# Patient Record
Sex: Female | Born: 1955 | Race: White | Hispanic: No | Marital: Single | Smoking: Light tobacco smoker
Health system: Southern US, Community
[De-identification: ages and names within clinical notes are randomized; demographics above are authoritative.]

## PROBLEM LIST (undated history)

## (undated) DIAGNOSIS — F329 Major depressive disorder, single episode, unspecified: Secondary | ICD-10-CM

## (undated) DIAGNOSIS — F418 Other specified anxiety disorders: Secondary | ICD-10-CM

## (undated) DIAGNOSIS — M1990 Unspecified osteoarthritis, unspecified site: Secondary | ICD-10-CM

## (undated) DIAGNOSIS — G4700 Insomnia, unspecified: Secondary | ICD-10-CM

## (undated) DIAGNOSIS — Z720 Tobacco use: Secondary | ICD-10-CM

## (undated) DIAGNOSIS — C55 Malignant neoplasm of uterus, part unspecified: Secondary | ICD-10-CM

## (undated) DIAGNOSIS — I10 Essential (primary) hypertension: Secondary | ICD-10-CM

## (undated) DIAGNOSIS — R51 Headache: Secondary | ICD-10-CM

## (undated) DIAGNOSIS — M109 Gout, unspecified: Secondary | ICD-10-CM

## (undated) DIAGNOSIS — F32A Depression, unspecified: Secondary | ICD-10-CM

## (undated) DIAGNOSIS — K219 Gastro-esophageal reflux disease without esophagitis: Secondary | ICD-10-CM

## (undated) DIAGNOSIS — Z8541 Personal history of malignant neoplasm of cervix uteri: Secondary | ICD-10-CM

## (undated) DIAGNOSIS — F1411 Cocaine abuse, in remission: Secondary | ICD-10-CM

## (undated) HISTORY — DX: Personal history of malignant neoplasm of cervix uteri: Z85.41

## (undated) HISTORY — DX: Depression, unspecified: F32.A

## (undated) HISTORY — DX: Other specified anxiety disorders: F41.8

## (undated) HISTORY — DX: Tobacco use: Z72.0

## (undated) HISTORY — DX: Gout, unspecified: M10.9

## (undated) HISTORY — DX: Gastro-esophageal reflux disease without esophagitis: K21.9

## (undated) HISTORY — DX: Essential (primary) hypertension: I10

## (undated) HISTORY — DX: Malignant neoplasm of uterus, part unspecified: C55

## (undated) HISTORY — DX: Insomnia, unspecified: G47.00

## (undated) HISTORY — DX: Major depressive disorder, single episode, unspecified: F32.9

## (undated) HISTORY — PX: PARTIAL HYSTERECTOMY: SHX80

## (undated) HISTORY — DX: Unspecified osteoarthritis, unspecified site: M19.90

## (undated) HISTORY — PX: SKIN GRAFT: SHX250

## (undated) HISTORY — DX: Cocaine abuse, in remission: F14.11

---

## 1983-05-09 DIAGNOSIS — Z8541 Personal history of malignant neoplasm of cervix uteri: Secondary | ICD-10-CM

## 1983-05-09 HISTORY — DX: Personal history of malignant neoplasm of cervix uteri: Z85.41

## 1998-01-07 ENCOUNTER — Emergency Department (HOSPITAL_COMMUNITY): Admission: EM | Admit: 1998-01-07 | Discharge: 1998-01-07 | Payer: Self-pay | Admitting: Emergency Medicine

## 1998-01-07 ENCOUNTER — Encounter: Payer: Self-pay | Admitting: Emergency Medicine

## 1998-12-12 ENCOUNTER — Emergency Department (HOSPITAL_COMMUNITY): Admission: EM | Admit: 1998-12-12 | Discharge: 1998-12-12 | Payer: Self-pay | Admitting: Emergency Medicine

## 1999-12-12 ENCOUNTER — Emergency Department (HOSPITAL_COMMUNITY): Admission: EM | Admit: 1999-12-12 | Discharge: 1999-12-12 | Payer: Self-pay | Admitting: Emergency Medicine

## 1999-12-19 ENCOUNTER — Emergency Department (HOSPITAL_COMMUNITY): Admission: EM | Admit: 1999-12-19 | Discharge: 1999-12-19 | Payer: Self-pay | Admitting: Emergency Medicine

## 2005-05-08 DIAGNOSIS — I10 Essential (primary) hypertension: Secondary | ICD-10-CM

## 2005-05-08 HISTORY — DX: Essential (primary) hypertension: I10

## 2005-08-26 ENCOUNTER — Inpatient Hospital Stay (HOSPITAL_COMMUNITY): Admission: EM | Admit: 2005-08-26 | Discharge: 2005-08-27 | Payer: Self-pay | Admitting: Emergency Medicine

## 2008-05-08 DIAGNOSIS — F418 Other specified anxiety disorders: Secondary | ICD-10-CM

## 2008-05-08 HISTORY — DX: Other specified anxiety disorders: F41.8

## 2008-09-30 ENCOUNTER — Emergency Department (HOSPITAL_COMMUNITY): Admission: EM | Admit: 2008-09-30 | Discharge: 2008-09-30 | Payer: Self-pay | Admitting: Family Medicine

## 2008-10-01 ENCOUNTER — Emergency Department (HOSPITAL_COMMUNITY): Admission: EM | Admit: 2008-10-01 | Discharge: 2008-10-01 | Payer: Self-pay | Admitting: Emergency Medicine

## 2008-10-15 ENCOUNTER — Encounter (INDEPENDENT_AMBULATORY_CARE_PROVIDER_SITE_OTHER): Payer: Self-pay | Admitting: Internal Medicine

## 2008-10-23 ENCOUNTER — Ambulatory Visit: Payer: Self-pay | Admitting: Internal Medicine

## 2008-10-23 ENCOUNTER — Ambulatory Visit (HOSPITAL_COMMUNITY): Admission: RE | Admit: 2008-10-23 | Discharge: 2008-10-23 | Payer: Self-pay | Admitting: Internal Medicine

## 2008-10-23 ENCOUNTER — Encounter (INDEPENDENT_AMBULATORY_CARE_PROVIDER_SITE_OTHER): Payer: Self-pay | Admitting: Internal Medicine

## 2008-10-23 DIAGNOSIS — R0989 Other specified symptoms and signs involving the circulatory and respiratory systems: Secondary | ICD-10-CM | POA: Insufficient documentation

## 2008-10-23 DIAGNOSIS — C539 Malignant neoplasm of cervix uteri, unspecified: Secondary | ICD-10-CM | POA: Insufficient documentation

## 2008-10-23 DIAGNOSIS — Z9189 Other specified personal risk factors, not elsewhere classified: Secondary | ICD-10-CM | POA: Insufficient documentation

## 2008-10-23 DIAGNOSIS — R0609 Other forms of dyspnea: Secondary | ICD-10-CM | POA: Insufficient documentation

## 2008-10-23 DIAGNOSIS — F418 Other specified anxiety disorders: Secondary | ICD-10-CM | POA: Insufficient documentation

## 2008-10-23 DIAGNOSIS — F17200 Nicotine dependence, unspecified, uncomplicated: Secondary | ICD-10-CM | POA: Insufficient documentation

## 2008-10-23 DIAGNOSIS — K219 Gastro-esophageal reflux disease without esophagitis: Secondary | ICD-10-CM | POA: Insufficient documentation

## 2008-10-23 DIAGNOSIS — F1411 Cocaine abuse, in remission: Secondary | ICD-10-CM

## 2008-10-23 DIAGNOSIS — I10 Essential (primary) hypertension: Secondary | ICD-10-CM | POA: Insufficient documentation

## 2008-10-23 DIAGNOSIS — G4700 Insomnia, unspecified: Secondary | ICD-10-CM | POA: Insufficient documentation

## 2008-10-23 HISTORY — DX: Cocaine abuse, in remission: F14.11

## 2010-05-31 ENCOUNTER — Emergency Department (HOSPITAL_COMMUNITY)
Admission: EM | Admit: 2010-05-31 | Discharge: 2010-05-31 | Payer: Self-pay | Source: Home / Self Care | Admitting: Emergency Medicine

## 2010-06-01 LAB — POCT I-STAT, CHEM 8
BUN: 12 mg/dL (ref 6–23)
Calcium, Ion: 1.22 mmol/L (ref 1.12–1.32)
Chloride: 105 mEq/L (ref 96–112)
Creatinine, Ser: 1 mg/dL (ref 0.4–1.2)
Glucose, Bld: 105 mg/dL — ABNORMAL HIGH (ref 70–99)
HCT: 46 % (ref 36.0–46.0)
Hemoglobin: 15.6 g/dL — ABNORMAL HIGH (ref 12.0–15.0)
Potassium: 4.4 mEq/L (ref 3.5–5.1)
Sodium: 139 mEq/L (ref 135–145)
TCO2: 28 mmol/L (ref 0–100)

## 2010-06-07 NOTE — Assessment & Plan Note (Signed)
Summary: ACUTE-PT TO SEE [facility]/REF BY URGENT CAR OKAY PER DORIS/[facility]   Vital Signs:  Patient profile:   55 year old female Height:      62 inches (157.48 cm) Weight:      171.1 pounds (77.77 kg) BMI:     31.41 Temp:     97.9 degrees F Pulse rate:   72 / minute BP sitting:   167 / 94  (right arm) Cuff size:   regular  Vitals Entered By: Dorie Rank RN (October 23, 2008 9:59 AM) CC: ED and HFU - gets so hot at night and sweating, gets h/a most nights - "so stressed ...my nerves are shot" - sister's dtr was murdered in Dec.and still dealing with stress- has not been able to find MD to help her get her b/p under control  Is Patient Diabetic? No Pain Assessment Patient in pain? yes     Location: headache Intensity: 7 Type: throbbing Nutritional Status BMI of > 30 = obese  Does patient need assistance? Functional Status Self care Ambulation Normal Comments nerves shot because [employer] gave her wrong pills and she took 2 pills and then ended up in ED   CC:  ED and HFU - gets so hot at night and sweating and gets h/a most nights - "so stressed ...my nerves are shot" - sister's dtr was murdered in Dec.and still dealing with stress- has not been able to find MD to help her get her b/p under control .  History of Present Illness: Lori [name] is a 55 yo lady is new to the clinic.Her PMH is significant for HTN crisis for which she was admitted on 2007, 33 years of tobacco abuse, h/o cocaine abuse, cervical cancer s/p hysterectomy at the age of 55, GERD and a recent ED visit for adverse drug reaction.   1) HTN: Pt. was first diagnosed with HTN in 2007, when she was admitted for HTN crisis. Pt. was initially started on 2 meds when she left the hospital. She took these meds probably for a month. Pt. was started on clonidine few wks ago when she went to urgent care. According to the pt. and her daughter's description, she was given a wrong medication. After taking this medication she had abnormal  reaction- she had rash on her arms, face, neck and upper torso. She was also short of breath and was feeling sick. She came to ED and she brought her meds and a/t ED note that was lisinopril-hctz. Since then she started taking clonidine. It makes the pt. sick and dizzy and lose her balance. But, eventually these symptom wears off. She doesn't have any rash now.   2) Tobacco abuse: She currently smokes half a pack per day.   3) GERD: It's very bad. She can't eat anything. It burns on her LUQ.   4) Cocaine abuse: A/t the pt. this was back in old days and she no longer uses it. Last documented use was in 2007 when she came with HTN crisis.   5) Cervical cancer: Pt. is not following any GYN MD regularly.   6) Anxiety: Pt. had 5 deaths in last 2 years. She lost her father, brother, uncle. Her sister's daughter was stabbed. Last death was his father about a year ago. SHe is not sure what to do. She stays in the room and watch TV all day long. Pt. has no motivation to do anything. Pt. used to enjoy fishing and still does. Her mood is not good. She cries  sometimes. No SI/HI. 7) Insomnia: Pt. has problem going to sleep, staying asleep and early morning awakeinings. Her typical night is as follow. She takes her dinner at 4-5 PM. At night she go to bed at 9 AM and watch TV from bed. Her hands and legs go to sleep while she is in bed. Pt. can't go to sleep. She tosses and turns. She gets HA as she is stressed and tired. Then she tries to turn tv off. She wakes up multiple times at night to go to bathroom as she drinks a lot of water. She may eventually get sleep for short time. Pt's daughter said the pt. snores really loud and bad. Pt. walks about a mile every day at 7 PM before she goes to sleep. Pt. doesn't drink alchohal. No other substance. Pt. feels like she needs to take nap at daytime as she didn't get enought sleep at night. But pt. doesn't take nap.   Preventive Screening-Counseling &  Management  Alcohol-Tobacco     Smoking Status: current     Smoking Cessation Counseling: yes     Packs/Day: 0.5  Comments: has reduced smoking by 1/2 ppd- wants to quit smoking  Current Medications (verified): 1)  Prilosec 40 Mg Cpdr (Omeprazole) .... Take 1 Pill By Mouth Daily. 2)  Norvasc 5 Mg Tabs (Amlodipine Besylate) .... Take 1 Pill By Mouth Daily. 3)  Ventolin Hfa 108 (90 Base) Mcg/act Aers (Albuterol Sulfate) .... Use 2 Puffs As Needed For Any Difficulty Breathing Up To Every 4 Hourly. 4)  Spiriva Handihaler 18 Mcg Caps (Tiotropium Bromide Monohydrate) .... Use 1 Capsule For Inhalation Every Day. 5)  Amitriptyline Hcl 25 Mg Tabs (Amitriptyline Hcl) .... Take 1 Tablet At Bedtime 6)  Nicoderm Cq 14 Mg/24hr Pt24 (Nicotine) .... Apply 1 Patch Daily.  Allergies (verified): 1)  ! Zestoretic (Lisinopril-Hydrochlorothiazide)  Social History: Smoking Status:  current Packs/Day:  0.5  Review of Systems      See HPI  Physical Exam  General:  alert.   Mouth:  pharynx pink and moist.   Lungs:  normal breath sounds and no crackles.  Scattered wheeze on both lungs.  Heart:  normal rate, regular rhythm, no murmur, no gallop, and no rub.   Abdomen:  soft, normal bowel sounds, no distention, no masses, no guarding, no rigidity, and no rebound tenderness.  Mild epigastric tenderness.  Extremities:  trace left pedal edema and trace right pedal edema.   Neurologic:  alert & oriented X3.   Psych:  memory intact for recent and remote, normally interactive, good eye contact, not depressed appearing, not agitated, and slightly anxious.     Impression & Recommendations:  Problem # 1:  GERD (ICD-530.81) I will treat this with Omeprazole.  Her updated medication list for this problem includes:    Prilosec 40 Mg Cpdr (Omeprazole) .Marland Kitchen... Take 1 pill by mouth daily.  Problem # 2:  SNORING (ICD-786.09) Pt. has insomnia, daytime sleepiness and daughter noticed pt. snores v. loudly. Will get  followings.  Her updated medication list for this problem includes:    Ventolin Hfa 108 (90 Base) Mcg/act Aers (Albuterol sulfate) ..... Use 2 puffs as needed for any difficulty breathing up to every 4 hourly.    Spiriva Handihaler 18 Mcg Caps (Tiotropium bromide monohydrate) ..... Use 1 capsule for inhalation every day.  Orders: Diagnostic Polysomnogram (Diagnostic Polysomno)  Problem # 3:  CERVICAL CANCER (ICD-180.9) Pt. hadn't had any f/u for this. She is s/p hysterectomy at the age  of 28. After pt's other active issues are settled, I will refer her to GYN.  Orders: T-Comprehensive Metabolic Panel ([id number])  Problem # 4:  COCAINE ABUSE, HX OF (ICD-V15.9) I will check UDS today. She said she used cocaine only remotely.  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 Her 12 lead EKG is WNL. I will treat this will low dose amitryptiiline. This will also help her sleep. Will follow closely and if needed we will increase the dose in 4-6 wks.  Her updated medication list for this problem includes:    Amitriptyline Hcl 25 Mg Tabs (Amitriptyline hcl) .Marland Kitchen... Take 1 tablet at bedtime  Orders: 12 Lead EKG (12 Lead EKG)  Problem # 6:  TOBACCO ABUSE (ICD-305.1) SPoke with the pt. at length about the importance of smoking cessation. I would be reluctant to start her on chantix now because she is little depressed but not suicidal though. I will try nicotine patch. If she has problem affording this and her depression improves, I will start chantix, as she can get it from [facility]. Pt. has some wheezing and longstanding h/o smoking. She also had some COPD like changes on CXR. I will get PFT when her acute/subacute problems are under control. For now I will start her on spiriva and as needed albuterol.  Her updated medication list for this problem includes:    Nicoderm Cq 14 Mg/24hr Pt24 (Nicotine) .Marland Kitchen... Apply 1 patch  daily.  Problem # 7:  INSOMNIA (ICD-780.52)  This is multifactorial. I think the pt. needs to improve her sleep hygeine. I had v. long discussion regarding this. She should move her TV out of her bedroom. I also encouraged her not to exercise before she goes to bed: no empty stomach while she goes to bed. No reading, eating, watching TV from bed. Quitting smoking would also help. I am starting amitryptilline for depression and hopefully this will also help for sleep. Will reassess this on next visit.   Orders: T-TSH ([id number])  Problem # 8:  HYPERTENSION (ICD-401.9) I d/ced pt's clonidine, as she is no longer taking it. Pt. is allergic to hctz-lisinopril combo pill. I will treat this with norvasc. Discussed about low salt and wt. loss. Pt. didn't go to lab for CMET, TSH and UDS. This should be checked on next visit in 2 wks. If BP is not controlled optimally by then, I will increase the dose of norvasc to 10 mg.  Her updated medication list for this problem includes:    Norvasc 5 Mg Tabs (Amlodipine besylate) .Marland Kitchen... Take 1 pill by mouth daily.  Orders: T-Comprehensive Metabolic Panel [id number]) T-TSH [phone])  BP today: 167/94  Complete Medication List: 1)  Prilosec 40 Mg Cpdr (Omeprazole) .... Take 1 pill by mouth daily. 2)  Norvasc 5 Mg Tabs (Amlodipine besylate) .... Take 1 pill by mouth daily. 3)  Ventolin Hfa 108 (90 Base) Mcg/act Aers (Albuterol sulfate) .... Use 2 puffs as needed for any difficulty breathing up to every 4 hourly. 4)  Spiriva Handihaler 18 Mcg Caps (Tiotropium bromide monohydrate) .... Use 1 capsule for inhalation every day. 5)  Amitriptyline Hcl 25 Mg Tabs (Amitriptyline hcl) .... Take 1 tablet at bedtime 6)  Nicoderm Cq 14 Mg/24hr Pt24 (Nicotine) .... Apply 1 patch daily.  Patient Instructions: 1)  Don't smoke when you are using nicotine patch.  2)  Please schedule a follow-up appointment in 2 weeks. 3)  Limit your Sodium (  Salt). 4)  Limit your  Sodium (Salt) to less than 2 grams a day(slightly less than 1/2 a teaspoon) to prevent fluid retention, swelling, or worsening of symptoms. 5)  Tobacco is very bad for your health and your loved ones! You Should stop smoking!. 6)  Stop Smoking Tips: Choose a Quit date. Cut down before the Quit date. decide what you will do as a substitute when you feel the urge to smoke(gum,toothpick,exercise). 7)  It is important that you exercise regularly at least 20 minutes 5 times a week. If you develop chest pain, have severe difficulty breathing, or feel very tired , stop exercising immediately and seek medical attention. 8)  You need to lose weight. Consider a lower calorie diet and regular exercise.  9)  Check your Blood Pressure regularly. If it is above: you should make an appointment. Prescriptions: NICODERM CQ 14 MG/24HR PT24 (NICOTINE) apply 1 patch daily.  #30 x 6   Entered and Authorized by:   Jason Coop MD   Signed by:   Jason Coop MD on 10/23/2008   Method used:   Print then Give to Patient   RxID:   [id number] AMITRIPTYLINE HCL 25 MG TABS (AMITRIPTYLINE HCL) take 1 tablet at bedtime  #30 x 0   Entered and Authorized by:   Jason Coop MD   Signed by:   Jason Coop MD on 10/23/2008   Method used:   Print then Give to Patient   RxID:   [id number] SPIRIVA HANDIHALER 18 MCG CAPS (TIOTROPIUM BROMIDE MONOHYDRATE) use 1 capsule for inhalation every day.  #1 x 11   Entered and Authorized by:   Jason Coop MD   Signed by:   Jason Coop MD on 10/23/2008   Method used:   Print then Give to Patient   RxID:   [id number] VENTOLIN HFA 108 (90 BASE) MCG/ACT AERS (ALBUTEROL SULFATE) use 2 puffs as needed for any difficulty breathing up to every 4 hourly.  #1 x prn   Entered and Authorized by:   Jason Coop MD   Signed by:   Jason Coop MD on 10/23/2008   Method used:   Print then Give to Patient   RxID:   [id number] NORVASC  5 MG TABS (AMLODIPINE BESYLATE) take 1 pill by mouth daily.  #30 x 0   Entered and Authorized by:   Jason Coop MD   Signed by:   Jason Coop MD on 10/23/2008   Method used:   Print then Give to Patient   RxID:   [id number] PRILOSEC 40 MG CPDR (OMEPRAZOLE) take 1 pill by mouth daily.  #30 x 5   Entered and Authorized by:   Jason Coop MD   Signed by:   Jason Coop MD on 10/23/2008   Method used:   Print then Give to Patient   RxID:   [id number]

## 2010-06-07 NOTE — Miscellaneous (Signed)
Summary: HIPAA Restrictions  HIPAA Restrictions   Imported By: Florinda Marker 10/23/2008 15:58:10  _____________________________________________________________________  External Attachment:    Type:   Image     Comment:   External Document

## 2010-06-20 ENCOUNTER — Other Ambulatory Visit (HOSPITAL_COMMUNITY)
Admission: RE | Admit: 2010-06-20 | Discharge: 2010-06-20 | Disposition: A | Discharge status: Home / Self Care | Payer: Self-pay | Source: Ambulatory Visit | Attending: Internal Medicine | Admitting: Internal Medicine

## 2010-06-20 ENCOUNTER — Encounter: Payer: Self-pay | Admitting: Internal Medicine

## 2010-06-20 ENCOUNTER — Ambulatory Visit (INDEPENDENT_AMBULATORY_CARE_PROVIDER_SITE_OTHER): Payer: Self-pay | Admitting: Internal Medicine

## 2010-06-20 DIAGNOSIS — G4700 Insomnia, unspecified: Secondary | ICD-10-CM

## 2010-06-20 DIAGNOSIS — I10 Essential (primary) hypertension: Secondary | ICD-10-CM

## 2010-06-20 DIAGNOSIS — M79609 Pain in unspecified limb: Secondary | ICD-10-CM

## 2010-06-20 DIAGNOSIS — F329 Major depressive disorder, single episode, unspecified: Secondary | ICD-10-CM

## 2010-06-20 DIAGNOSIS — Z01419 Encounter for gynecological examination (general) (routine) without abnormal findings: Secondary | ICD-10-CM | POA: Insufficient documentation

## 2010-06-20 DIAGNOSIS — C539 Malignant neoplasm of cervix uteri, unspecified: Secondary | ICD-10-CM

## 2010-06-20 DIAGNOSIS — K759 Inflammatory liver disease, unspecified: Secondary | ICD-10-CM

## 2010-06-20 DIAGNOSIS — F17200 Nicotine dependence, unspecified, uncomplicated: Secondary | ICD-10-CM

## 2010-06-20 DIAGNOSIS — K219 Gastro-esophageal reflux disease without esophagitis: Secondary | ICD-10-CM

## 2010-06-20 DIAGNOSIS — M79606 Pain in leg, unspecified: Secondary | ICD-10-CM

## 2010-06-20 LAB — COMPREHENSIVE METABOLIC PANEL
ALT: 11 U/L (ref 0–35)
AST: 15 U/L (ref 0–37)
Albumin: 4.7 g/dL (ref 3.5–5.2)
Alkaline Phosphatase: 73 U/L (ref 39–117)
BUN: 13 mg/dL (ref 6–23)
CO2: 26 mEq/L (ref 19–32)
Calcium: 9.9 mg/dL (ref 8.4–10.5)
Chloride: 103 mEq/L (ref 96–112)
Creat: 0.9 mg/dL (ref 0.40–1.20)
Glucose, Bld: 99 mg/dL (ref 70–99)
Potassium: 4.8 mEq/L (ref 3.5–5.3)
Sodium: 140 mEq/L (ref 135–145)
Total Bilirubin: 0.4 mg/dL (ref 0.3–1.2)
Total Protein: 7.6 g/dL (ref 6.0–8.3)

## 2010-06-20 LAB — TSH: TSH: 0.758 u[IU]/mL (ref 0.350–4.500)

## 2010-06-20 MED ORDER — ZOLPIDEM TARTRATE 10 MG PO TABS: 10.0000 mg | ORAL_TABLET | Freq: Every evening | ORAL | Status: DC | PRN

## 2010-06-20 MED ORDER — OMEPRAZOLE 40 MG PO CPDR: 40.0000 mg | DELAYED_RELEASE_CAPSULE | Freq: Every day | ORAL | Status: DC

## 2010-06-20 MED ORDER — LISINOPRIL 20 MG PO TABS
20.0000 mg | ORAL_TABLET | Freq: Every day | ORAL | Status: DC
Start: 2010-06-20 — End: 2010-10-25

## 2010-06-20 MED ORDER — HYDROCHLOROTHIAZIDE 12.5 MG PO CAPS: 12.5000 mg | ORAL_CAPSULE | Freq: Every day | ORAL | Status: DC

## 2010-06-20 MED ORDER — FLUOXETINE HCL (PMDD) 20 MG PO CAPS: 20.0000 mg | ORAL_CAPSULE | Freq: Every day | ORAL | Status: DC

## 2010-06-20 NOTE — Assessment & Plan Note (Signed)
That nausea  And episodes of emesis with abdominal pain are more likely due to GERD.  Patient was not taking omeprazole as prescribed in 2010.  I am considering to restart it.  To evaluate at the next office visit if symptoms improved. Also checking liver function test.

## 2010-06-20 NOTE — Patient Instructions (Addendum)
Call if you are no feeling better. Make an appointment with Dr Loistine Chance in 1 month.
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
## 2010-06-20 NOTE — Assessment & Plan Note (Signed)
His blood pressures was elevated at this office visit. I think there is a lot of anxiety which caused the elevated  Blood pressure. Therefore I will not change dosage at this time I will continue lisinopril and hydrochlorothiazide  at the same dose.  Consider to increase the dosage if blood pressure continues to be elevated. Patient had a history of hypertensive urgency in 2007 needed 2 blood pressure medications to control  her pressure is.

## 2010-06-20 NOTE — Assessment & Plan Note (Signed)
Patient noted hip pain and knee pain especially during the night she was using good he called her and Tylenol with no significant improvement. I discussed with the patient that this should be addressed at the next office visit since the depression is insomnia  I think was the main problem during this office visit. I noted that she needs to call if her pain worsen significantly. I would consider possible  Neurontin and imaging studies. Patient noted that her sister has arthritis.

## 2010-06-20 NOTE — Assessment & Plan Note (Signed)
I think is  Multifactorial.  Likely due to depression contribution to most do it. But also her sleep hygiene. I discussed with her improvement in her sleep hygiene. Furthermore I am considering that patient may have sleep apnea. She was referred in June of 2010 for a sleep study especially in the setting of severe snoring and episodes of apnea during  the night  but patient did not followup.  I will start her on Ambien and fluoxetine. I will refer the patient at the next office visit for his sleep study and manage accordingly.

## 2010-06-20 NOTE — Assessment & Plan Note (Signed)
Patient noted that her husband has hepatitis  but unclear what kind. I will check a hepatitis panel today and manage accordingly.

## 2010-06-20 NOTE — Assessment & Plan Note (Signed)
I think the patient has depression and anxiety. A lot of things are going on in her life. She discontinued amitriptyline 1-1/2 years ago I would start her on fluoxetine. This will also help her sleep. We'll followup closely and if needed we'll increase her dose. I recommended to take for 6 days half a pill and then increases to full dose. I advised her to call if she is not feeling better in 2 weeks.

## 2010-06-20 NOTE — Assessment & Plan Note (Addendum)
Patient had a history of subacute cancer at age 55 status post  partial hysterectomy.  Never followed up with OB/GYN. Last Pap smear 10 years ago. Will do Pap smear today and will refer patient to OB/GYN  at the next office visit.  Will refer patient to next office visit for a mammogram and bone scan.  The pap smear showed atypical squamous cells of undetermined significance (ASC-US). Will refer patient to OB/GYN.

## 2010-06-20 NOTE — Assessment & Plan Note (Signed)
Patient has a 35 year history of tobacco abuse.  She would like to quit. She was prescribed nicotine patches in 2010 but she was no longer able to  afford it.  She is currently smoking half a pack a day. She will she would like to quit and would like to have help.  I will refer the patient to  Social worker for smoking cessation.

## 2010-06-21 LAB — CBC
HCT: 38.4 % (ref 36.0–46.0)
Hemoglobin: 12.7 g/dL (ref 12.0–15.0)
MCH: 31.6 pg (ref 26.0–34.0)
MCHC: 33.1 g/dL (ref 30.0–36.0)
MCV: 95.5 fL (ref 78.0–100.0)
Platelets: 325 10*3/uL (ref 150–400)
RBC: 4.02 MIL/uL (ref 3.87–5.11)
RDW: 11.8 % (ref 11.5–15.5)
WBC: 8.8 10*3/uL (ref 4.0–10.5)

## 2010-06-21 LAB — HEPATITIS PANEL, ACUTE
HCV Ab: NEGATIVE
Hep A IgM: NEGATIVE
Hep B C IgM: NEGATIVE
Hepatitis B Surface Ag: NEGATIVE

## 2010-06-22 ENCOUNTER — Telehealth: Payer: Self-pay | Admitting: Licensed Clinical Social Worker

## 2010-06-22 NOTE — Telephone Encounter (Signed)
Pt scheduled for Monday 2/20 at noon for smoking cessation

## 2010-06-27 ENCOUNTER — Encounter: Payer: Self-pay | Admitting: Licensed Clinical Social Worker
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
## 2010-06-27 NOTE — Progress Notes (Signed)
Addended by: Almyra Deforest on: 06/27/2010 03:07 PM   Modules accepted: Orders

## 2010-06-30 NOTE — Progress Notes (Signed)
Completed.

## 2010-07-05 ENCOUNTER — Telehealth: Payer: Self-pay | Admitting: Licensed Clinical Social Worker

## 2010-07-05 NOTE — Telephone Encounter (Signed)
Patient called and said she never heard from the Quitline.  I called the Quitline and they advised that she call the Quitline again to reenroll.  I called the patient and apologized while encouraging her to reenroll so she can obtain counseling and free patches.

## 2010-07-20 ENCOUNTER — Other Ambulatory Visit: Payer: Self-pay | Admitting: *Deleted

## 2010-07-20 DIAGNOSIS — F329 Major depressive disorder, single episode, unspecified: Secondary | ICD-10-CM

## 2010-07-20 DIAGNOSIS — G4700 Insomnia, unspecified: Secondary | ICD-10-CM

## 2010-07-20 MED ORDER — ZOLPIDEM TARTRATE 10 MG PO TABS: 10.0000 mg | ORAL_TABLET | Freq: Every evening | ORAL | Status: DC | PRN

## 2010-07-20 MED ORDER — FLUOXETINE HCL (PMDD) 20 MG PO CAPS: 20.0000 mg | ORAL_CAPSULE | Freq: Every day | ORAL | Status: DC

## 2010-07-20 NOTE — Telephone Encounter (Signed)
Call to [facility] for refills on Ambien and Fluoxetine 20 mg tablets per order of Dr. Aundria Rud

## 2010-07-29 ENCOUNTER — Other Ambulatory Visit: Payer: Self-pay | Admitting: Advanced Practice Midwife

## 2010-07-29 ENCOUNTER — Encounter (INDEPENDENT_AMBULATORY_CARE_PROVIDER_SITE_OTHER): Payer: Self-pay | Admitting: Advanced Practice Midwife

## 2010-07-29 ENCOUNTER — Other Ambulatory Visit: Payer: Self-pay | Admitting: Obstetrics & Gynecology

## 2010-07-29 DIAGNOSIS — N949 Unspecified condition associated with female genital organs and menstrual cycle: Secondary | ICD-10-CM

## 2010-07-29 DIAGNOSIS — R87619 Unspecified abnormal cytological findings in specimens from cervix uteri: Secondary | ICD-10-CM

## 2010-07-29 DIAGNOSIS — R1031 Right lower quadrant pain: Secondary | ICD-10-CM
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
## 2010-08-08 ENCOUNTER — Ambulatory Visit (HOSPITAL_COMMUNITY): Admission: RE | Admit: 2010-08-08 | Payer: Self-pay | Source: Ambulatory Visit

## 2010-08-08 ENCOUNTER — Ambulatory Visit (HOSPITAL_COMMUNITY): Payer: Self-pay | Attending: Obstetrics & Gynecology

## 2010-08-15 ENCOUNTER — Ambulatory Visit: Payer: Self-pay | Admitting: Family Medicine

## 2010-08-16 LAB — POCT I-STAT, CHEM 8
BUN: 12 mg/dL (ref 6–23)
Calcium, Ion: 1.19 mmol/L (ref 1.12–1.32)
Chloride: 105 mEq/L (ref 96–112)
Creatinine, Ser: 1.2 mg/dL (ref 0.4–1.2)
Glucose, Bld: 127 mg/dL — ABNORMAL HIGH (ref 70–99)
HCT: 47 % — ABNORMAL HIGH (ref 36.0–46.0)
Hemoglobin: 16 g/dL — ABNORMAL HIGH (ref 12.0–15.0)
Potassium: 3.7 mEq/L (ref 3.5–5.1)
Sodium: 139 mEq/L (ref 135–145)
TCO2: 26 mmol/L (ref 0–100)

## 2010-08-16 LAB — POCT URINALYSIS DIP (DEVICE)
Bilirubin Urine: NEGATIVE
Glucose, UA: NEGATIVE mg/dL
Nitrite: NEGATIVE
Protein, ur: NEGATIVE mg/dL
Specific Gravity, Urine: 1.015 (ref 1.005–1.030)
Urobilinogen, UA: 0.2 mg/dL (ref 0.0–1.0)
pH: 5.5 (ref 5.0–8.0)

## 2010-08-22 ENCOUNTER — Ambulatory Visit (HOSPITAL_COMMUNITY): Admission: RE | Admit: 2010-08-22 | Payer: Self-pay | Source: Ambulatory Visit

## 2010-08-30 ENCOUNTER — Ambulatory Visit (HOSPITAL_COMMUNITY): Payer: Self-pay

## 2010-08-31 ENCOUNTER — Other Ambulatory Visit: Payer: Self-pay | Admitting: *Deleted

## 2010-08-31 DIAGNOSIS — F329 Major depressive disorder, single episode, unspecified: Secondary | ICD-10-CM

## 2010-08-31 DIAGNOSIS — G4700 Insomnia, unspecified: Secondary | ICD-10-CM

## 2010-09-02 MED ORDER — ZOLPIDEM TARTRATE 10 MG PO TABS: 10.0000 mg | ORAL_TABLET | Freq: Every evening | ORAL | Status: DC | PRN

## 2010-09-02 MED ORDER — FLUOXETINE HCL (PMDD) 20 MG PO CAPS: 20.0000 mg | ORAL_CAPSULE | Freq: Every day | ORAL | Status: AC

## 2010-09-02 NOTE — Telephone Encounter (Signed)
Rx called in 

## 2010-09-23 NOTE — H&P (Signed)
NAMEMarland Kitchen  Lori [name], Lori [name] NO.:  [id number]   MEDICAL RECORD NO.:  [id number]          PATIENT TYPE:  EMS   LOCATION:  [facility]                         FACILITY:  [facility]   PHYSICIAN:  Nelma Rothman, MD   DATE OF BIRTH:  02/27/1956   DATE OF ADMISSION:  08/26/2005  DATE OF DISCHARGE:                                HISTORY & PHYSICAL   PRIMARY CARE PHYSICIAN:  Dr. Talmadge Coventry, M.D..   CHIEF COMPLAINT:  Chest pain and headaches.   HISTORY OF PRESENT ILLNESS:  The patient is a 55 year old female with no  known past medical history, though she does not have a primary care  physician until today, presenting with headache and left-sided chest pain.  Both of these started early yesterday morning.  She went to Dr. Stephannie Peters  office, where her systolic blood pressure was greater than 200, so they told  her to come to the emergency department.  The headache has been pretty much  constant since that time, although was somewhat relieved with lowering of  her blood pressure in the emergency department.  The left-sided chest pain  is stuttering, also somewhat improved after lowering her blood pressure.  This chest pain was associated with some nausea as well as dizziness.  Again, the patient has no known history of hypertension or hyperlipidemia,  although has not had a doctor that she has seen in years.  She is a smoker  and admits to crack cocaine use, last 1 week ago.  In the emergency  department, she has received aspirin as well as IV metoprolol, normal saline  at 125 mL per hour and nitroglycerin drip currently running at 5 mcg per  minute.   PAST MEDICAL HISTORY:  Cervical cancer, status post hysterectomy at age 69.   ALLERGIES:  No known drug allergies.   MEDICATIONS:  Aspirin 325 mg p.o. daily.   SOCIAL HISTORY:  [place].  She  has an approximately 30-pack-year smoking history, currently smoking 1/2  pack per day.   She drinks alcohol occasionally, about once per week.  She  admits to crack cocaine use, last 1 week ago.   FAMILY HISTORY:  Both grandfathers died of cardiac complications in their  12s.  Father has had multiple heart attacks, at least 1 before age 46.  Her  mother died in an accident in her 59s and was relatively healthy at this  time.   REVIEW OF SYSTEMS:  Positive for some dizziness and nausea associated with  this chest pain and headaches.  She has not had any vomiting or diarrhea.  She also experiences hot flashes quite frequently.  She also complains of a  burning sensation in her stomach quite persistently for the past few months.  She says she has tried some over-the-counter medications, the names of which  she cannot recall at this time, without any relief.  Otherwise, 10-point  review of systems is negative.   PHYSICAL EXAM:  VITAL SIGNS:  Initially blood pressure 214/115 and is now  153/110, temperature 98.8, pulse 79, respiration  rate 22, oxygen saturation  99% on room air.  GENERAL:  She is in no acute distress.  HEENT:  Her mucous membranes are moist.  NECK:  Supple.  There is no jugular venous distension and no carotid bruits.  HEART:  Regular rate and rhythm with no murmurs, rubs or gallops.  LUNGS:  Clear to auscultation bilaterally.  ABDOMEN:  Diffusely tender to palpation, but is soft and non-distended and  she has normoactive bowel sounds.  There is no hepatosplenomegaly.  EXTREMITIES:  There is no edema.   ACCESSORY CLINICAL DATA:  EKG demonstrates normal sinus rhythm with a rate  of 77, is otherwise an essentially normal EKG with no ischemic changes.   Chest x-ray demonstrates nothing acute, raises the question of COPD, and  there is some left pleural thickening versus trace pleural fluid.   CT of the head without contrast is negative.   LABORATORY DATA:  D-dimer is less than 0.22.  Point-of-care cardiac markers  in the emergency department are negative x3.   Hemoglobin 15.0, hematocrit  44.0.  Sodium 139, potassium 3.8, chloride 108, bicarb 10, BUN 1.0 and  glucose is 101.   ASSESSMENT/PLAN:  1.  Hypertensive urgency.  I suspect she has undiagnosed hypertension and      recent cocaine use is certainly not helping things.  I plan to      discontinue the nitroglycerin drip at this time, as she is only on 5 mcg      per minute and she is currently complaining of significant headache.  I      will start hydrochlorothiazide as well as an ACE inhibitor with first      dose now.  We will use a nonselective beta blocker such as labetalol as      needed for systolic blood pressure greater than 180 or diastolic blood      pressure greater than 100 while she is here in the hospital.  I doubt      this reflects an acute myocardial infarction , but we will follow her      enzymes out.  We will check a TSH as well as a fasting lipid profile and      hemoglobin A1c for further risk stratification.  We will continue to use      sublingual nitroglycerin as needed, should her chest pain recur.  2.  Gastroesophageal reflux disease.  We will do a trial of proton pump      inhibitor.      Nelma Rothman, MD  Electronically Signed     RAR/MEDQ  D:  08/26/2005  T:  08/26/2005  Job:  [id number]   cc:   Talmadge Coventry, M.D.  Fax: [phone]

## 2010-10-13 ENCOUNTER — Other Ambulatory Visit: Payer: Self-pay | Admitting: Internal Medicine

## 2010-10-13 DIAGNOSIS — F32A Depression, unspecified: Secondary | ICD-10-CM

## 2010-10-13 DIAGNOSIS — G4700 Insomnia, unspecified: Secondary | ICD-10-CM

## 2010-10-13 DIAGNOSIS — F329 Major depressive disorder, single episode, unspecified: Secondary | ICD-10-CM

## 2010-10-17 NOTE — Telephone Encounter (Signed)
Ambien and Prozac called to [facility] pharmacy.

## 2010-10-25 ENCOUNTER — Ambulatory Visit (INDEPENDENT_AMBULATORY_CARE_PROVIDER_SITE_OTHER): Payer: Self-pay | Admitting: Internal Medicine

## 2010-10-25 ENCOUNTER — Encounter: Payer: Self-pay | Admitting: Internal Medicine

## 2010-10-25 DIAGNOSIS — F329 Major depressive disorder, single episode, unspecified: Secondary | ICD-10-CM

## 2010-10-25 DIAGNOSIS — Z299 Encounter for prophylactic measures, unspecified: Secondary | ICD-10-CM

## 2010-10-25 DIAGNOSIS — M25519 Pain in unspecified shoulder: Secondary | ICD-10-CM

## 2010-10-25 DIAGNOSIS — G4700 Insomnia, unspecified: Secondary | ICD-10-CM

## 2010-10-25 DIAGNOSIS — R109 Unspecified abdominal pain: Secondary | ICD-10-CM

## 2010-10-25 DIAGNOSIS — C539 Malignant neoplasm of cervix uteri, unspecified: Secondary | ICD-10-CM

## 2010-10-25 DIAGNOSIS — F3289 Other specified depressive episodes: Secondary | ICD-10-CM

## 2010-10-25 DIAGNOSIS — F17200 Nicotine dependence, unspecified, uncomplicated: Secondary | ICD-10-CM

## 2010-10-25 DIAGNOSIS — M79609 Pain in unspecified limb: Secondary | ICD-10-CM

## 2010-10-25 DIAGNOSIS — I10 Essential (primary) hypertension: Secondary | ICD-10-CM

## 2010-10-25 DIAGNOSIS — M25512 Pain in left shoulder: Secondary | ICD-10-CM

## 2010-10-25 DIAGNOSIS — M79606 Pain in leg, unspecified: Secondary | ICD-10-CM

## 2010-10-25 LAB — BASIC METABOLIC PANEL
BUN: 17 mg/dL (ref 6–23)
CO2: 26 mEq/L (ref 19–32)
Calcium: 10 mg/dL (ref 8.4–10.5)
Chloride: 101 mEq/L (ref 96–112)
Creat: 0.91 mg/dL (ref 0.50–1.10)
Glucose, Bld: 112 mg/dL — ABNORMAL HIGH (ref 70–99)
Potassium: 4.3 mEq/L (ref 3.5–5.3)
Sodium: 136 mEq/L (ref 135–145)

## 2010-10-25 NOTE — Progress Notes (Signed)
Pt aware [facility] will call her with appt due to mammogram scholarship - faxed. Marvis Saefong RN  6.19/12 10AM

## 2010-10-25 NOTE — Assessment & Plan Note (Addendum)
1. Will refer the patient for Mammogram today. 2. Will defer discussion about colonoscopy for the next office visit. Patient has no orange card at this point. Patient was referred to [facility] for assistance.
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
## 2010-10-25 NOTE — Assessment & Plan Note (Signed)
Significant improvement with sleep on Ambien. Continue current regimen.

## 2010-10-25 NOTE — Assessment & Plan Note (Addendum)
Likely rotator cuff tear. Will refer to Sports medicine for further evaluation.   Patient called for increased pain. She would like to have something for pain until appointment with Sportsmedicine. She tried Aspirin, Tylenol, heating pats but nothing was working. I will provide Tramadol to control her pain.

## 2010-10-25 NOTE — Assessment & Plan Note (Signed)
The pain today is different form the  Last office visit. Patient is noted to have an achy and cramp like pain in the leg. Unclear at this point since it is only present on the left side. Will obtain CT abd/pelvis to evalute
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
## 2010-10-25 NOTE — Assessment & Plan Note (Signed)
Patient spoke with the social worker concerning smoking cessation. She is currently down to one cigarette a week he uses nicotine patch. She wants to cut down completely bed due to some stress in her relationship she does have trouble. She noted she will be trying. I congratulated her for the great work and encouraged her to continue so.

## 2010-10-25 NOTE — Assessment & Plan Note (Addendum)
Blood pressure low at this point. Patient noted some dizziness especially when getting up in the morning.  I will dc lisinopril at this point and follow up in her BP. If BP is elevated consider to restart Lisinopril with a lower dose at the next office visit.

## 2010-10-25 NOTE — Assessment & Plan Note (Signed)
Abdominal bloating and nausea of unclear with unilateral leg pain unclear etiology. Patient had history of cervical cancer at age 55 with partial hysterectomy.Unlikely due to GERD. Will obtain CT abd/pelvis with oral contrast to evaluated any process. Will obtain UA although patient denies any significant symptoms for UTI.

## 2010-10-25 NOTE — Progress Notes (Signed)
Pt aware of CT scan [facility] 10/31/10 10AM - NPO 4 hours before test. Stanton Kidney Kwadwo Taras RN 10/25/10 11:15AM

## 2010-10-26 LAB — URINALYSIS, ROUTINE W REFLEX MICROSCOPIC
Bilirubin Urine: NEGATIVE
Glucose, UA: NEGATIVE mg/dL
Hgb urine dipstick: NEGATIVE
Ketones, ur: NEGATIVE mg/dL
Leukocytes, UA: NEGATIVE
Nitrite: NEGATIVE
Protein, ur: NEGATIVE mg/dL
Specific Gravity, Urine: 1.011 (ref 1.005–1.030)
Urobilinogen, UA: 0.2 mg/dL (ref 0.0–1.0)
pH: 6.5 (ref 5.0–8.0)

## 2010-10-31 ENCOUNTER — Telehealth: Payer: Self-pay | Admitting: Licensed Clinical Social Worker

## 2010-10-31 ENCOUNTER — Telehealth: Payer: Self-pay | Admitting: *Deleted

## 2010-10-31 ENCOUNTER — Ambulatory Visit (HOSPITAL_COMMUNITY)
Admission: RE | Admit: 2010-10-31 | Discharge: 2010-10-31 | Disposition: A | Discharge status: Home / Self Care | Payer: Self-pay | Source: Ambulatory Visit | Attending: Internal Medicine | Admitting: Internal Medicine

## 2010-10-31 DIAGNOSIS — R109 Unspecified abdominal pain: Secondary | ICD-10-CM | POA: Insufficient documentation

## 2010-10-31 DIAGNOSIS — Z8541 Personal history of malignant neoplasm of cervix uteri: Secondary | ICD-10-CM | POA: Insufficient documentation

## 2010-10-31 DIAGNOSIS — I700 Atherosclerosis of aorta: Secondary | ICD-10-CM | POA: Insufficient documentation

## 2010-10-31 DIAGNOSIS — N289 Disorder of kidney and ureter, unspecified: Secondary | ICD-10-CM | POA: Insufficient documentation

## 2010-10-31 NOTE — Telephone Encounter (Signed)
Returned pt.'s call. Pt states she is having a lot of pain; c/o pain her arm/leg - #8 on scale of 10.  States pain has been going on for 2 weeks but lately it has been "all the time";she cannot sleep. Request something until her appt w/Sports Medicine 11/07/10. States she has tried Tylenol, Advil, ASA, heating pad, and ice packs.  thanks

## 2010-11-01 MED ORDER — TRAMADOL HCL 50 MG PO TABS: 50.0000 mg | ORAL_TABLET | Freq: Four times a day (QID) | ORAL | Status: DC | PRN

## 2010-11-01 NOTE — Telephone Encounter (Signed)
Talked to Dr. Loistine Chance; Tramadol rx called to [facility] on [address].  Pt was called, no answer - message left on machine.

## 2010-11-01 NOTE — Progress Notes (Signed)
Addended by: Almyra Deforest on: 11/01/2010 09:12 AM   Modules accepted: Orders

## 2010-11-07 ENCOUNTER — Encounter: Payer: Self-pay | Admitting: Internal Medicine

## 2010-11-07 ENCOUNTER — Encounter: Payer: Self-pay | Admitting: Family Medicine

## 2010-11-07 ENCOUNTER — Ambulatory Visit (INDEPENDENT_AMBULATORY_CARE_PROVIDER_SITE_OTHER): Payer: Self-pay | Admitting: Internal Medicine

## 2010-11-07 ENCOUNTER — Ambulatory Visit (INDEPENDENT_AMBULATORY_CARE_PROVIDER_SITE_OTHER): Payer: Self-pay | Admitting: Family Medicine

## 2010-11-07 DIAGNOSIS — F32A Depression, unspecified: Secondary | ICD-10-CM

## 2010-11-07 DIAGNOSIS — M76899 Other specified enthesopathies of unspecified lower limb, excluding foot: Secondary | ICD-10-CM

## 2010-11-07 DIAGNOSIS — M7071 Other bursitis of hip, right hip: Secondary | ICD-10-CM | POA: Insufficient documentation

## 2010-11-07 DIAGNOSIS — M25519 Pain in unspecified shoulder: Secondary | ICD-10-CM

## 2010-11-07 DIAGNOSIS — I10 Essential (primary) hypertension: Secondary | ICD-10-CM

## 2010-11-07 DIAGNOSIS — R109 Unspecified abdominal pain: Secondary | ICD-10-CM

## 2010-11-07 DIAGNOSIS — M7500 Adhesive capsulitis of unspecified shoulder: Secondary | ICD-10-CM

## 2010-11-07 DIAGNOSIS — F329 Major depressive disorder, single episode, unspecified: Secondary | ICD-10-CM

## 2010-11-07 DIAGNOSIS — M79606 Pain in leg, unspecified: Secondary | ICD-10-CM

## 2010-11-07 DIAGNOSIS — M25512 Pain in left shoulder: Secondary | ICD-10-CM

## 2010-11-07 DIAGNOSIS — M79609 Pain in unspecified limb: Secondary | ICD-10-CM

## 2010-11-07 MED ORDER — FLUOXETINE HCL 40 MG PO CAPS: 40.0000 mg | ORAL_CAPSULE | Freq: Every day | ORAL | Status: DC

## 2010-11-07 MED ORDER — LORAZEPAM 0.5 MG PO TABS: 0.5000 mg | ORAL_TABLET | Freq: Two times a day (BID) | ORAL | Status: AC | PRN

## 2010-11-07 MED ORDER — HYDROCODONE-ACETAMINOPHEN 5-325 MG PO TABS: ORAL_TABLET | ORAL | Status: DC

## 2010-11-07 NOTE — Progress Notes (Signed)
  Subjective:    Patient ID: Lori [name], female    DOB: 08-07-1955, 55 y.o.   MRN: [id number]  HPI  New patient with complaint of 3 weeks of severe left shoulder pain. No specific injury, just awoke one morning with pain. It has gotten progressively worse. She can barely for shoulder now. She cannot sleep on that side at night. She is right-hand dominant. She has never had prior left shoulder injury and has had no left shoulder surgery. The pain radiates some into her left neck muscles. She's noted no weakness of her hand, no numbness.  Second issue is right hip and leg pain. This has been going on for about 3 months. Came on gradually. The pain is now 8/10 and worse at night. It is an aching type pain that radiates from the posterior right hip down to the lateral thigh and into the anterior lower leg. It does not travel to the foot. She's had no giving way of the knee or hip. She's had no numbness. No past history of right hip surgery or injury that she is aware of.   Review of Systems    Pertinent review of systems: negative for fever or unusual weight change.  Objective:   Physical Exam    GENERAL: Well-developed female no acute distress HIP: Internal and external rotation of the right hip is painless and full. She has normal hip flexor and extensor strength. Her thigh musculature goal is symmetrical and normal. She is somewhat tender to palpation over the right greater trochanter. Resisted hip abduction Is quite painful. Her strength is intact.  INJECTION: Patient was given informed consent, signed copy in the chart. Appropriate time out was taken. Area prepped and draped in usual sterile fashion. 1 cc of kenalog plus  9 cc of lidocaine was injected into the left subacromial bursa and the gleno-humeral joint ( 4 cc and 5 cc respectively)  using a(n) posterior approach. The patient tolerated the procedure well. There were no complications. Post procedure instructions were  given. INJECTION: Patient was given informed consent, signed copy in the chart. Appropriate time out was taken. Area prepped and draped in usual sterile fashion. 1 cc of kenalog plus  4 cc of lidocaine was injected into the right greater trochanteric bursa using a(n) lateral approach. The patient tolerated the procedure well. There were no complications. Post procedure instructions were given.    Assessment & Plan    #1 frozen shoulder. Left. Corticosteroid injection today we'll start her on Walt crawls. We'll give her Vicodin at night for pain and DC the tramadol.See her back in 2 weeks in which time we'll start her on rotator cuff exercises for the shoulder and probably give her a second shoulder injection.  #2 greater trochanteric bursitis. Corticosteroid injection today.

## 2010-11-08 NOTE — Assessment & Plan Note (Addendum)
Patient continues to have episodes of nausea and emesis most likely due to anxiety disorder. CT abd/pelivis was performed which is notable for 19 mm hyperdense left renal lesion, possibly proteinaceous cyst but incompletely characterized. A  renal MR with contrast to  exclude neoplasm was recommended per Radiology. No acute abdominal process. I will consider a renal ultrasound in around 3 month. Unlikely renal lesion causing her symptoms. If symptoms worsens consider further evaluation.

## 2010-11-08 NOTE — Assessment & Plan Note (Addendum)
Due to bursitis of right hip. Significantly improved after receiving steroid injection in right hip joint. Will follow up with Sports medicine.
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
## 2010-11-08 NOTE — Assessment & Plan Note (Signed)
BP well controlled. Continue current regimen.  BP Readings from Last 3 Encounters:  11/07/10 114/70  11/07/10 115/73  10/25/10 107/67

## 2010-11-08 NOTE — Assessment & Plan Note (Addendum)
Was noted to have Frozen shoulder. Significantly improved after receiving steroid injection by Sports medicine. Will follow up on a regular basis.

## 2010-11-15 ENCOUNTER — Telehealth: Payer: Self-pay | Admitting: Licensed Clinical Social Worker
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
## 2010-11-15 NOTE — Telephone Encounter (Signed)
Left another message today 11/15/10.   Had been on vacation previously.  Lori [name]

## 2010-11-17 ENCOUNTER — Other Ambulatory Visit: Payer: Self-pay | Admitting: Internal Medicine

## 2010-11-17 DIAGNOSIS — Z1231 Encounter for screening mammogram for malignant neoplasm of breast: Secondary | ICD-10-CM

## 2010-11-21 ENCOUNTER — Ambulatory Visit (INDEPENDENT_AMBULATORY_CARE_PROVIDER_SITE_OTHER): Payer: Self-pay | Admitting: Family Medicine

## 2010-11-21 ENCOUNTER — Encounter: Payer: Self-pay | Admitting: Family Medicine

## 2010-11-21 VITALS — BP 121/73 | HR 65

## 2010-11-21 DIAGNOSIS — M7500 Adhesive capsulitis of unspecified shoulder: Secondary | ICD-10-CM

## 2010-11-21 DIAGNOSIS — M7071 Other bursitis of hip, right hip: Secondary | ICD-10-CM

## 2010-11-21 DIAGNOSIS — M76899 Other specified enthesopathies of unspecified lower limb, excluding foot: Secondary | ICD-10-CM

## 2010-11-21 NOTE — Patient Instructions (Addendum)
I am starting you on a new medicine called gabapentin. I think it will help with the arm and shoulder pain. It takes a months to taper it up to a starting dose. You may see some relief initially but it should continue to improve as we move the dose upward. It is Ok to take your other pain medicines (hydrocodone) as you need to until I see you back. At that time we can discontinue them  Let me see you in 4 weeks or so.  Taper your medicine (GABAPENTIN 100 mg tabs) up by starting on one tab at night. Follow this taper up. Day 1-3 at one tab at night . Day 4-6 two tabs at night Days 7-10 three tabs at night Days 11-14 three tabs at night and one tab in am Days 15-18  Three tabs at night and two tabs in am Days 19-21 three tabs at night and three in am Day 22-25 three tabs at night, three tabs in am and one tab at lunch Days 26-29 three tabs at night, three tabs in am and two tab at lunch Days 30 until I see you back Three tans at night, three in the am and three at lunch

## 2010-11-22 ENCOUNTER — Other Ambulatory Visit: Payer: Self-pay | Admitting: *Deleted

## 2010-11-22 DIAGNOSIS — M25519 Pain in unspecified shoulder: Secondary | ICD-10-CM

## 2010-11-22 DIAGNOSIS — I10 Essential (primary) hypertension: Secondary | ICD-10-CM

## 2010-11-22 MED ORDER — HYDROCHLOROTHIAZIDE 12.5 MG PO CAPS: 12.5000 mg | ORAL_CAPSULE | Freq: Every day | ORAL | Status: DC

## 2010-11-22 MED ORDER — HYDROCODONE-ACETAMINOPHEN 5-325 MG PO TABS: ORAL_TABLET | ORAL | Status: DC

## 2010-11-22 MED ORDER — GABAPENTIN 100 MG PO TABS: ORAL_TABLET | ORAL | Status: DC

## 2010-11-22 NOTE — Telephone Encounter (Signed)
Called to pharm 

## 2010-11-22 NOTE — Progress Notes (Signed)
  Subjective:    Patient ID: Lori [name], female    DOB: 1956/03/25, 55 y.o.   MRN: [id number]  HPI #1: Followup left shoulder pain. Was better for about a week after the injection then returned to preinjection pain level. She's not been able to do much of the exercises secondary to pain it particularly bothers her at night and she's not been able to get a good nights rest in several months. Pain radiates up into the left neck and down into the left forearm it is centered mostly in the joint. It is worse with motion particularly reaching forward backward her of her head.  #2: Followup right hip pain after steroid injection that is completely resolved.   Review of Systems    denies fever, no unusual weight change. Objective:   Physical Exam    Shoulders without obvious defect.Left : Full strength in:              Internal rotation               External rotation               Supraspinatus testing Pain with supraspinatus testing and IR.  ROM is decreased unable to elevate past 100 degrees forward felxion, 90 degrees lateral abduction. IR can place hand to side of hip, not behind back. Distally neurovasculalry intact Impingement signs are positive   INJECTION: Patient was given informed consent, signed copy in the chart. Appropriate time out was taken. Area prepped and draped in usual sterile fashion. 1 cc of kenalog plus  9 cc of lidocaine was injected into the left subacromial bursa and GH joint using a(n) posterior approach. The patient tolerated the procedure well. There were no complications. Post procedure instructions were given.   Assessment & Plan:  . #1. Frozen shoulder on the left with small amount of improvement. We did setting corticosteroid injection today with high volume injected both the subacromial bursa and the glenohumeral joint. I reinforced as the importance of her exercises. She's having so much pain we'll start her on gabapentin with taper up to 900 mg a day. I'll see  her back in one month. #2. Greater trochanteric bursitis resolved.

## 2010-11-28 ENCOUNTER — Ambulatory Visit (HOSPITAL_COMMUNITY)
Admission: RE | Admit: 2010-11-28 | Discharge: 2010-11-28 | Disposition: A | Discharge status: Home / Self Care | Payer: Self-pay | Source: Ambulatory Visit | Attending: Internal Medicine | Admitting: Internal Medicine

## 2010-11-28 DIAGNOSIS — Z1231 Encounter for screening mammogram for malignant neoplasm of breast: Secondary | ICD-10-CM

## 2010-12-12 ENCOUNTER — Encounter: Payer: Self-pay | Admitting: Internal Medicine

## 2010-12-15 ENCOUNTER — Telehealth: Payer: Self-pay | Admitting: *Deleted

## 2010-12-15 NOTE — Telephone Encounter (Signed)
Returned pt's call. Stated she felled down 2-3 steps and hit her "tail bone"; has a bruised. Requesting something for the pain. She does not think she broke anything; she just want enough medication for a week. Also stated she will go to the ED if the pain/her condition become worse. Norco was ordered by Dr. Jennette [facility] 11[facility]; she does not have any left. Stated she cannot take Tramadol.  [facility] on [address]. Please advise   Thanks

## 2010-12-16 ENCOUNTER — Telehealth: Payer: Self-pay | Admitting: Internal Medicine

## 2010-12-16 ENCOUNTER — Other Ambulatory Visit: Payer: Self-pay | Admitting: *Deleted

## 2010-12-16 DIAGNOSIS — M25519 Pain in unspecified shoulder: Secondary | ICD-10-CM

## 2010-12-16 MED ORDER — HYDROCODONE-ACETAMINOPHEN 5-325 MG PO TABS: ORAL_TABLET | ORAL | Status: DC

## 2010-12-16 NOTE — Telephone Encounter (Signed)
Ms Lori [name] called the clinic on 08/09 for Narco since she experienced a fall and has a bruise on the tail bone area. I tried to call the patient to receive more information  but patient was not available. I let a message to call the clinic as soon as possible. I will also try to call the patient again. I am hesitant to prescribe any pain medication at this point . She has an appointment with Dr Jennette Kettle [facility]) on 12/18/2009.

## 2010-12-16 NOTE — Telephone Encounter (Signed)
See Dr. Loistine Chance' note. I called pt; no answer;message left to call the clinic.

## 2010-12-16 NOTE — Progress Notes (Signed)
Pt states she fell yesterday on her buttocks and it is sore and bruised today.  States she is having significant pain in her shoulder as well, and is out of her vicodin.  She is scheduled for f/u 12/19/10, but is requesting refill today.  Dr. Jennette Kettle authorized refill. Pt notified.

## 2010-12-16 NOTE — Telephone Encounter (Signed)
Called three times and left a message to further evaluate patient's symptoms after the fall. Patient has not yet called the clinic back. Will continue to try.

## 2010-12-19 ENCOUNTER — Ambulatory Visit (INDEPENDENT_AMBULATORY_CARE_PROVIDER_SITE_OTHER): Payer: Self-pay | Admitting: Family Medicine

## 2010-12-19 VITALS — BP 143/77

## 2010-12-19 DIAGNOSIS — M7500 Adhesive capsulitis of unspecified shoulder: Secondary | ICD-10-CM

## 2010-12-19 DIAGNOSIS — S300XXA Contusion of lower back and pelvis, initial encounter: Secondary | ICD-10-CM | POA: Insufficient documentation

## 2010-12-19 MED ORDER — HYDROCODONE-ACETAMINOPHEN 5-325 MG PO TABS: ORAL_TABLET | ORAL | Status: DC

## 2010-12-19 MED ORDER — HYDROCODONE-ACETAMINOPHEN 5-325 MG PO TABS: 2.0000 | ORAL_TABLET | Freq: Three times a day (TID) | ORAL | Status: DC | PRN

## 2010-12-19 MED ORDER — CYCLOBENZAPRINE HCL 5 MG PO TABS: 5.0000 mg | ORAL_TABLET | Freq: Three times a day (TID) | ORAL | Status: AC | PRN

## 2010-12-19 NOTE — Progress Notes (Signed)
  Subjective:    Patient ID: Lori [name], female    DOB: December 12, 1955, 55 y.o.   MRN: [id number]  HPI  #1. Followup left shoulder pain. She is significantly better by about 50%. She still has some pain with lateral elevation and forward flexion but less than before. She is now able to do her exercises.  #2. Buttock pain. Larey Seat down the steps last Thursday landing on her buttocks. Significant pain. Her hip feels very stove .  Review of Systems    Pertinent review of systems: negative for fever or unusual weight change.  Objective:   Physical Exam  GENERAL: Well-developed female no acute distress Left shoulder: Painless forward flexion to about 110 and then she can further flex to 40 but has some mild pain. Lateral raise to 130 without pain and can move it on a 180 with some mild pain. Distally she is neurovascularly intact. BUTTOCKS:: Very large purplish bruise on her right buttock area. She is tender to palpation over the right SI joint and Lori [name] causes significant pain. Hip rotation internal and external is intact.  GAI T. antalgic     Assessment & Plan:  #1. Frozen shoulder improved. We'll have her do the exercises as much as possible and followup in 3-4 weeks. #2. Continues to have bruising to the right buttock area with some probable SI joint sprain. Recommended donut pillow. We'll increase her pain medicine coverage and give her some Flexeril for at night and see her back in 3 weeks. At that time we'll start her on some hip rehabilitation.

## 2011-01-02 ENCOUNTER — Ambulatory Visit: Payer: Self-pay | Admitting: Family Medicine

## 2011-01-10 ENCOUNTER — Other Ambulatory Visit: Payer: Self-pay | Admitting: *Deleted

## 2011-01-10 DIAGNOSIS — F32A Depression, unspecified: Secondary | ICD-10-CM

## 2011-01-10 DIAGNOSIS — F329 Major depressive disorder, single episode, unspecified: Secondary | ICD-10-CM

## 2011-01-10 DIAGNOSIS — I10 Essential (primary) hypertension: Secondary | ICD-10-CM

## 2011-01-10 DIAGNOSIS — G4700 Insomnia, unspecified: Secondary | ICD-10-CM

## 2011-01-11 MED ORDER — ZOLPIDEM TARTRATE 10 MG PO TABS: 10.0000 mg | ORAL_TABLET | Freq: Every evening | ORAL | Status: DC | PRN

## 2011-01-11 MED ORDER — FLUOXETINE HCL 40 MG PO CAPS: 40.0000 mg | ORAL_CAPSULE | Freq: Every day | ORAL | Status: DC

## 2011-01-11 MED ORDER — HYDROCHLOROTHIAZIDE 12.5 MG PO CAPS: 12.5000 mg | ORAL_CAPSULE | Freq: Every day | ORAL | Status: DC

## 2011-01-12 NOTE — Telephone Encounter (Signed)
Rx called in 

## 2011-01-23 ENCOUNTER — Encounter: Payer: Self-pay | Admitting: Internal Medicine

## 2011-02-13 ENCOUNTER — Ambulatory Visit (INDEPENDENT_AMBULATORY_CARE_PROVIDER_SITE_OTHER): Payer: Self-pay | Admitting: Family Medicine

## 2011-02-13 ENCOUNTER — Encounter: Payer: Self-pay | Admitting: Family Medicine

## 2011-02-13 VITALS — BP 151/93 | HR 94

## 2011-02-13 DIAGNOSIS — M7500 Adhesive capsulitis of unspecified shoulder: Secondary | ICD-10-CM

## 2011-02-13 DIAGNOSIS — M7520 Bicipital tendinitis, unspecified shoulder: Secondary | ICD-10-CM

## 2011-02-13 NOTE — Progress Notes (Signed)
  Subjective:    Patient ID: Lori [name], female    DOB: 08-Sep-1955, 55 y.o.   MRN: [id number]  HPI  Left shoulder pain that is different from her previous issues. Started about 3 weeks ago. Anterior part of the shoulder aches and a sore at the same time. Worse with flexion of her elbow that is when she picks up stuff. No new injury that she is aware of. Denies numbness or tingling in her hands. Has been unable to do her exercise program because of this and she signed get some of her stiffness back in her left shoulder.  Review of Systems    denies fever or, denies numbness in her left hand. Has seen no erythema or rash on her left shoulder. Objective:   Physical Exam  GENERAL: Well developed, well nourished, no acute distress Shoulder: Left. Limited flow or flexion to about 120 secondary to pain. Passive leakage she has 160 of range. External rotation is full. Internal rotation she can place her left hand on her hip. Grip strength is normal.  ULTRASOUND:  Left rotator cuff muscles are intact. There is some areas of small calcification noted in the supraspinatus. There is no effusion. The biceps tendon appears to be intact. There is small effusion around the tendon at its proximal portion. The tendon appears to be intact.  INJECTION: Patient was given informed consent, signed copy in the chart. Appropriate time out was taken. Area prepped and draped in usual sterile fashion. One cc of kenalog plus  2 cc of lidocaine was injected into the left biceps tendon sheath using a(n) anterior protein 45 angle coming from the proximal direction approach. The patient tolerated the procedure well. There were no complications. Post procedure instructions were given.     Assessment & Plan:  #1 biceps tendinitis. Corticosteroid injection today. We'll add biceps curls to her rotator cuff exercises. Gave her another handout and another Scientist, product/process development. Return to clinic 4 weeks #2. Frozen shoulder: Had initially  improved but she is having some return of stiffness secondary to biceps tendon pain. Her short 2 restart her rotator cuff exercises.

## 2011-02-21 ENCOUNTER — Other Ambulatory Visit: Payer: Self-pay | Admitting: *Deleted

## 2011-02-21 MED ORDER — HYDROCODONE-ACETAMINOPHEN 5-325 MG PO TABS
2.0000 | ORAL_TABLET | Freq: Three times a day (TID) | ORAL | Status: DC | PRN
Start: 2011-02-21 — End: 2011-02-21

## 2011-02-21 MED ORDER — HYDROCODONE-ACETAMINOPHEN 5-325 MG PO TABS: 2.0000 | ORAL_TABLET | Freq: Three times a day (TID) | ORAL | Status: DC | PRN

## 2011-02-22 ENCOUNTER — Encounter: Payer: Self-pay | Admitting: *Deleted

## 2011-02-22 MED ORDER — HYDROCODONE-ACETAMINOPHEN 5-325 MG PO TABS: 2.0000 | ORAL_TABLET | Freq: Three times a day (TID) | ORAL | Status: DC | PRN

## 2011-03-13 ENCOUNTER — Ambulatory Visit (INDEPENDENT_AMBULATORY_CARE_PROVIDER_SITE_OTHER): Payer: Self-pay | Admitting: Family Medicine

## 2011-03-13 VITALS — BP 172/80

## 2011-03-13 DIAGNOSIS — M25519 Pain in unspecified shoulder: Secondary | ICD-10-CM

## 2011-03-13 DIAGNOSIS — M25512 Pain in left shoulder: Secondary | ICD-10-CM

## 2011-03-16 NOTE — Progress Notes (Signed)
  Subjective:    Patient ID: Lori [name], female    DOB: [birth year], 55 y.o.   MRN: [id number]  HPI  Continued left shoulder pain. Stiffness seems to be gone. Unable to do her exercises secondary to pain. CSI did not seem to help. Requests some oral pain medicine. No new symptoms, no new injruy.  Review of Systems    Pertinent review of systems: negative for fever or unusual weight change. Denies headache, denies blurry vision and denies chest pain Objective:   Physical Exam Vital signs reviewed. Noted elevated BP--pertinent ROS negative for acute process. GENERAL: Well developed, well nourished, no acute distress  SHOULDER: left shoulder difficult to examine secondary to pain but she seems to have FROM in all planes when she is distracted from exam. Strength is intact supraspinatus, IR/ER. Grip strength      Assessment & Plan:  1. Shoulder pain ---US showed some calcifications consistent with old healed injuries but no large or acute defect when we performed it last week. Corticosteroid injections have failed. Gabapentin was not helpful.She has failed home exercise program and has not wanted to try formal PT. We discussed at length and I have nothing else to offer her at this time. I am unwilling to rx narcotic medications for this type pain on an ongoing basis so I refer her back to her PCP. I will be happy to see this very nice lady at any time, but I do not have anything else to offer regarding her shoulder pain at this time.

## 2011-03-17 ENCOUNTER — Other Ambulatory Visit: Payer: Self-pay | Admitting: Family Medicine

## 2011-04-10 ENCOUNTER — Telehealth: Payer: Self-pay | Admitting: *Deleted

## 2011-04-10 NOTE — Telephone Encounter (Signed)
Pt calls requesting dental referral, appt 12/5 at 1445 dr Gilford Rile

## 2011-04-11 ENCOUNTER — Ambulatory Visit: Payer: Self-pay | Admitting: Internal Medicine

## 2011-04-12 ENCOUNTER — Ambulatory Visit (INDEPENDENT_AMBULATORY_CARE_PROVIDER_SITE_OTHER): Payer: Self-pay | Admitting: Internal Medicine

## 2011-04-12 ENCOUNTER — Encounter: Payer: Self-pay | Admitting: Internal Medicine

## 2011-04-12 VITALS — BP 140/80 | HR 85 | Temp 97.5°F | Ht 63.0 in | Wt 189.5 lb

## 2011-04-12 DIAGNOSIS — M25512 Pain in left shoulder: Secondary | ICD-10-CM

## 2011-04-12 DIAGNOSIS — K029 Dental caries, unspecified: Secondary | ICD-10-CM

## 2011-04-12 DIAGNOSIS — M25519 Pain in unspecified shoulder: Secondary | ICD-10-CM

## 2011-04-12 MED ORDER — HYDROCODONE-ACETAMINOPHEN 5-325 MG PO TABS: ORAL_TABLET | ORAL | Status: DC

## 2011-04-12 NOTE — Progress Notes (Signed)
  Subjective:    Patient ID: Lori [name], female    DOB: 10/08/1955, 55 y.o.   MRN: [id number]  HPI  Patient is a 55 year old female with a past medical history listed below, presents to the outpatient clinic complaining of dental pain that has been ongoing for the past week, would like a dental referral, note that she also has chronic left shoulder pain which has been managed by sports medicine with a combination of narcotics and local injections, however sports medicine not longer feel that she would benefit from their services and are not comfortable providing long-term narcotics, therefore she is here for reevaluation. Patient denies any other complaints.   Patient Active Problem List  Diagnoses  . CERVICAL CANCER  . TOBACCO ABUSE  . DEPRESSION  . HYPERTENSION  . GERD  . INSOMNIA  . SNORING  . Leg pain  . Abdominal pain  . Preventive measure  . Shoulder pain, left  . Frozen shoulder  . Bursitis of hip, right  . Contusion, buttock   Current Outpatient Prescriptions on File Prior to Visit  Medication Sig Dispense Refill  . FLUoxetine (PROZAC) 40 MG capsule Take 1 capsule (40 mg total) by mouth daily.  30 capsule  3  . gabapentin (NEURONTIN) 100 MG tablet  Patient has written taper up to three tabs by mouth three times a day    125 tablet  1  . hydrochlorothiazide (,MICROZIDE/HYDRODIURIL,) 12.5 MG capsule Take 1 capsule (12.5 mg total) by mouth daily.  90 capsule  2  . omeprazole (PRILOSEC) 40 MG capsule Take 1 capsule (40 mg total) by mouth daily.  30 capsule  3  . zolpidem (AMBIEN) 10 MG tablet Take 1 tablet (10 mg total) by mouth at bedtime as needed for sleep.  30 tablet  3   No Known Allergies   Review of Systems  Musculoskeletal: Positive for myalgias and arthralgias. Negative for joint swelling.  All other systems reviewed and are negative.       Objective:   Physical Exam  Nursing note and vitals reviewed. Constitutional: She is oriented to person, place, and  time. She appears well-developed and well-nourished.  HENT:  Head: Normocephalic and atraumatic.  Mouth/Throat:    Eyes: Pupils are equal, round, and reactive to light.  Neck: Normal range of motion. Neck supple. No JVD present. No thyromegaly present.  Cardiovascular: Normal rate, regular rhythm and normal heart sounds.   No murmur heard. Pulmonary/Chest: Effort normal and breath sounds normal. She has no wheezes. She has no rales.  Abdominal: Soft. Bowel sounds are normal.  Musculoskeletal: She exhibits no edema.       Left shoulder: She exhibits decreased range of motion, tenderness, pain and decreased strength. She exhibits no bony tenderness, no swelling, no effusion, no crepitus, no deformity, no laceration, no spasm and normal pulse.  Neurological: She is alert and oriented to person, place, and time.  Skin: Skin is warm and dry.          Assessment & Plan:

## 2011-04-12 NOTE — Assessment & Plan Note (Signed)
Was managed by sports medicine with combination of narcotics and local injections, however recently sports medicine did not feel comfortable providing the patient with long-term narcotics, and therefore sent her back here for further management. The patient's pain is subjective, and according to her the only medication that has helped has been narcotics, and other medications such as tramadol and nonsteroidal anti-inflammatories made her feel ill. Given her acute presentation I will give her a small dose of Vicodin and defer the final decision to her primary care physician

## 2011-04-12 NOTE — Assessment & Plan Note (Signed)
On exam patient has poor dentition with multiple caries some appear deep, no sign of infection or abscess present yet. We'll make a referral to dentistry, give the patient small dose of Vicodin, and informed her to come back if she develops any signs or symptoms of infection such as fever or chills or worsening pain.

## 2011-04-12 NOTE — Patient Instructions (Signed)
Dental Caries   Tooth decay (dental caries, cavities) is the most common of all oral diseases. It occurs in all ages but is more common in children and young adults.   CAUSES   Bacteria in your mouth combine with foods (particularly sugars and starches) to produce plaque. Plaque is a substance that sticks to the hard surfaces of teeth. The bacteria in the plaque produce acids that attack the enamel of teeth. Repeated acid attacks dissolve the enamel and create holes in the teeth. Root surfaces of teeth may also get these holes.   Other contributing factors include:   · Frequent snacking and drinking of cavity-producing foods and liquids.   · Poor oral hygiene.   · Dry mouth.   · Substance abuse such as methamphetamine.   · Broken or poor fitting dental restorations.   · Eating disorders.   · Gastroesophageal reflux disease (GERD).   · Certain radiation treatments to the head and neck.   SYMPTOMS   At first, dental decay appears as white, chalky areas on the enamel. In this early stage, symptoms are seldom present. As the decay progresses, pits and holes may appear on the enamel surfaces. Progression of the decay will lead to softening of the hard layers of the tooth. At this point you may experience some pain or achy feeling after sweet, hot, or cold foods or drinks are consumed. If left untreated, the decay will reach the internal structures of the tooth and produce severe pain. Extensive dental treatment, such as root canal therapy, may be needed to save the tooth at this late stage of decay development.   DIAGNOSIS   Most cavities will be detected during regular check-ups. A thorough medical and dental history will be taken by the dentist. The dentist will use instruments to check the surfaces of your teeth for any breakdown or discoloration. Some dentists have special instruments, such as lasers, that detect tooth decay. Dental X-rays may also show some cavities that are not visible to the eye (such as between  the contact areas of the teeth).  TREATMENT   Treatment involves removal  of the tooth decay and replacement with a restorative material such as silver, gold, or composite (white) material. However, if the decay involves a large area of the tooth and there is little remaining healthy tooth structure, a cap (crown) will be fitted over the remaining structure. If the decay involves the center part of the tooth (pulp), root canal treatment will be needed before any type of dental restoration is placed. If the tooth is severely destroyed by the decay process, leaving the remaining tooth structures unrestorable, the tooth will need to be pulled (extracted). Some early tooth decay may be reversed by fluoride treatments and thorough brushing and flossing at home.  PREVENTION   · Eat healthy foods. Restrict the amount of sugary, starchy foods and liquids you consume. Avoid frequent snacking and drinking of unhealthy foods and liquids.   · Sealants can help with prevention of cavities. Sealants are composite resins applied onto the biting surfaces of teeth at risk for decay. They smooth out the pits and grooves and prevent food from being trapped in them. This is done in early childhood before tooth decay has started.   · Fluoride tablets may also be prescribed to children between 6 months and 10 years of age if your drinking water is not fluoridated. The fluoride absorbed by the tooth enamel makes teeth less susceptible to decay. Thorough daily cleaning with a   toothbrush and dental floss is the best way to prevent cavities. Use of a fluoride toothpaste is highly recommended. Fluoride mouth rinses may be used in specific cases.   · Topical application of fluoride by your dentist is important in children.   · Regular visits with a dentist for checkups and cleanings are also important.   SEEK IMMEDIATE DENTAL CARE IF:  · You have a fever.   · You develop redness and swelling of your face, jaw, or neck.   · You develop swelling  around a tooth.   · You are unable to open your mouth or cannot swallow.   · You have severe pain uncontrolled by pain medicine.   Document Released: 01/14/2002 Document Revised: 01/04/2011 Document Reviewed: 09/29/2010  [employer]® Patient Information ©2012 [employer].

## 2011-04-25 ENCOUNTER — Other Ambulatory Visit: Payer: Self-pay | Admitting: Internal Medicine

## 2011-04-25 ENCOUNTER — Telehealth: Payer: Self-pay | Admitting: *Deleted

## 2011-04-25 MED ORDER — TRAMADOL HCL 50 MG PO TABS: 100.0000 mg | ORAL_TABLET | Freq: Three times a day (TID) | ORAL | Status: AC | PRN

## 2011-04-25 NOTE — Telephone Encounter (Signed)
Pt called asking for pain meds.  She states she has 2 teeth that are bothering her and several dental caries. She was seen in clinic and referred her to DDS.  She can't be seen until 06/20/11 and is asking for pain meds to last until then. She was given vicodin at last visit. Pt is tearful on phone.  Will you refill pain meds?

## 2011-04-25 NOTE — Telephone Encounter (Signed)
Patient has moderate to high risk to abuse, however given the acute pain, I will give tramadol for the pain and advise the patient to take NSAID to supplement tramadol for her pain. Script will be sent to pharmacy.

## 2011-04-25 NOTE — Telephone Encounter (Signed)
Pt informed

## 2011-04-27 ENCOUNTER — Other Ambulatory Visit: Payer: Self-pay | Admitting: Internal Medicine

## 2011-04-27 MED ORDER — AMOXICILLIN 500 MG PO CAPS: 500.0000 mg | ORAL_CAPSULE | Freq: Two times a day (BID) | ORAL | Status: AC

## 2011-05-10 ENCOUNTER — Encounter: Payer: Self-pay | Admitting: Internal Medicine

## 2011-05-10 ENCOUNTER — Ambulatory Visit (INDEPENDENT_AMBULATORY_CARE_PROVIDER_SITE_OTHER): Payer: Self-pay | Admitting: Internal Medicine

## 2011-05-10 VITALS — BP 127/80 | HR 86 | Temp 97.8°F | Ht 63.0 in | Wt 185.3 lb

## 2011-05-10 DIAGNOSIS — F32A Depression, unspecified: Secondary | ICD-10-CM

## 2011-05-10 DIAGNOSIS — M7500 Adhesive capsulitis of unspecified shoulder: Secondary | ICD-10-CM

## 2011-05-10 DIAGNOSIS — F329 Major depressive disorder, single episode, unspecified: Secondary | ICD-10-CM

## 2011-05-10 DIAGNOSIS — K219 Gastro-esophageal reflux disease without esophagitis: Secondary | ICD-10-CM

## 2011-05-10 DIAGNOSIS — Z23 Encounter for immunization: Secondary | ICD-10-CM

## 2011-05-10 DIAGNOSIS — G4700 Insomnia, unspecified: Secondary | ICD-10-CM

## 2011-05-10 DIAGNOSIS — K029 Dental caries, unspecified: Secondary | ICD-10-CM

## 2011-05-10 DIAGNOSIS — R110 Nausea: Secondary | ICD-10-CM | POA: Insufficient documentation

## 2011-05-10 DIAGNOSIS — E119 Type 2 diabetes mellitus without complications: Secondary | ICD-10-CM

## 2011-05-10 DIAGNOSIS — I10 Essential (primary) hypertension: Secondary | ICD-10-CM

## 2011-05-10 LAB — GLUCOSE, CAPILLARY: Glucose-Capillary: 84 mg/dL (ref 70–99)

## 2011-05-10 LAB — POCT GLYCOSYLATED HEMOGLOBIN (HGB A1C): Hemoglobin A1C: 5.5

## 2011-05-10 MED ORDER — OMEPRAZOLE 40 MG PO CPDR: 40.0000 mg | DELAYED_RELEASE_CAPSULE | Freq: Every day | ORAL | Status: DC

## 2011-05-10 MED ORDER — FLUOXETINE HCL 40 MG PO CAPS: 40.0000 mg | ORAL_CAPSULE | Freq: Every day | ORAL | Status: DC

## 2011-05-10 MED ORDER — ZOLPIDEM TARTRATE 10 MG PO TABS: 10.0000 mg | ORAL_TABLET | Freq: Every evening | ORAL | Status: DC | PRN

## 2011-05-10 MED ORDER — HYDROCODONE-ACETAMINOPHEN 5-500 MG PO TABS: 1.0000 | ORAL_TABLET | Freq: Four times a day (QID) | ORAL | Status: DC | PRN

## 2011-05-10 MED ORDER — HYDROCHLOROTHIAZIDE 12.5 MG PO CAPS: 12.5000 mg | ORAL_CAPSULE | Freq: Every day | ORAL | Status: DC

## 2011-05-10 NOTE — Assessment & Plan Note (Addendum)
After long discussion patient is willing to go for physical therapy. Furthermore I will provide patient with Vicodin. I informed her and underlined that this is just a short-term therapy. She always needs to be compliant with physical therapy. If she does not I would discontinue the Vicodin. I obtained a pain contract and discussed with her that she needs to be compliant with the contract otherwise I will discontinue the Vicodin. I obtained a urine drug screen today. She noted that she understood and will do whatever he needed to improve her shoulder pain. 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

## 2011-05-10 NOTE — Assessment & Plan Note (Signed)
Patient has followup with dentist in February.

## 2011-05-10 NOTE — Progress Notes (Signed)
Subjective:   Patient ID: Lori [name] female   DOB: 03-23-1956 56 y.o.   MRN: [id number]  HPI: [name] is a 56 y.o. female with possible history significant as outlined below who presented to the clinic with persistent left shoulder pain and nausea. Patient noted that she has been eval by sports medicine but nothing helped. She was really upset that no one is taking care of her pain. She informs me that she would like to try everything to improve the pain. She reports that she has been experiencing nausea since 3 days and heaves .  She denies any abdominal pain, diarrhea, fevers or chills but reports constipation. She was not able to take any medication although she ran out of all her prescriptions 2 months ago. She was evaluated during the last office visit for dental pain. She was prescribed penicillin and will follow up with a dentist in February.     Past Medical History  Diagnosis Date  . Hypertension 2007    Admitted for HTN crisis in 10/2008. Was given in 2010 a wrong prescription from the pharmacy and per ED note it was Lisinopril-Hydrocholorthizide which gave her a  hives and feeling of sickness. Patient was started  on this meds on 05/31/2010 by Dr  Bradd Canary without any problem.   . Depression with anxiety 2010    Patient had multiple death in her family over a short period of time. father, brother, uncle and niece who was stabbed 3 years ago. Patient's husband had a car accident last year and  need currently a feeding tube.   . Insomnia   . History of cervical cancer  1985     status post partial hysterectomy , last Pap smear 10 years ago , no further followup  . Tobacco abuse      35 years  . GERD (gastroesophageal reflux disease)   . History of cocaine abuse      last documented in 2007 when admitted for hypertensive urgency  . COCAINE ABUSE, HX OF 10/23/2008   Current Outpatient Prescriptions  Medication Sig Dispense Refill  . FLUoxetine (PROZAC) 40 MG capsule Take 1  capsule (40 mg total) by mouth daily.  30 capsule  3  . gabapentin (NEURONTIN) 100 MG tablet  Patient has written taper up to three tabs by mouth three times a day    125 tablet  1  . hydrochlorothiazide (,MICROZIDE/HYDRODIURIL,) 12.5 MG capsule Take 1 capsule (12.5 mg total) by mouth daily.  90 capsule  2  . HYDROcodone-acetaminophen (NORCO) 5-325 MG per tablet Take one half or one tablet at night prn pain  20 tablet  0  . omeprazole (PRILOSEC) 40 MG capsule Take 1 capsule (40 mg total) by mouth daily.  30 capsule  3  . zolpidem (AMBIEN) 10 MG tablet Take 1 tablet (10 mg total) by mouth at bedtime as needed for sleep.  30 tablet  3   Review of Systems: Constitutional: Denies fever, chills, diaphoresis, appetite change and fatigue.  Respiratory: Denies SOB, DOE, cough, chest tightness,  and wheezing.   Cardiovascular: Denies chest pain, palpitations and leg swelling.  Gastrointestinal: Denies  abdominal pain, diarrhea,  blood in stool and abdominal distention.  Genitourinary: Denies dysuria, urgency, frequency, hematuria, flank pain and difficulty urinating.  Skin: Denies pallor, rash and wound.    Objective:  Physical Exam: Filed Vitals:   05/10/11 1557  BP: 127/80  Pulse: 86  Temp: 97.8 F (36.6 C)  TempSrc: Oral  Height: 5'  3" (1.6 m)  Weight: 185 lb 4.8 oz (84.052 kg)  SpO2: 99%   Constitutional: Vital signs reviewed.  Patient is a well-developed and well-nourished in no acute distress and cooperative with exam. Alert and oriented x3.  Neck: Supple,  Cardiovascular: RRR, S1 normal, S2 normal, no MRG, pulses symmetric and intact bilaterally Pulmonary/Chest: CTAB, no wheezes, rales, or rhonchi Abdominal: Soft. Non-tender, non-distended, bowel sounds are normal,  Musculoskeletal: left shoulder, difficult to access due to pain. But if distracted somewhat normal ROM.   Neurological: A&O x3, no focal motor deficit, sensory intact to light touch bilaterally.  Skin: Warm, dry and  intact. No rash, cyanosis, or clubbing.

## 2011-05-10 NOTE — Assessment & Plan Note (Signed)
Provide a prescription for Ambien.

## 2011-05-11 ENCOUNTER — Other Ambulatory Visit: Payer: Self-pay | Admitting: Internal Medicine

## 2011-05-11 MED ORDER — DOCUSATE SODIUM 100 MG PO CAPS: 100.0000 mg | ORAL_CAPSULE | Freq: Every day | ORAL | Status: DC

## 2011-05-12 LAB — PRESCRIPTION ABUSE MONITORING 15P, URINE
Amphetamine/Meth: NEGATIVE NG/ML
Barbiturate Screen, Urine: NEGATIVE NG/ML
Benzodiazepine Screen, Urine: NEGATIVE NG/ML
Buprenorphine, Urine: NEGATIVE NG/ML
Cannabinoid Scrn, Ur: NEGATIVE NG/ML
Carisoprodol, Urine: NEGATIVE NG/ML
Cocaine Metabolites: NEGATIVE NG/ML
Creatinine, Urine: 49.3 MG/DL
Fentanyl, Ur: NEGATIVE NG/ML
Meperidine, Ur: NEGATIVE NG/ML
Methadone Screen, Urine: NEGATIVE NG/ML
Opiate Screen, Urine: POSITIVE NG/ML — ABNORMAL HIGH
Oxycodone Screen, Ur: NEGATIVE NG/ML
Propoxyphene: NEGATIVE NG/ML
Tramadol Scrn, Ur: POSITIVE NG/ML — ABNORMAL HIGH
Zolpidem, Urine: NEGATIVE NG/ML

## 2011-05-12 LAB — OPIATES/OPIOIDS (LC/MS-MS)
Codeine Urine: NEGATIVE NG/ML
Heroin (6-AM), UR: NEGATIVE NG/ML
Hydrocodone: 80 NG/ML
Hydromorphone: 104 NG/ML — ABNORMAL HIGH
Morphine Urine: NEGATIVE NG/ML
Oxycodone, ur: NEGATIVE NG/ML
Oxymorphone: 39 NG/ML

## 2011-05-12 LAB — TRAMADOL, URINE
N-DESMETHYL-CIS-TRAMADOL: 30 NG/ML
Tramadol, Urine: 205 NG/ML — ABNORMAL HIGH

## 2011-05-29 ENCOUNTER — Ambulatory Visit: Payer: Self-pay | Attending: Internal Medicine

## 2011-05-29 DIAGNOSIS — M25619 Stiffness of unspecified shoulder, not elsewhere classified: Secondary | ICD-10-CM | POA: Insufficient documentation

## 2011-05-29 DIAGNOSIS — R5381 Other malaise: Secondary | ICD-10-CM | POA: Insufficient documentation

## 2011-05-29 DIAGNOSIS — M25519 Pain in unspecified shoulder: Secondary | ICD-10-CM | POA: Insufficient documentation

## 2011-05-29 DIAGNOSIS — IMO0001 Reserved for inherently not codable concepts without codable children: Secondary | ICD-10-CM | POA: Insufficient documentation

## 2011-06-06 ENCOUNTER — Ambulatory Visit: Payer: Self-pay

## 2011-06-08 ENCOUNTER — Other Ambulatory Visit: Payer: Self-pay | Admitting: Internal Medicine

## 2011-06-08 ENCOUNTER — Ambulatory Visit: Payer: Self-pay

## 2011-06-08 NOTE — Telephone Encounter (Signed)
Refill was called to th [facility].  Angelina Ok, RN 06/08/2011 1:31 PM

## 2011-06-12 ENCOUNTER — Ambulatory Visit (HOSPITAL_COMMUNITY)
Admission: RE | Admit: 2011-06-12 | Discharge: 2011-06-12 | Disposition: A | Discharge status: Home / Self Care | Payer: Self-pay | Source: Ambulatory Visit | Attending: Internal Medicine | Admitting: Internal Medicine

## 2011-06-12 ENCOUNTER — Ambulatory Visit (INDEPENDENT_AMBULATORY_CARE_PROVIDER_SITE_OTHER): Payer: Self-pay | Admitting: Internal Medicine

## 2011-06-12 ENCOUNTER — Ambulatory Visit: Payer: Self-pay | Attending: Internal Medicine

## 2011-06-12 ENCOUNTER — Encounter: Payer: Self-pay | Admitting: Internal Medicine

## 2011-06-12 VITALS — BP 152/87 | HR 83 | Temp 98.3°F | Ht 63.0 in | Wt 181.0 lb

## 2011-06-12 DIAGNOSIS — R5381 Other malaise: Secondary | ICD-10-CM | POA: Insufficient documentation

## 2011-06-12 DIAGNOSIS — I10 Essential (primary) hypertension: Secondary | ICD-10-CM

## 2011-06-12 DIAGNOSIS — F329 Major depressive disorder, single episode, unspecified: Secondary | ICD-10-CM

## 2011-06-12 DIAGNOSIS — M25619 Stiffness of unspecified shoulder, not elsewhere classified: Secondary | ICD-10-CM | POA: Insufficient documentation

## 2011-06-12 DIAGNOSIS — M25519 Pain in unspecified shoulder: Secondary | ICD-10-CM

## 2011-06-12 DIAGNOSIS — F17200 Nicotine dependence, unspecified, uncomplicated: Secondary | ICD-10-CM

## 2011-06-12 DIAGNOSIS — N289 Disorder of kidney and ureter, unspecified: Secondary | ICD-10-CM

## 2011-06-12 DIAGNOSIS — M7500 Adhesive capsulitis of unspecified shoulder: Secondary | ICD-10-CM

## 2011-06-12 DIAGNOSIS — IMO0001 Reserved for inherently not codable concepts without codable children: Secondary | ICD-10-CM | POA: Insufficient documentation

## 2011-06-12 DIAGNOSIS — K029 Dental caries, unspecified: Secondary | ICD-10-CM

## 2011-06-12 DIAGNOSIS — M25512 Pain in left shoulder: Secondary | ICD-10-CM

## 2011-06-12 DIAGNOSIS — K219 Gastro-esophageal reflux disease without esophagitis: Secondary | ICD-10-CM

## 2011-06-12 MED ORDER — OMEPRAZOLE 40 MG PO CPDR: 40.0000 mg | DELAYED_RELEASE_CAPSULE | Freq: Every day | ORAL | Status: DC

## 2011-06-12 NOTE — Assessment & Plan Note (Signed)
Discussed extensively about smoking cessation. Patient is not yet ready so she is experiencing significant pain. But she is willing to cut down.

## 2011-06-12 NOTE — Progress Notes (Signed)
Subjective:   Patient ID: Lori [name] female   DOB: 1955-11-02 56 y.o.   MRN: [id number]  HPI: [name] is a 56 y.o. female with past medical history significant as outlined below who presented to the clinic for a regular office visit. 1. Shoulder pain: Patient was evaluated for her left shoulder by physical therapy but per patient he recommended no further physical therapy due to significant pain. We will obtain records. The patient is currently taking pain medication regular basis. 2. Smoking: The patient is currently smoking 5 cigarettes a day. Patient is currently trying to cut down but it is difficult with patient's pain.  3. Depression: Patient noted she is doing fine just the left shoulder pain is bothering her. Her mood has improved and he is not so aggressive anymore. She noted she has been taking Prozac on a when necessary basis.    Past Medical History  Diagnosis Date  . Hypertension 2007    Admitted for HTN crisis in 10/2008. Was given in 2010 a wrong prescription from the pharmacy and per ED note it was Lisinopril-Hydrocholorthizide which gave her a  hives and feeling of sickness. Patient was started  on this meds on 05/31/2010 by Dr  Bradd Canary without any problem.   . Depression with anxiety 2010    Patient had multiple death in her family over a short period of time. father, brother, uncle and niece who was stabbed 3 years ago. Patient's husband had a car accident last year and  need currently a feeding tube.   . Insomnia   . History of cervical cancer  1985     status post partial hysterectomy , last Pap smear 10 years ago , no further followup  . Tobacco abuse      35 years  . GERD (gastroesophageal reflux disease)   . History of cocaine abuse 10/23/2008     last documented in 2007 when admitted for hypertensive urgency   Current Outpatient Prescriptions  Medication Sig Dispense Refill  . docusate sodium (COLACE) 100 MG capsule Take 1 capsule (100 mg total) by mouth  daily.  30 capsule  1  . FLUoxetine (PROZAC) 40 MG capsule Take 1 capsule (40 mg total) by mouth daily.  30 capsule  3  . hydrochlorothiazide (MICROZIDE) 12.5 MG capsule Take 1 capsule (12.5 mg total) by mouth daily.  90 capsule  2  . HYDROcodone-acetaminophen (VICODIN) 5-500 MG per tablet TAKE 1 TABLET BY MOUTH EVERY 6 HOURS AS NEEDED FOR PAIN  120 tablet  0  . omeprazole (PRILOSEC) 40 MG capsule Take 1 capsule (40 mg total) by mouth daily.  30 capsule  3  . zolpidem (AMBIEN) 10 MG tablet Take 1 tablet (10 mg total) by mouth at bedtime as needed for sleep.  30 tablet  3   Family History  Problem Relation Age of Onset  . Thyroid disease Mother   . Diabetes Father   . Heart disease Father   . Hyperlipidemia Father   . Hypertension Father   . Diabetes Brother   . Hypertension Brother   . Hyperlipidemia Brother   . Thyroid disease Daughter   . Obesity Daughter   . Mental illness Daughter    History   Social History  . Marital Status: Married     Spouse Name: N/A    Number of Children: N/A  . Years of Education: N/A   Social History Main Topics  . Smoking status: Current Some Day Smoker    Types:  Cigarettes  . Smokeless tobacco: Never Used   Comment: 5 cigarette/day  . Alcohol Use: No  . Drug Use: No  . Sexually Active: None   Other Topics Concern  . None   Social History Narrative  . None   Review of Systems: Constitutional: Denies fever, chills, diaphoresis, appetite change and fatigue.  Respiratory: Denies SOB, DOE, cough, chest tightness,  and wheezing.   Cardiovascular: Denies chest pain, palpitations and leg swelling.  Gastrointestinal: Denies nausea, vomiting, abdominal pain, diarrhea, constipation, blood in stool and abdominal distention.  Genitourinary: Denies dysuria, urgency, frequency, hematuria, flank pain and difficulty urinating.  Musculoskeletal: Noted myalgias and shoulder pain. Skin: Denies pallor, rash and wound.  Neurological: Denies dizziness,   numbness and headaches.  Psychiatric/Behavioral: Denies suicidal ideation, mood changes,   Objective:  Physical Exam: Filed Vitals:   06/12/11 1413  BP: 162/89  Pulse: 87  Temp: 98.3 F (36.8 C)  Height: 5\' 3"  (1.6 m)  Weight: 181 lb (82.101 kg)   Constitutional: Vital signs reviewed.  Patient is a well-developed and well-nourished, in no acute distress and cooperative with exam. Alert and oriented x3.  Ear: TM normal bilaterally Mouth: no erythema or exudates, MMM. Poor dention   Neck: Supple,  Cardiovascular: RRR, S1 normal, S2 normal, pulses symmetric and intact bilaterally Pulmonary/Chest: CTAB, no wheezes, rales, or rhonchi Abdominal: Soft. Non-tender, non-distended, bowel sounds are normal,   GU: no CVA tenderness Musculoskeletal: Left shoulder: Stiffness, decreased ROM, no joint deformities, erythema, No other joints involved.  Hematology: no cervical,   Neurological: A&O x3, Strenght is normal and symmetric bilaterally,  no focal motor deficit, sensory intact to light touch bilaterally.  Skin: Warm, dry and intact. No rash, cyanosis, or clubbing.  Psychiatric: Normal mood and affect. speech and behavior is normal.

## 2011-06-12 NOTE — Assessment & Plan Note (Signed)
Patient has an appointment next week with dentist.

## 2011-06-12 NOTE — Assessment & Plan Note (Signed)
Shoulder pain is persistent. Followed by physical therapy. Will obtain records. I will obtain further and x-ray her off her left shoulder. If no abnormalities noted I would consider to get an MRI of the shoulder. Unfortunately we have to continue her opoid therapy. I will  obtain a UDS today.

## 2011-06-12 NOTE — Assessment & Plan Note (Signed)
Blood pressure elevated during the office visit. Patient reports she has been not taking her blood pressure medication since one week since doing last office visit her blood pressure was fine. I discussed with her that she needs to take her medication on a regular basis and then only her blood pressure is controlled. She noted that she had felt some stomach upset with taking the medication. I noted not to take it on an empty stomach and just a blood pressure medication in the morning. She could take her Prozac in the evening before bedtime.

## 2011-06-12 NOTE — Assessment & Plan Note (Signed)
For patient for screening colonoscopy. Next available appointment is in March of 2013.

## 2011-06-12 NOTE — Assessment & Plan Note (Signed)
The patient has been taking Prozac not on regular basis. Patient noted she has episodes of depression. I recommended to take it on a daily basis and reevaluate during the next office visit.

## 2011-06-12 NOTE — Assessment & Plan Note (Signed)
Patient was evaluated for abdominal pain in the June of 2012 and underwent a CT of the abdomen and pelvis which noted a left renal lesion. Recommendation was to follow up with an MRI which we'll obtain today.

## 2011-06-13 LAB — BASIC METABOLIC PANEL
BUN: 15 mg/dL (ref 6–23)
CO2: 27 mEq/L (ref 19–32)
Calcium: 10.3 mg/dL (ref 8.4–10.5)
Chloride: 103 mEq/L (ref 96–112)
Creat: 0.96 mg/dL (ref 0.50–1.10)
Glucose, Bld: 82 mg/dL (ref 70–99)
Potassium: 4.5 mEq/L (ref 3.5–5.3)
Sodium: 141 mEq/L (ref 135–145)

## 2011-06-13 LAB — PRESCRIPTION ABUSE MONITORING 15P, URINE
Amphetamine/Meth: NEGATIVE ng/mL
Barbiturate Screen, Urine: NEGATIVE ng/mL
Benzodiazepine Screen, Urine: NEGATIVE ng/mL
Buprenorphine, Urine: NEGATIVE ng/mL
Cannabinoid Scrn, Ur: NEGATIVE ng/mL
Carisoprodol, Urine: NEGATIVE ng/mL
Cocaine Metabolites: POSITIVE ng/mL — ABNORMAL HIGH
Creatinine, Urine: 40.47 mg/dL
Fentanyl, Ur: NEGATIVE ng/mL
Meperidine, Ur: NEGATIVE ng/mL
Methadone Screen, Urine: NEGATIVE ng/mL
Opiate Screen, Urine: POSITIVE ng/mL — ABNORMAL HIGH
Oxycodone Screen, Ur: NEGATIVE ng/mL
Propoxyphene: NEGATIVE ng/mL
Tramadol Scrn, Ur: NEGATIVE ng/mL
Zolpidem, Urine: POSITIVE ng/mL — ABNORMAL HIGH

## 2011-06-14 LAB — OPIATES/OPIOIDS (LC/MS-MS)
Codeine Urine: NEGATIVE NG/ML
Heroin (6-AM), UR: NEGATIVE NG/ML
Hydrocodone: 649 NG/ML — ABNORMAL HIGH
Hydromorphone: 65 NG/ML
Morphine Urine: NEGATIVE NG/ML
Oxycodone, ur: NEGATIVE NG/ML
Oxymorphone: NEGATIVE NG/ML

## 2011-06-14 LAB — COCAINE METABOLITE (GC/LC/MS), URINE: Benzoylecgonine GC/MS Conf: 902 NG/ML — ABNORMAL HIGH

## 2011-06-14 LAB — ZOLPIDEM (LC/MS-MS), URINE: Zolpidem, Urine: NEGATIVE NG/ML

## 2011-06-15 ENCOUNTER — Telehealth: Payer: Self-pay | Admitting: *Deleted

## 2011-06-15 NOTE — Telephone Encounter (Signed)
Pt would like you to call her and discuss her results, call at [phone]

## 2011-06-16 ENCOUNTER — Other Ambulatory Visit: Payer: Self-pay | Admitting: Internal Medicine

## 2011-06-16 DIAGNOSIS — M25512 Pain in left shoulder: Secondary | ICD-10-CM

## 2011-06-16 NOTE — Progress Notes (Signed)
Addended by: Hassan Buckler on: 06/16/2011 11:26 AM   Modules accepted: Orders

## 2011-06-19 ENCOUNTER — Inpatient Hospital Stay (HOSPITAL_COMMUNITY): Admission: RE | Admit: 2011-06-19 | Payer: Self-pay | Source: Ambulatory Visit

## 2011-06-22 ENCOUNTER — Ambulatory Visit (HOSPITAL_COMMUNITY)
Admission: RE | Admit: 2011-06-22 | Discharge: 2011-06-22 | Disposition: A | Discharge status: Home / Self Care | Payer: Self-pay | Source: Ambulatory Visit | Attending: Internal Medicine | Admitting: Internal Medicine

## 2011-06-22 DIAGNOSIS — M25512 Pain in left shoulder: Secondary | ICD-10-CM

## 2011-06-22 DIAGNOSIS — M25519 Pain in unspecified shoulder: Secondary | ICD-10-CM | POA: Insufficient documentation

## 2011-06-22 DIAGNOSIS — M719 Bursopathy, unspecified: Secondary | ICD-10-CM | POA: Insufficient documentation

## 2011-06-22 DIAGNOSIS — N289 Disorder of kidney and ureter, unspecified: Secondary | ICD-10-CM | POA: Insufficient documentation

## 2011-06-22 DIAGNOSIS — Z09 Encounter for follow-up examination after completed treatment for conditions other than malignant neoplasm: Secondary | ICD-10-CM | POA: Insufficient documentation

## 2011-06-22 DIAGNOSIS — M67919 Unspecified disorder of synovium and tendon, unspecified shoulder: Secondary | ICD-10-CM | POA: Insufficient documentation

## 2011-06-22 DIAGNOSIS — Q619 Cystic kidney disease, unspecified: Secondary | ICD-10-CM | POA: Insufficient documentation

## 2011-06-22 MED ORDER — GADOBENATE DIMEGLUMINE 529 MG/ML IV SOLN
18.0000 mL | Freq: Once | INTRAVENOUS | Status: AC | PRN
  Administered 2011-06-22: 18 mL via INTRAVENOUS

## 2011-06-23 ENCOUNTER — Encounter: Payer: Self-pay | Admitting: Internal Medicine

## 2011-06-23 ENCOUNTER — Other Ambulatory Visit: Payer: Self-pay | Admitting: Internal Medicine

## 2011-06-23 DIAGNOSIS — M25512 Pain in left shoulder: Secondary | ICD-10-CM

## 2011-06-24 ENCOUNTER — Other Ambulatory Visit: Payer: Self-pay | Admitting: Internal Medicine

## 2011-06-24 DIAGNOSIS — M25512 Pain in left shoulder: Secondary | ICD-10-CM

## 2011-06-24 MED ORDER — MELOXICAM 15 MG PO TABS: 15.0000 mg | ORAL_TABLET | Freq: Every day | ORAL | Status: DC

## 2011-06-26 ENCOUNTER — Encounter: Payer: Self-pay | Admitting: Internal Medicine

## 2011-06-26 MED ORDER — GADOBENATE DIMEGLUMINE 529 MG/ML IV SOLN
18.0000 mL | Freq: Once | INTRAVENOUS | Status: AC | PRN
  Administered 2011-06-26: 18 mL via INTRAVENOUS

## 2011-07-03 ENCOUNTER — Other Ambulatory Visit: Payer: Self-pay | Admitting: Internal Medicine

## 2011-07-03 NOTE — Telephone Encounter (Signed)
According to [facility] by Dr. Loistine Chance on 2/08, patient had UDS positive for cocaine on 2/04 and will need to be seen and evaluated before further opioids can be considered.

## 2011-07-05 NOTE — Telephone Encounter (Addendum)
Pt has appt 07/10/2011, please refuse the request, thank you

## 2011-07-06 ENCOUNTER — Other Ambulatory Visit: Payer: Self-pay | Admitting: Internal Medicine

## 2011-07-06 NOTE — Telephone Encounter (Signed)
According to FYI by Dr. Illath on 2/08, patient had UDS positive for cocaine on 2/04 and will need to be seen and evaluated before further opioids can be considered. 

## 2011-07-10 ENCOUNTER — Encounter: Payer: Self-pay | Admitting: Internal Medicine

## 2011-07-10 ENCOUNTER — Other Ambulatory Visit: Payer: Self-pay | Admitting: *Deleted

## 2011-07-10 NOTE — Telephone Encounter (Signed)
Lori [name] was reportedly positive for cocaine in a recent UDS.  She was reportedly told of this result and told that further narcotic refills require that she be seen in clinic.  She apparently has an appointment today with Dr. Gilford Rile (being covered by Dr. Eben Burow).  She should have a repeat UDS before she is given a refill of this narcotic.  If positive for "inappropriate" substances she may not be a candidate for narcotics at this time and may be better served by alternative pain medications such as NSAIDs or nonpharmacologic therapy.  I will therefore defer this refill until her appointment this afternoon and the results of the UDS from today.

## 2011-07-11 ENCOUNTER — Other Ambulatory Visit: Payer: Self-pay | Admitting: Internal Medicine

## 2011-07-11 DIAGNOSIS — G8929 Other chronic pain: Secondary | ICD-10-CM

## 2011-07-11 NOTE — Telephone Encounter (Signed)
Talked with pt about pain med - needs appt per Dr Eben Burow - UDS was placed future order. Pt aware UDS will be done  At appt time - sch 07/17/11 2:15PM Dr Arvilla Market. Stanton Kidney Deola Rewis RN 07/11/11 4:10PM

## 2011-07-11 NOTE — Telephone Encounter (Signed)
Patient did not show up for her appointment with me on 07/10/11. I will put in a future order for urine drug screen 15 panel prior to she coming in for a clinic appointment.  She was cocaine positive at last office visit and the decision to prescribe her narcotics is upto her PCP.

## 2011-07-12 ENCOUNTER — Other Ambulatory Visit: Payer: Self-pay | Admitting: *Deleted

## 2011-07-13 ENCOUNTER — Encounter: Payer: Self-pay | Admitting: Gastroenterology

## 2011-07-13 NOTE — Telephone Encounter (Signed)
Please see recent prior notes.  Patient must keep her appointment and have her pain regimen assessed.

## 2011-07-17 ENCOUNTER — Encounter: Payer: Self-pay | Admitting: Internal Medicine

## 2011-07-17 ENCOUNTER — Ambulatory Visit (INDEPENDENT_AMBULATORY_CARE_PROVIDER_SITE_OTHER): Payer: Self-pay | Admitting: Internal Medicine

## 2011-07-17 VITALS — BP 184/102 | HR 78 | Temp 98.2°F | Resp 20 | Ht 63.0 in | Wt 185.4 lb

## 2011-07-17 DIAGNOSIS — M25512 Pain in left shoulder: Secondary | ICD-10-CM

## 2011-07-17 DIAGNOSIS — G8929 Other chronic pain: Secondary | ICD-10-CM

## 2011-07-17 DIAGNOSIS — M25519 Pain in unspecified shoulder: Secondary | ICD-10-CM

## 2011-07-17 DIAGNOSIS — F1491 Cocaine use, unspecified, in remission: Secondary | ICD-10-CM

## 2011-07-17 DIAGNOSIS — I10 Essential (primary) hypertension: Secondary | ICD-10-CM

## 2011-07-17 DIAGNOSIS — Z87898 Personal history of other specified conditions: Secondary | ICD-10-CM

## 2011-07-17 NOTE — Progress Notes (Signed)
Subjective:     Patient ID: Lori [name], female   DOB: Jun 03, 1955, 56 y.o.   MRN: [id number]  HPI  Pt reports she is here today for urine drug test.  She has chronic pain in her right shoulder for the past year.  Her pain is aggravated by movement and is improved with a heating pad and pain medications.  She was recently prescribed Mobic for relief of pain; pt states it helps her pain.     Review of Systems Constitutional: Negative for fever, chills, diaphoresis, activity change, appetite change, fatigue and unexpected weight change.  HENT: Negative for hearing loss, congestion and neck stiffness.   Eyes: Negative for photophobia, pain and visual disturbance.  Respiratory: Negative for cough, chest tightness, shortness of breath and wheezing.   Cardiovascular: Negative for chest pain and palpitations.  Gastrointestinal: Negative for abdominal pain, blood in stool and anal bleeding.  Genitourinary: Negative for dysuria, hematuria and difficulty urinating.  Musculoskeletal: Negative for joint swelling.  Neurological: Negative for dizziness, syncope, speech difficulty, weakness, numbness and headaches.      Objective:   Physical Exam VItal signs reviewed and stable. GEN: No apparent distress.  Alert and oriented x 3.  Pleasant, conversant, and cooperative to exam. HEENT: head is autraumatic and normocephalic.  Neck is supple without palpable masses or lymphadenopathy.  No JVD or carotid bruits.  Vision intact.  EOMI.  PERRLA.  Sclerae anicteric.  Conjunctivae without pallor or injection. Mucous membranes are moist.  Oropharynx is without erythema, exudates, or other abnormal lesions.  Dentition is poor with numerous teeth missing. RESP:  Lungs are clear to ascultation bilaterally with good air movement.  No wheezes, ronchi, or rubs. CARDIOVASCULAR: regular rate, normal rhythm.  Clear S1, S2, no murmurs, gallops, or rubs. ABDOMEN: soft, non-tender, non-distended.  Bowels sounds present in all  quadrants and normoactive.  No palpable masses. EXT: warm and dry.  Peripheral pulses equal, intact, and +2 globally.  No clubbing or cyanosis.  Trace edema in bilateral lower extremities. SKIN: warm and dry with normal turgor.  No rashes or abnormal lesions observed. NEURO: CN II-XII grossly intact.  Muscle strength +5/5 in bilateral upper and lower extremities.  Sensation is grossly intact.  No focal deficit.     Assessment:

## 2011-07-18 LAB — PRESCRIPTION ABUSE MONITORING 15P, URINE
Amphetamine/Meth: NEGATIVE ng/mL
Barbiturate Screen, Urine: NEGATIVE ng/mL
Benzodiazepine Screen, Urine: NEGATIVE ng/mL
Buprenorphine, Urine: NEGATIVE ng/mL
Cannabinoid Scrn, Ur: NEGATIVE ng/mL
Carisoprodol, Urine: NEGATIVE ng/mL
Cocaine Metabolites: NEGATIVE ng/mL
Creatinine, Urine: 13.55 mg/dL
Fentanyl, Ur: NEGATIVE ng/mL
Meperidine, Ur: NEGATIVE ng/mL
Methadone Screen, Urine: NEGATIVE ng/mL
Opiate Screen, Urine: POSITIVE ng/mL — ABNORMAL HIGH
Oxycodone Screen, Ur: NEGATIVE ng/mL
Propoxyphene: NEGATIVE ng/mL
Tramadol Scrn, Ur: NEGATIVE ng/mL
Zolpidem, Urine: NEGATIVE ng/mL

## 2011-07-19 ENCOUNTER — Encounter: Payer: Self-pay | Admitting: Internal Medicine

## 2011-07-19 LAB — OPIATES/OPIOIDS (LC/MS-MS)
Codeine Urine: NEGATIVE NG/ML
Heroin (6-AM), UR: NEGATIVE NG/ML
Hydrocodone: NEGATIVE NG/ML
Hydromorphone: NEGATIVE NG/ML
Morphine Urine: NEGATIVE NG/ML
Oxycodone, ur: NEGATIVE NG/ML
Oxymorphone: NEGATIVE NG/ML

## 2011-07-19 NOTE — Patient Instructions (Signed)
Schedule an appointment with your PCP. I will call you when I have the results of your labs tests. We may need to increase you blood pressure medications. Try to take your blood pressure at [facility] over the next few weeks.  Write down the number and bring the record with you to your next office visit

## 2011-07-19 NOTE — Progress Notes (Unsigned)
Dr Arvilla Market was F/U on Ms Hogrefe' UDS. Prelim results back - awaiting final results. Ran [place] narc database. OK. Always got opioids at [facility] (last filled Nov 13th) or [facility] pharmacy (last refill 1/31 Dr Aundria Rud #[id number]) Called PT - showed up for eval (Records in Media) & 2 subsequent appts. No showed 2/4 appt. Receptionist said not dismissed. Does need new referral order.  It is "possible" that she had hydrocodone left over from the Jan fill that would explain her + UDS. Without knowing her usage and last dose it is hard to interpret. Recommend :  1. Wait until final UDS - I want to make certain the metabolite is present 2. When you call to inform of UDS results,  before you give results, innocently ask about her pain, how has she been doing without hydrocodone, what has she been using, how long she has gone without hydrocodone.... Then you can interpret the UDS results. 3. Get last PT note. Arloa Koh documented "per patient he recommended no further physical therapy due to significant pain" but that is not what PT receptionist told me - she no showed the appt.  I can't say whether to cont or D/C opioids now. However, there are a lot of red flags so if we cont, she needs very close F/U and SURPRISE UDS's - we should never tell the pt that an appt is for a UDS. And always document typical opioid usage and last dose and therefore expected UDS results so the UDS can be interpreted.

## 2011-07-19 NOTE — Assessment & Plan Note (Signed)
Blood pressure is significant elevated today; I obtained a repeat BP of 182/100 with manual cuff.  Pt denies headache, visual changes, chest pain, difficulty walking/speaking, tingling/numbess/weakness, or other complaint - she is asymptomatic.  When asked, she states she smoked a cigarette before her office visit today.  She also reports significant shoulder pain as a result of being off of narcotic medications.  Her elevated BP may be the result of underlying HTN acutely exacerbated by pain and nicotine.  I have asked her to check her blood pressure at [facility] (or other convenient location).  I will discuss these readings with her when I call her to discuss results of UDS.  If her BP remains elevated, will icnrease HCTZ to 25mg  daily.  If her BP remains as high as it is today (SBPs in the 170-180) may need to add a second medication in addition to increase in HCTA.  If her meds are adjusted, will have her return in 2-3 weeks for BP check and BMET.  Discussed this plan in detail with pt; she expresses understanding and agreement to plan.

## 2011-07-19 NOTE — Assessment & Plan Note (Signed)
Will continue mobic for now.  Pt was UDS positive for cocaine on 06/12/11 which represents a violation of pain contract; has not been taking narcotics since 05/2011.    She is here for repeat UDS today.  If negative, will consider resumption of chronic narcotics for pain relief.   I expect her UDS to be negative for all illicit medications, benzos, and narcotics.

## 2011-07-20 ENCOUNTER — Telehealth: Payer: Self-pay | Admitting: *Deleted

## 2011-07-20 ENCOUNTER — Telehealth: Payer: Self-pay | Admitting: Internal Medicine

## 2011-07-20 DIAGNOSIS — I10 Essential (primary) hypertension: Secondary | ICD-10-CM

## 2011-07-20 MED ORDER — HYDROCHLOROTHIAZIDE 25 MG PO TABS: 25.0000 mg | ORAL_TABLET | Freq: Every day | ORAL | Status: DC

## 2011-07-20 MED ORDER — HYDROCODONE-ACETAMINOPHEN 5-500 MG PO TABS
1.0000 | ORAL_TABLET | Freq: Four times a day (QID) | ORAL | Status: DC | PRN
Start: 2011-07-20 — End: 2011-08-16

## 2011-07-20 NOTE — Progress Notes (Unsigned)
Final UDS was negative (likely appropriate result) for any opioids. The initial screen that was + was likely a false + but I can call Dimas Aguas at [facility] this afternoon to see why that would happen.   Without knowing this pt, I cannot make a rec as to what to do with the opioids. I have no problem giving pts a second chance if the opioid need seems legit and the pt is willing to work with [place] - meaning resume PT, see PCP regularly, surprise UDS.

## 2011-07-20 NOTE — Telephone Encounter (Signed)
Called in rx for vicodin 5/500 tabs with instructions to take 1 tab by mouth every 6 hours as needed her pain; #120 with 0 refills asked her pain contract.  Patient is coming back for blood pressure check in 2 weeks. Will perform surprise urine drug screen at that time.  Thank you!

## 2011-07-20 NOTE — Telephone Encounter (Signed)
Pts UDS returned as expected.  I believe it is reasonable to refill her script today.  She will be seeing me again in 2 weeks, will perform surprise UDS at that time.  Will call in refill for Vicodin 5 500 mg tabs with instructions to take one tablet by mouth every 6 hours as needed for pain. Dispense #120 with 0 refills.  Discussed blood pressure readings with patient. She increased her HCTZ on her on and is currently taking one 12.5 mg tablet in the morning and one at night. She states she checked her blood pressure yesterday using a friend's cuff with a reading of 140/96.  I have asked her to continue taking 2 tablets per day. I will call in a prescription for 25 mg tablets for her to take once she finishes her current bottle.  Patient expresses understanding of this and agreement to plan. I will follow her up in 2 weeks for repeat blood pressure check and BMET to assess electrolyte status and renal function

## 2011-07-20 NOTE — Telephone Encounter (Signed)
Pt called stating that she was suppose to have more BP meds called in and her pain medicine. States she has not had pain med in over a week and her pain is very bad, i ask on a scale 0 to 10, 10 dying with pain and she answered 8/10, states pain is in her shoulder and arm, has been trying to use advil and reg tylenol during the day and tylenol pm at night, these do not help. She states she has been taking the hctz twice daily 1 am, 1 pm and yesterday at [facility] her BP was 98/142, that is the only reading she could give me. Please advise

## 2011-07-20 NOTE — Telephone Encounter (Signed)
She got a 9 month supply of HCTZ in Jan. She needs to call the pharmacy to have refilled. I will leave the option of refilling her opioid to Dr Loistine Chance and / or Arvilla Market. I have never seen her. I have no problem giving her a second chance if they feel it is appropriate. See my documentation note from 3/13. She would need close F/U though with surprise UDS.

## 2011-07-31 ENCOUNTER — Ambulatory Visit (AMBULATORY_SURGERY_CENTER): Payer: Self-pay | Admitting: *Deleted

## 2011-07-31 ENCOUNTER — Encounter: Payer: Self-pay | Admitting: Internal Medicine

## 2011-07-31 VITALS — Ht 62.5 in | Wt 180.0 lb

## 2011-07-31 DIAGNOSIS — Z1211 Encounter for screening for malignant neoplasm of colon: Secondary | ICD-10-CM

## 2011-07-31 MED ORDER — PEG-KCL-NACL-NASULF-NA ASC-C 100 G PO SOLR: ORAL | Status: DC

## 2011-08-01 ENCOUNTER — Encounter: Payer: Self-pay | Admitting: Internal Medicine

## 2011-08-01 ENCOUNTER — Ambulatory Visit (INDEPENDENT_AMBULATORY_CARE_PROVIDER_SITE_OTHER): Payer: Self-pay | Admitting: Internal Medicine

## 2011-08-01 VITALS — BP 159/84 | HR 97 | Temp 97.5°F | Ht 63.0 in | Wt 183.3 lb

## 2011-08-01 DIAGNOSIS — F329 Major depressive disorder, single episode, unspecified: Secondary | ICD-10-CM

## 2011-08-01 DIAGNOSIS — I10 Essential (primary) hypertension: Secondary | ICD-10-CM

## 2011-08-01 MED ORDER — PAROXETINE HCL 20 MG PO TABS: 20.0000 mg | ORAL_TABLET | Freq: Every day | ORAL | Status: DC

## 2011-08-01 MED ORDER — HYDROCHLOROTHIAZIDE 12.5 MG PO TABS: 12.5000 mg | ORAL_TABLET | Freq: Every day | ORAL | Status: DC

## 2011-08-01 NOTE — Patient Instructions (Signed)
Schedule followup appointment in 2 weeks. Paxil as a new medicine to help with her mood. Take one half of a tablet every day for 4 days and then increase to one full tablet daily.  Take this medicine every day. Try not to miss any doses. It may make you feel a little sick to her stomach for the first few days but this feeling will go away.  It is very important to keep taking the pill. Take half of your HCTZ blood pressure medicine. Keep taking all of your other medicine as directed

## 2011-08-01 NOTE — Progress Notes (Signed)
HCTZ 12.5mg  and Paxil 20mg  rx called to [facility].

## 2011-08-01 NOTE — Progress Notes (Signed)
Patient ID: Lori [name], female   DOB: 05/23/1955, 55 y.o.   MRN: [id number]  Subjective:     HPI:  Pt reports she is here today for follow up on her pressure. At her last office visit her blood pressure was noted to be above goal and her HCTZ was increased to 25 mg daily.  She states she has not felt well following increase of her blood pressure medication and did not take her pill today because she was feeling bad.  Patient states she began to feel bad right after taking the increased dose of her BP medication.  She notes increased irritability and nausea.  She states she has to "force herself to get up and do things."  She denies any abdominal pain, vomiting, diarrhea, dizziness, syncope, chest pain, or sob.  States she "just feels lousy."  She admits to chills for one day; she did not take a temp and does believe she was febrile.    She denies feeling depressed.  She admits to decreased appetite.  She states she feels like she doesn't want to be around people. She aslo has not been sleeping well the past few nights despite taking Ambien.  After further conversation, patient of she stopped taking Paxil 2 weeks ago. She previously been taking this for a while but stopped because she felt like it was making her nauseated.  She reports increased irritability and decreased pleasure in her usual activities.   Review of Systems Constitutional: Negative for fever, chills, diaphoresis, activity change, appetite change, fatigue and unexpected weight change.  HENT: Negative for hearing loss, congestion and neck stiffness.   Eyes: Negative for photophobia, pain and visual disturbance.  Respiratory: Negative for cough, chest tightness, shortness of breath and wheezing.   Cardiovascular: Negative for chest pain and palpitations.  Gastrointestinal: Negative for abdominal pain, blood in stool and anal bleeding.  Genitourinary: Negative for dysuria, hematuria and difficulty urinating.  Musculoskeletal: Negative  for joint swelling.  Neurological: Negative for dizziness, syncope, speech difficulty, weakness, numbness and headaches.      Objective:   Physical Exam Vital signs reviewed and stable. GEN: No apparent distress.  Alert and oriented x 3.  HEENT: head is autraumatic and normocephalic.  Neck is supple without palpable masses or lymphadenopathy.  No JVD or carotid bruits.  Vision intact.  EOMI.  PERRLA.  Sclerae anicteric.  Conjunctivae without pallor or injection. Mucous membranes are moist.  Oropharynx is without erythema, exudates, or other abnormal lesions.   RESP:  Lungs are clear to ascultation bilaterally with good air movement.  No wheezes, ronchi, or rubs. CARDIOVASCULAR: regular rate, normal rhythm.  Clear S1, S2, no murmurs, gallops, or rubs. ABDOMEN: soft, non-tender, non-distended.  Bowels sounds present in all quadrants and normoactive.  No edema in bilateral lower extremities. SKIN: warm and dry with normal turgor.  No rashes or abnormal lesions observed. NEURO: CN II-XII grossly intact.  Muscle strength +5/5 in bilateral upper and lower extremities.  Sensation is grossly intact.  No focal deficit.     Assessment:

## 2011-08-08 ENCOUNTER — Encounter: Payer: Self-pay | Admitting: Gastroenterology

## 2011-08-13 NOTE — Assessment & Plan Note (Addendum)
Pt denies suicidal and homicidal ideation.  She admits to numerous symptoms suggestive of depression that began after she stopped taking paxil ~2 weeks ago.  Her underlying depression has likely "flared" following abrupt cessation of her SSRI.  Discussed this at length with pt.  Advised her to resume her paxil; will have her start with one pill daily for ~4-7 days and then increase to one full pill to help minimize gi side effects.  Have advised her to f/u in 2 weeks to assess her response and to ensure she is not becoming suicidal/

## 2011-08-13 NOTE — Assessment & Plan Note (Addendum)
Will resume HCTZ 12.5mg  as pt was tolerating this well and the majority of her complaints today seem related to exacerbation/recurrence of depression following her sudden discontinuation of Paxil 2 weeks ago.  Advised pt to begin taking reduced dose of HCTZ.  Will have her return in 2 weeks for BP check and bmet.  Expect she will need to start an additional antihypertensive agent.

## 2011-08-14 ENCOUNTER — Encounter: Payer: Self-pay | Admitting: Internal Medicine

## 2011-08-14 ENCOUNTER — Ambulatory Visit (INDEPENDENT_AMBULATORY_CARE_PROVIDER_SITE_OTHER): Payer: Self-pay | Admitting: Internal Medicine

## 2011-08-14 VITALS — BP 161/85 | HR 82 | Temp 97.7°F | Ht 63.0 in | Wt 180.5 lb

## 2011-08-14 DIAGNOSIS — I10 Essential (primary) hypertension: Secondary | ICD-10-CM

## 2011-08-14 DIAGNOSIS — M25519 Pain in unspecified shoulder: Secondary | ICD-10-CM

## 2011-08-14 DIAGNOSIS — M25512 Pain in left shoulder: Secondary | ICD-10-CM

## 2011-08-14 MED ORDER — HYDROCHLOROTHIAZIDE 25 MG PO TABS: 25.0000 mg | ORAL_TABLET | Freq: Every day | ORAL | Status: DC

## 2011-08-14 MED ORDER — LISINOPRIL 20 MG PO TABS: 20.0000 mg | ORAL_TABLET | Freq: Every day | ORAL | Status: DC

## 2011-08-14 MED ORDER — MELOXICAM 15 MG PO TABS: 15.0000 mg | ORAL_TABLET | Freq: Every day | ORAL | Status: DC

## 2011-08-14 NOTE — Progress Notes (Signed)
Patient ID: Lori [name], female   DOB: Dec 19, 1955, 56 y.o.   MRN: [id number] HPI:     1. Medication refill: mobic and Norcop "for her left shoulder." States that continues with PT as was pescribed. Denies any worsened or new Sx. 2. HTN. Reports taking HCTZ as Rx.ed. Patient is a smoker. Denies any illicit drug use.  Review of Systems: Negative except per history of present illness  Physical Exam:  Nursing notes and vitals reviewed General:  alert, well-developed, and cooperative to examination.   Lungs:  normal respiratory effort, no accessory muscle use, normal breath sounds, no crackles, and no wheezes. Heart:  normal rate, regular rhythm, no murmurs, no gallop, and no rub.   Abdomen:  soft, non-tender, normal bowel sounds, no distention, no guarding, no rebound tenderness, no hepatomegaly, and no splenomegaly.   Extremities:  No cyanosis, clubbing, edema Neurologic:  alert & oriented X3, nonfocal exam  Meds: Medications Prior to Admission  Medication Sig Dispense Refill  . hydrochlorothiazide (HYDRODIURIL) 12.5 MG tablet Take 1 tablet (12.5 mg total) by mouth daily.  30 tablet  0  . HYDROcodone-acetaminophen (VICODIN) 5-500 MG per tablet Take 1 tablet by mouth every 6 (six) hours as needed for pain.  120 tablet  0  . meloxicam (MOBIC) 15 MG tablet Take 1 tablet (15 mg total) by mouth daily.  30 tablet  2  . omeprazole (PRILOSEC) 40 MG capsule Take 1 capsule (40 mg total) by mouth daily.  30 capsule  3  . PARoxetine (PAXIL) 20 MG tablet Take 1 tablet (20 mg total) by mouth daily.  30 tablet  2  . peg 3350 powder (MOVIPREP) 100 G SOLR MOVI PREP take as directed  1 kit  0  . zolpidem (AMBIEN) 10 MG tablet Take 1 tablet (10 mg total) by mouth at bedtime as needed for sleep.  30 tablet  3  . zolpidem (AMBIEN) 10 MG tablet Take 10 mg by mouth at bedtime as needed.      Marland Kitchen DISCONTD: zolpidem (AMBIEN) 10 MG tablet Take 1 tablet (10 mg total) by mouth at bedtime as needed for sleep.  30 tablet  0    . DISCONTD: FLUoxetine (PROZAC) 40 MG capsule Take 1 capsule (40 mg total) by mouth daily.  30 capsule  3   No current facility-administered medications on file as of 08/14/2011.    Allergies: Review of patient's allergies indicates no known allergies. Past Medical History  Diagnosis Date  . Hypertension 2007    Admitted for HTN crisis in 10/2008. Was given in 2010 a wrong prescription from the pharmacy and per ED note it was Lisinopril-Hydrocholorthizide which gave her a  hives and feeling of sickness. Patient was started  on this meds on 05/31/2010 by Dr  Bradd Canary without any problem.   . Depression with anxiety 2010    Patient had multiple death in her family over a short period of time. father, brother, uncle and niece who was stabbed 3 years ago. Patient's husband had a car accident last year and  need currently a feeding tube.   . Insomnia   . History of cervical cancer  1985     status post partial hysterectomy , last Pap smear 10 years ago , no further followup  . Tobacco abuse      35 years  . GERD (gastroesophageal reflux disease)   . History of cocaine abuse 10/23/2008     last documented in 2007 when admitted for hypertensive urgency  .  Arthritis   . Depression    Past Surgical History  Procedure Date  . Abdominal hysterectomy   . Cesarean section 1985   Family History  Problem Relation Age of Onset  . Thyroid disease Mother   . Diabetes Father   . Heart disease Father   . Hyperlipidemia Father   . Hypertension Father   . Diabetes Brother   . Hypertension Brother   . Hyperlipidemia Brother   . Colon polyps Brother   . Thyroid disease Daughter   . Obesity Daughter   . Mental illness Daughter   . Colon cancer Maternal Uncle    History   Social History  . Marital Status: Single    Spouse Name: N/A    Number of Children: N/A  . Years of Education: N/A   Occupational History  . Not on file.   Social History Main Topics  . Smoking status: Current Some  Day Smoker -- 0.5 packs/day    Types: Cigarettes  . Smokeless tobacco: Never Used  . Alcohol Use: No  . Drug Use: No  . Sexually Active: Not on file   Other Topics Concern  . Not on file   Social History Narrative  . No narrative on file   A/P: 1. HTN, poorly controlled -stop smoking!!! -low salt diet -weight management -restart lisinopril and continue with Hctz  2. Left shoulder pain -continue with PT -Mobic refilled -declined to refill Norco --per Dr. Consuela Mimes, is being Rx-ed and refilled through [facility] clinic.

## 2011-08-14 NOTE — Patient Instructions (Signed)
Please, take all your medications as prescribed. Call with any concerns and follow up with Dr. Loistine Chance in 8-12 weeks or sooner.

## 2011-08-16 ENCOUNTER — Other Ambulatory Visit: Payer: Self-pay | Admitting: *Deleted

## 2011-08-17 ENCOUNTER — Telehealth: Payer: Self-pay | Admitting: *Deleted

## 2011-08-18 MED ORDER — HYDROCODONE-ACETAMINOPHEN 5-500 MG PO TABS: 1.0000 | ORAL_TABLET | Freq: Four times a day (QID) | ORAL | Status: DC | PRN

## 2011-08-18 NOTE — Telephone Encounter (Signed)
Apparently there was some confusion at the recent visit as to the prescription for Vicodin.  Further review of the chart suggests that she is not receiving this from [facility] any longer.  She is getting it from [facility].  Her last UDS was positive for cocaine and she was confronted about this.  Will refill now X 1 and asked that she be scheduled for re-evaluation in the clinic before the next refill will be given.  At that appointment a random UDS should be obtained to assess if she is no longer "exposed" to cocaine.

## 2011-08-18 NOTE — Telephone Encounter (Signed)
Rx called into [facility] pharmacy - pt aware she needs appt with Dr Loistine Chance before 09/17/11 per Dr Josem Kaufmann. Note left for C. Boone to call pt and sch appt per request. Stanton Kidney Emilianna Barlowe RN 08/18/11 5PM

## 2011-08-21 ENCOUNTER — Telehealth: Payer: Self-pay | Admitting: Gastroenterology

## 2011-08-21 NOTE — Telephone Encounter (Signed)
Tried to contact patient regarding giving her a Stage manager. Can not get in touch with patient. Her phone will not accept call from our number Will try and contact [facility]

## 2011-08-25 NOTE — Telephone Encounter (Signed)
CAN NOT GET IN TOUCH WITH PATIENT ABOUT HER PREP

## 2011-08-30 NOTE — Telephone Encounter (Signed)
Opened in error

## 2011-09-01 ENCOUNTER — Other Ambulatory Visit: Payer: Self-pay | Admitting: Internal Medicine

## 2011-09-01 DIAGNOSIS — G4700 Insomnia, unspecified: Secondary | ICD-10-CM

## 2011-09-04 NOTE — Telephone Encounter (Signed)
Rx called to pharmacy

## 2011-09-05 ENCOUNTER — Encounter: Payer: Self-pay | Admitting: Gastroenterology

## 2011-09-05 ENCOUNTER — Ambulatory Visit (AMBULATORY_SURGERY_CENTER): Payer: Self-pay | Admitting: Gastroenterology

## 2011-09-05 VITALS — BP 158/55 | HR 66 | Temp 98.1°F | Resp 24 | Ht 62.5 in | Wt 180.0 lb

## 2011-09-05 DIAGNOSIS — D126 Benign neoplasm of colon, unspecified: Secondary | ICD-10-CM

## 2011-09-05 DIAGNOSIS — Z1211 Encounter for screening for malignant neoplasm of colon: Secondary | ICD-10-CM

## 2011-09-05 MED ORDER — SODIUM CHLORIDE 0.9 % IV SOLN: 500.0000 mL | INTRAVENOUS | Status: DC

## 2011-09-05 NOTE — Progress Notes (Signed)
Patient did not experience any of the following events: a burn prior to discharge; a fall within the facility; wrong site/side/patient/procedure/implant event; or a hospital transfer or hospital admission upon discharge from the facility. (G8907) Patient did not have preoperative order for IV antibiotic SSI prophylaxis. (G8918)  

## 2011-09-05 NOTE — Patient Instructions (Signed)
1 polyp removed and sent to pathology.  See instructions below.  YOU HAD AN ENDOSCOPIC PROCEDURE TODAY AT THE [facility]: Refer to the procedure report that was given to you for any specific questions about what was found during the examination.  If the procedure report does not answer your questions, please call your gastroenterologist to clarify.  If you requested that your care partner not be given the details of your procedure findings, then the procedure report has been included in a sealed envelope for you to review at your convenience later.  YOU SHOULD EXPECT: Some feelings of bloating in the abdomen. Passage of more gas than usual.  Walking can help get rid of the air that was put into your GI tract during the procedure and reduce the bloating. If you had a lower endoscopy (such as a colonoscopy or flexible sigmoidoscopy) you may notice spotting of blood in your stool or on the toilet paper. If you underwent a bowel prep for your procedure, then you may not have a normal bowel movement for a few days.  DIET: Your first meal following the procedure should be a light meal and then it is ok to progress to your normal diet.  A half-sandwich or bowl of soup is an example of a good first meal.  Heavy or fried foods are harder to digest and may make you feel nauseous or bloated.  Likewise meals heavy in dairy and vegetables can cause extra gas to form and this can also increase the bloating.  Drink plenty of fluids but you should avoid alcoholic beverages for 24 hours.  ACTIVITY: Your care partner should take you home directly after the procedure.  You should plan to take it easy, moving slowly for the rest of the day.  You can resume normal activity the day after the procedure however you should NOT DRIVE or use heavy machinery for 24 hours (because of the sedation medicines used during the test).    SYMPTOMS TO REPORT IMMEDIATELY: A gastroenterologist can be reached at any hour.  During  normal business hours, 8:30 AM to 5:00 PM Monday through Friday, call [phone].  After hours and on weekends, please call the GI answering service at [phone] who will take a message and have the physician on call contact you.   Following lower endoscopy (colonoscopy or flexible sigmoidoscopy):  Excessive amounts of blood in the stool  Significant tenderness or worsening of abdominal pains  Swelling of the abdomen that is new, acute  Fever of 100F or higher  FOLLOW UP: If any biopsies were taken you will be contacted by phone or by letter within the next 1-3 weeks.  Call your gastroenterologist if you have not heard about the biopsies in 3 weeks.  Our staff will call the home number listed on your records the next business day following your procedure to check on you and address any questions or concerns that you may have at that time regarding the information given to you following your procedure. This is a courtesy call and so if there is no answer at the home number and we have not heard from you through the emergency physician on call, we will assume that you have returned to your regular daily activities without incident.  SIGNATURES/CONFIDENTIALITY: You and/or your care partner have signed paperwork which will be entered into your electronic medical record.  These signatures attest to the fact that that the information above on your After Visit Summary has been  reviewed and is understood.  Full responsibility of the confidentiality of this discharge information lies with you and/or your care-partner.

## 2011-09-05 NOTE — Progress Notes (Signed)
The pt tolerated the colonoscopy very well. Maw   

## 2011-09-05 NOTE — Op Note (Signed)
[facility] [address] [place], [place]  [address]  COLONOSCOPY PROCEDURE REPORT  PATIENT:  Lori, [name]  MR#:  [id number] BIRTHDATE:  14-Sep-1955, 55 yrs. old  GENDER:  female ENDOSCOPIST:  Barbette Hair. Arlyce Dice, MD REF. BY: PROCEDURE DATE:  09/05/2011 PROCEDURE:  Colonoscopy with snare polypectomy ASA CLASS:  Class II INDICATIONS:  Routine Risk Screening MEDICATIONS:   MAC sedation, administered by CRNA propofol 250mg IV  DESCRIPTION OF PROCEDURE:   After the risks benefits and alternatives of the procedure were thoroughly explained, informed consent was obtained.  Digital rectal exam was performed and revealed no abnormalities.   The LB [id number]-[id number] endoscope was introduced through the anus and advanced to the cecum, which was identified by both the appendix and ileocecal valve, without limitations.  The quality of the prep was good, using MoviPrep. The instrument was then slowly withdrawn as the colon was fully examined. <<PROCEDUREIMAGES>>  FINDINGS:  A sessile polyp was found in the cecum. It was 3 mm in size. Polyp was snared without cautery. Retrieval was successful (see image3). snare polyp  This was otherwise a normal examination of the colon (see image2 and image4).   Retroflexed views in the rectum revealed no abnormalities.    The time to cecum =  1) 4.50 minutes. The scope was then withdrawn in  1) 7.25  minutes from the cecum and the procedure completed. COMPLICATIONS:  None ENDOSCOPIC IMPRESSION: 1) 3 mm sessile polyp in the cecum 2) Otherwise normal examination RECOMMENDATIONS: 1) If the polyp(s) removed today are proven to be adenomatous (pre-cancerous) polyps, you will need a repeat colonoscopy in 5 years. Otherwise you should continue to follow colorectal cancer screening guidelines for "routine risk" patients with colonoscopy in 10 years. You will receive a letter within 1-2 weeks with the results of your biopsy as well as final  recommendations. Please call my office if you have not received a letter after 3 weeks. REPEAT EXAM:  You will receive a letter from Dr. Arlyce Dice in 1-2 weeks, after reviewing the final pathology, with followup recommendations.  ______________________________ Barbette Hair Arlyce Dice, MD  CC:  Nydia Bouton MD  n. Rosalie DoctorBarbette Hair. Savina Olshefski at 09/05/2011 12:10 PM  Edmonia Caprio, [phone]

## 2011-09-05 NOTE — Progress Notes (Signed)
Propofol was administered by Shon Hough, CRNA to the pt. Maw

## 2011-09-06 ENCOUNTER — Telehealth: Payer: Self-pay | Admitting: *Deleted

## 2011-09-06 NOTE — Telephone Encounter (Signed)
  Follow up Call-  Call back number 09/05/2011  Post procedure Call Back phone  # [phone], [phone]  Permission to leave phone message Yes     Patient questions:  Do you have a fever, pain , or abdominal swelling? no Pain Score  0 *  Have you tolerated food without any problems? yes  Have you been able to return to your normal activities? yes  Do you have any questions about your discharge instructions: Diet   no Medications  no Follow up visit  no  Do you have questions or concerns about your Care? no  Actions: * If pain score is 4 or above: No action needed, pain <4.

## 2011-09-13 ENCOUNTER — Encounter: Payer: Self-pay | Admitting: Gastroenterology

## 2011-09-13 ENCOUNTER — Other Ambulatory Visit: Payer: Self-pay | Admitting: Internal Medicine

## 2011-09-13 DIAGNOSIS — G8929 Other chronic pain: Secondary | ICD-10-CM

## 2011-09-13 NOTE — Telephone Encounter (Signed)
Pt called with c/o dizziness when standing also on and off during the day.  She restarted lisinopril, last month  and is taking hctz.   She had BP checked at pharmacy and it was 120 - 140 systolic. Asked pt to come in tomorrow for evaluation of dizziness.

## 2011-09-14 ENCOUNTER — Encounter: Payer: Self-pay | Admitting: Ophthalmology

## 2011-09-14 ENCOUNTER — Ambulatory Visit (INDEPENDENT_AMBULATORY_CARE_PROVIDER_SITE_OTHER): Payer: Self-pay | Admitting: Ophthalmology

## 2011-09-14 VITALS — BP 171/103 | HR 93 | Temp 97.6°F | Ht 63.0 in | Wt 179.6 lb

## 2011-09-14 DIAGNOSIS — I10 Essential (primary) hypertension: Secondary | ICD-10-CM

## 2011-09-14 DIAGNOSIS — R42 Dizziness and giddiness: Secondary | ICD-10-CM

## 2011-09-14 DIAGNOSIS — F329 Major depressive disorder, single episode, unspecified: Secondary | ICD-10-CM

## 2011-09-14 MED ORDER — MECLIZINE HCL 12.5 MG PO TABS: 12.5000 mg | ORAL_TABLET | Freq: Three times a day (TID) | ORAL | Status: AC | PRN

## 2011-09-14 MED ORDER — FLUOXETINE HCL 20 MG PO CAPS: 20.0000 mg | ORAL_CAPSULE | Freq: Every day | ORAL | Status: DC

## 2011-09-14 NOTE — Patient Instructions (Addendum)
-  STOP the paxil  -START taking prozac (fluoxetine again) -STOP the lisinopril -Take a full pill of the HCTZ

## 2011-09-14 NOTE — Assessment & Plan Note (Signed)
Of unclear cause. Will stop paxil and restart lo-dose prozac since this can cause dizziness and she tolerated prozac well. Reason for switching was unclear. Will stop  Lisinopril until next visit and see if this helps, will likely need to add another agent at this point. Prescribe meclizine to help symptomatically. Could consider neurological cause and MRI if problem continues. Some psychological component likely since patient is upset and hasn't been sleeping or eating well.

## 2011-09-14 NOTE — Progress Notes (Signed)
Subjective:   Patient ID: Lori [name] female   DOB: [birth year] 56 y.o.   MRN: [id number]  HPI: [name] is a 56 y.o. woman who presents for evaluation of dizziness. Orthostatics were checked by Lori [name] and patient's BPs went up slightly, not down with standing up. Pulse unchanged.  She says she has had several spells when she has felt dizzy and nearly fallen over and has had to grab something for support, once was when she closed her eyes in the shower, other times right after standing up, other times while she is walking, will suddenly get dizzy. Has been happening since Friday. Switched to taking 1/2 pill HCTZ  one month ago and 1/2 pill of lisinopril on Monday.  No vertigo. No tunnel vision or hearing fading. Was maybe once or twice a day. Had restrarted lisinopril on last visit. Patient is convinced that lisinopril is the cause of her problems since she started in on Monday and her problems began on Friday. Reports nausea as well. Feels it is related to taking lisinopril (though she was on this medication in a different package for several months last year with no problem)  +Ringing in ears, few times in past week, high pitched Whole body feels weak, since Friday. Was her birthday on [birth year]. Denies having a bad birthday or feeling especially down on that day.  Paroxetine, was on it for a month. Stopped it 1 week ago. Was on prozac for 2 years with no side effects.  Shoulder- is taking vicodin for shoulder pain. Was called into pharmacy yesterday, she wants it to go to Lori [name] instead.  Past Medical History  Diagnosis Date  . Hypertension 2007    Admitted for HTN crisis in 10/2008. Was given in 2010 a wrong prescription from the pharmacy and per ED note it was Lisinopril-Hydrocholorthizide which gave her a  hives and feeling of sickness. Patient was started  on this meds on 05/31/2010 by Dr  Lori [name] without any problem.   . Depression with anxiety 2010    Patient had multiple death in  her family over a short period of time. father, brother, uncle and niece who was stabbed 3 years ago. Patient's husband had a car accident last year and  need currently a feeding tube.   . Insomnia   . History of cervical cancer  1985     status post partial hysterectomy , last Pap smear 10 years ago , no further followup  . Tobacco abuse      35 years  . GERD (gastroesophageal reflux disease)   . History of cocaine abuse 10/23/2008     last documented in 2007 when admitted for hypertensive urgency  . Arthritis   . Depression    Current Outpatient Prescriptions  Medication Sig Dispense Refill  . hydrochlorothiazide (HYDRODIURIL) 25 MG tablet Take 1 tablet (25 mg total) by mouth daily.  30 tablet  11  . HYDROcodone-acetaminophen (VICODIN) 5-500 MG per tablet TAKE 1 TABLET BY MOUTH EVERY 6 HOURS AS NEEDED FOR PAIN  120 tablet  0  . lisinopril (PRINIVIL,ZESTRIL) 20 MG tablet Take 1 tablet (20 mg total) by mouth daily.  30 tablet  11  . meloxicam (MOBIC) 15 MG tablet Take 1 tablet (15 mg total) by mouth daily.  30 tablet  11  . omeprazole (PRILOSEC) 40 MG capsule Take 1 capsule (40 mg total) by mouth daily.  30 capsule  3  . PARoxetine (PAXIL) 20 MG tablet Take 1 tablet (20 mg total)  by mouth daily.  30 tablet  2  . zolpidem (AMBIEN) 10 MG tablet Take 1 tablet (10 mg total) by mouth at bedtime as needed for sleep.  30 tablet  3  . zolpidem (AMBIEN) 10 MG tablet Take 10 mg by mouth at bedtime as needed.      . zolpidem (AMBIEN) 10 MG tablet TAKE 1 TABLET BY MOUTH AT BEDTIME AS NEEDED FOR SLEEP  30 tablet  3  . DISCONTD: FLUoxetine (PROZAC) 40 MG capsule Take 1 capsule (40 mg total) by mouth daily.  30 capsule  3   Family History  Problem Relation Age of Onset  . Thyroid disease Mother   . Diabetes Father   . Heart disease Father   . Hyperlipidemia Father   . Hypertension Father   . Diabetes Brother   . Hypertension Brother   . Hyperlipidemia Brother   . Colon polyps Brother   . Thyroid  disease Daughter   . Obesity Daughter   . Mental illness Daughter   . Colon cancer Maternal Uncle    History   Social History  . Marital Status: Single    Spouse Name: N/A    Number of Children: N/A  . Years of Education: N/A   Social History Main Topics  . Smoking status: Current Some Day Smoker -- 0.5 packs/day    Types: Cigarettes  . Smokeless tobacco: Never Used  . Alcohol Use: No  . Drug Use: No  . Sexually Active: None   Other Topics Concern  . None   Social History Narrative  . None   Objective:  Physical Exam: Filed Vitals:   09/14/11 1425 09/14/11 1434 09/14/11 1437 09/14/11 1438  BP: 151/94 168/76 160/86 171/103  Pulse: 93 93 93 93  Temp: 97.6 F (36.4 C)     TempSrc: Oral     Height: 5\' 3"  (1.6 m)     Weight: 179 lb 9.6 oz (81.466 kg)     SpO2: 98%      General: sad, tearful appearing woman sitting in chair HEENT: PERRL, EOMI, no scleral icterus, patient reports subjectively reduced hearing in her right ear to finger rub, she seems to get dizzy with turning to the right, no dizziness induced by moving gaze, no nystagmus Cardiac: RRR, no rubs, murmurs or gallops Pulm: clear to auscultation bilaterally, moving normal volumes of air Ext: warm and well perfused, no pedal edema Neuro: alert and oriented X3, cranial nerves II-XII grossly intact, patient is unstable and falls a bit to the right when I have her close her eyes  Assessment & Plan:

## 2011-09-14 NOTE — Assessment & Plan Note (Signed)
Will stop lisinopril and told patient to take 25mg  of HCTZ not 12.5 as she has been doing. BP is not well controlled today but given acute dizziness will allow moderate hypertension at this point. She will likely need additional BP med at next visit.

## 2011-09-14 NOTE — Telephone Encounter (Signed)
Will need to obtain UDS during next office visit.

## 2011-09-14 NOTE — Telephone Encounter (Signed)
Rx called to pharmacy and to fill 09/17/2011.

## 2011-09-14 NOTE — Telephone Encounter (Signed)
Rx called to pharmacy and fill 09/17/11.

## 2011-09-14 NOTE — Assessment & Plan Note (Signed)
Stop paxil (patient had already stopped) and re-start prozac. Discussed how her acute crying and sleeping and eating problems were likely related to stopping medication abruptly. She says that her pastor in church talks about people being on medications unnecessarily.

## 2011-09-15 LAB — CBC
HCT: 42.8 % (ref 36.0–46.0)
Hemoglobin: 14 g/dL (ref 12.0–15.0)
MCH: 32.9 pg (ref 26.0–34.0)
MCHC: 32.7 g/dL (ref 30.0–36.0)
MCV: 100.7 fL — ABNORMAL HIGH (ref 78.0–100.0)
Platelets: 344 10*3/uL (ref 150–400)
RBC: 4.25 MIL/uL (ref 3.87–5.11)
RDW: 13.8 % (ref 11.5–15.5)
WBC: 10.8 10*3/uL — ABNORMAL HIGH (ref 4.0–10.5)

## 2011-09-15 LAB — BASIC METABOLIC PANEL
BUN: 14 mg/dL (ref 6–23)
CO2: 23 mEq/L (ref 19–32)
Calcium: 10.6 mg/dL — ABNORMAL HIGH (ref 8.4–10.5)
Chloride: 100 mEq/L (ref 96–112)
Creat: 0.99 mg/dL (ref 0.50–1.10)
Glucose, Bld: 133 mg/dL — ABNORMAL HIGH (ref 70–99)
Potassium: 4.5 mEq/L (ref 3.5–5.3)
Sodium: 137 mEq/L (ref 135–145)

## 2011-09-19 NOTE — Telephone Encounter (Signed)
Talked with Lanora Manis at [facility] pharmacy  about Rx - states does not have on record call to ok pain med 09/14/11 - to hold 09/17/11. [employer] card has expired and if Rx was called to another pharmacy by OP pharmacy - they would have made a note. Rx was given to [facility] 09/19/11 - cand not back date to 09/14/11. Stanton Kidney Carlei Huang RN 09/19/11 4:30PM

## 2011-09-21 ENCOUNTER — Encounter: Payer: Self-pay | Admitting: Ophthalmology

## 2011-09-25 ENCOUNTER — Encounter: Payer: Self-pay | Admitting: Ophthalmology

## 2011-10-04 ENCOUNTER — Other Ambulatory Visit: Payer: Self-pay | Admitting: *Deleted

## 2011-10-04 DIAGNOSIS — I10 Essential (primary) hypertension: Secondary | ICD-10-CM

## 2011-10-04 DIAGNOSIS — G4700 Insomnia, unspecified: Secondary | ICD-10-CM

## 2011-10-05 ENCOUNTER — Other Ambulatory Visit: Payer: Self-pay | Admitting: *Deleted

## 2011-10-05 DIAGNOSIS — I10 Essential (primary) hypertension: Secondary | ICD-10-CM

## 2011-10-05 MED ORDER — LISINOPRIL 20 MG PO TABS: 20.0000 mg | ORAL_TABLET | Freq: Every day | ORAL | Status: DC

## 2011-10-05 NOTE — Telephone Encounter (Signed)
Rx called in to pharmacy. 

## 2011-10-09 ENCOUNTER — Other Ambulatory Visit: Payer: Self-pay | Admitting: *Deleted

## 2011-10-09 MED ORDER — ZOLPIDEM TARTRATE 10 MG PO TABS: 10.0000 mg | ORAL_TABLET | Freq: Every evening | ORAL | Status: DC | PRN

## 2011-10-09 NOTE — Telephone Encounter (Signed)
See previous refill request.

## 2011-10-09 NOTE — Telephone Encounter (Signed)
Ambien rx called to [facility] pharmacy. 

## 2011-10-16 ENCOUNTER — Other Ambulatory Visit: Payer: Self-pay | Admitting: *Deleted

## 2011-10-16 DIAGNOSIS — G8929 Other chronic pain: Secondary | ICD-10-CM

## 2011-10-16 MED ORDER — HYDROCODONE-ACETAMINOPHEN 5-500 MG PO TABS: 1.0000 | ORAL_TABLET | Freq: Four times a day (QID) | ORAL | Status: DC | PRN

## 2011-10-16 NOTE — Telephone Encounter (Signed)
Rx called in 

## 2011-10-16 NOTE — Telephone Encounter (Signed)
Last refill per pharmacy 5/9 Call pt when ready [phone]

## 2011-10-23 ENCOUNTER — Encounter: Payer: Self-pay | Admitting: Internal Medicine

## 2011-10-31 ENCOUNTER — Other Ambulatory Visit: Payer: Self-pay | Admitting: *Deleted

## 2011-10-31 DIAGNOSIS — G8929 Other chronic pain: Secondary | ICD-10-CM

## 2011-10-31 NOTE — Telephone Encounter (Signed)
Pt states she received only #30 tabs the last rx; takes it q6h.  Wants to know why only #30?

## 2011-10-31 NOTE — Telephone Encounter (Signed)
Pt has an appt scheduled 11/20/11 w/Dr Loistine Chance. States she has seen several doctors since her last visit w/Dr Loistine Chance in Feb and could not Afford anymore office visits, which was why she cancelled the last visit. States she's completely out of pain medication.

## 2011-11-01 NOTE — Telephone Encounter (Signed)
Pt was called and made awared of Dr Gwenlyn Fudge response to her refill request; instructed to keep her July appt.

## 2011-11-20 ENCOUNTER — Encounter: Payer: Self-pay | Admitting: Internal Medicine

## 2011-11-20 ENCOUNTER — Ambulatory Visit (INDEPENDENT_AMBULATORY_CARE_PROVIDER_SITE_OTHER): Payer: Self-pay | Admitting: Internal Medicine

## 2011-11-20 VITALS — BP 143/75 | HR 88 | Temp 97.3°F | Ht 63.0 in | Wt 178.0 lb

## 2011-11-20 DIAGNOSIS — I10 Essential (primary) hypertension: Secondary | ICD-10-CM

## 2011-11-20 DIAGNOSIS — F17200 Nicotine dependence, unspecified, uncomplicated: Secondary | ICD-10-CM

## 2011-11-20 DIAGNOSIS — M25519 Pain in unspecified shoulder: Secondary | ICD-10-CM

## 2011-11-20 DIAGNOSIS — R05 Cough: Secondary | ICD-10-CM | POA: Insufficient documentation

## 2011-11-20 DIAGNOSIS — F329 Major depressive disorder, single episode, unspecified: Secondary | ICD-10-CM

## 2011-11-20 DIAGNOSIS — M25512 Pain in left shoulder: Secondary | ICD-10-CM

## 2011-11-20 DIAGNOSIS — R059 Cough, unspecified: Secondary | ICD-10-CM | POA: Insufficient documentation

## 2011-11-20 DIAGNOSIS — G8929 Other chronic pain: Secondary | ICD-10-CM

## 2011-11-20 DIAGNOSIS — G4700 Insomnia, unspecified: Secondary | ICD-10-CM

## 2011-11-20 DIAGNOSIS — R42 Dizziness and giddiness: Secondary | ICD-10-CM

## 2011-11-20 LAB — COMPREHENSIVE METABOLIC PANEL
ALT: 9 U/L (ref 0–35)
AST: 15 U/L (ref 0–37)
Albumin: 4.6 g/dL (ref 3.5–5.2)
Alkaline Phosphatase: 76 U/L (ref 39–117)
BUN: 10 mg/dL (ref 6–23)
CO2: 26 mEq/L (ref 19–32)
Calcium: 10.3 mg/dL (ref 8.4–10.5)
Chloride: 104 mEq/L (ref 96–112)
Creat: 0.92 mg/dL (ref 0.50–1.10)
Glucose, Bld: 96 mg/dL (ref 70–99)
Potassium: 4.6 mEq/L (ref 3.5–5.3)
Sodium: 140 mEq/L (ref 135–145)
Total Bilirubin: 0.3 mg/dL (ref 0.3–1.2)
Total Protein: 7.6 g/dL (ref 6.0–8.3)

## 2011-11-20 MED ORDER — CYCLOBENZAPRINE HCL 10 MG PO TABS: 10.0000 mg | ORAL_TABLET | Freq: Every day | ORAL | Status: DC

## 2011-11-20 MED ORDER — IBUPROFEN 200 MG PO TABS: 200.0000 mg | ORAL_TABLET | Freq: Four times a day (QID) | ORAL | Status: DC | PRN

## 2011-11-20 MED ORDER — ACETAMINOPHEN 325 MG PO TABS: 325.0000 mg | ORAL_TABLET | Freq: Four times a day (QID) | ORAL | Status: DC | PRN

## 2011-11-20 MED ORDER — FLUOXETINE HCL 40 MG PO CAPS: 40.0000 mg | ORAL_CAPSULE | Freq: Every day | ORAL | Status: DC

## 2011-11-20 NOTE — Assessment & Plan Note (Signed)
Concerning for COPD with long history of smoking. I will readdress during the next office visit. Patient may need PFTs.

## 2011-11-20 NOTE — Assessment & Plan Note (Signed)
The patient reports that her dizziness had resolved after stopping lisinopril. We'll continue monitor her.

## 2011-11-20 NOTE — Progress Notes (Signed)
Subjective:   Patient ID: Lori [name] female   DOB: 25-Mar-1956 56 y.o.   MRN: [id number]  HPI: [name] is a 56 y.o.  Female with past medical history significant as outlined below who presented to the clinic for a followup. Her main concern is her shoulder pain.  1. Shoulder pain: The pain is persistent. Right now she noted 8/10 in severity. She continues to have numbness and tingling in her hand. She is currently taking ibuprofen 400 mg on a regular basis. Furthermore patient is taking Vicodin 5- 500 mg half a tablet every 6 hours which somewhat keeps her pain under control. She was evaluated by physical therapy who recommended home exercises. Patient reports that she sees some improvement but occasionally the pain worsened and then she is not able to do any more exercises. 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  2. Insomnia: Ambien is working well. She is now able to sleep throughout the night.  3. Dizziness resolved after stopping lisinopril  4. Depression; after starting on Prozac 20 mg patient noted some improvement in her mood. Nevertheless she continues to have episodes of crying, feeling of worthlessness and guilt. She further noted that there was a lot of things going on recently. Her daughter and an emergent surgery for cholecystectomy a few weeks ago. Her brother age 63 had an MI last week.  5. Cough: Patient reports about a nonproductive cough which has been present for some time. She also noted some postnasal dripping. Denies any recent sick contact or travel. Noted occasionally some mild shortness of breath.   6. Smoking: 1/2 ppd a day. Is planning to quit by 12/16/11 . Since she can not smoke at the beach.       Past Medical History  Diagnosis Date  . Hypertension 2007    Admitted for HTN crisis in 10/2008. Was given in 2010 a wrong prescription from the pharmacy and per ED note it was Lisinopril-Hydrocholorthizide which gave her a   hives and feeling of sickness. Patient was started  on this meds on 05/31/2010 by Dr  Bradd Canary without any problem.   . Depression with anxiety 2010    Patient had multiple death in her family over a short period of time. father, brother, uncle and niece who was stabbed 3 years ago. Patient's husband had a car accident last year and  need currently a feeding tube.   . Insomnia   . History of cervical cancer  1985     status post partial hysterectomy , last Pap smear 10 years ago , no further followup  . Tobacco abuse      35 years  . GERD (gastroesophageal reflux disease)   . History of cocaine abuse 10/23/2008     last documented in 2007 when admitted for hypertensive urgency  . Arthritis   . Depression    Current Outpatient Prescriptions  Medication Sig Dispense Refill  . FLUoxetine (PROZAC) 20 MG capsule Take 1 capsule (20 mg total) by mouth daily.  30 capsule  2  . hydrochlorothiazide (HYDRODIURIL) 25 MG tablet Take 1 tablet (25 mg total) by mouth daily.  30 tablet  11  . HYDROcodone-acetaminophen (VICODIN) 5-500 MG per tablet Take 1 tablet by mouth every 6 (six) hours as needed for pain.  30 tablet  0  . lisinopril (PRINIVIL,ZESTRIL) 20 MG tablet Take 1 tablet (20 mg total) by mouth daily.  30 tablet  3  . meloxicam (MOBIC)  15 MG tablet Take 1 tablet (15 mg total) by mouth daily.  30 tablet  11  . omeprazole (PRILOSEC) 40 MG capsule Take 1 capsule (40 mg total) by mouth daily.  30 capsule  3  . zolpidem (AMBIEN) 10 MG tablet Take 1 tablet (10 mg total) by mouth at bedtime as needed for sleep.  30 tablet  3   Family History  Problem Relation Age of Onset  . Thyroid disease Mother   . Diabetes Father   . Heart disease Father   . Hyperlipidemia Father   . Hypertension Father   . Diabetes Brother   . Hypertension Brother   . Hyperlipidemia Brother   . Colon polyps Brother   . Thyroid disease Daughter   . Obesity Daughter   . Mental illness Daughter   . Colon cancer Maternal  Uncle    History   Social History  . Marital Status: Single    Spouse Name: N/A    Number of Children: N/A  . Years of Education: N/A   Social History Main Topics  . Smoking status: Current Some Day Smoker -- 0.5 packs/day    Types: Cigarettes  . Smokeless tobacco: Never Used  . Alcohol Use: No  . Drug Use: No  . Sexually Active: Not on file   Other Topics Concern  . Not on file   Social History Narrative  . No narrative on file   Review of Systems: Constitutional: Denies fever, chills, diaphoresis, appetite change and fatigue.  HEENT: Noted  congestion, rhinorrhea, sneezing, Respiratory: Mild SOB, DOE, cough, chest tightness,  and wheezing.   Cardiovascular: Denies chest pain,  Gastrointestinal: Occasionally nausea, vomiting, abdominal pain, diarrhea,  Skin: Denies pallor, rash and wound.  Neurological: Denies dizziness,  Syncope Psych: No suicidal or homicidal ideation  Objective:  Physical Exam: Filed Vitals:   11/20/11 1316 11/20/11 1331  BP: 157/92 143/75  Pulse: 84 88  Temp: 97.3 F (36.3 C)   TempSrc: Oral   Height: 5\' 3"  (1.6 m)   Weight: 178 lb (80.74 kg)    Constitutional: Vital signs reviewed.  Patient is a well-developed and well-nourished woman in no acute distress and cooperative with exam. Alert and oriented x3.  Nose: boggy, erythematous membrane  Mouth: poor dentition, no erythema or exudates, MMM Neck: Supple  Cardiovascular: RRR, S1 normal, S2 normal, no MRG, pulses symmetric and intact bilaterally Pulmonary/Chest: Few wheezes at the basis. Good air movement at the basis.  Abdominal: Soft. Non-tender, non-distended, bowel sounds are normal,  Musculoskeletal: Left shoulder: Significant decreased range of motion due to pain. Not able to abduct 90. A good to do internal or external rotation do to pain. No joint deformities, erythema. Right shoulder ; within normal limits  Neurological: A&O x3,  Skin: Warm, dry and intact. No rash, cyanosis, or  clubbing.  Psychiatric: Normal mood and affect. speech and behavior is normal.

## 2011-11-20 NOTE — Assessment & Plan Note (Addendum)
Ambien is working well for her. She feels that she has to well rested sleep. She denies any frequent awakening or bizarre behaviors. I will continue Ambien 10 mg daily.

## 2011-11-20 NOTE — Assessment & Plan Note (Signed)
Blood pressure continues to be mildly elevated. I believe this is related to pain and some anxiety component. I will continue current regimen with hydrochlorothiazide 25 mg daily. We'll continue to hold lisinopril since this caused her dizziness. Furthermore she complains about chronic cough which I don't want to aggravate.

## 2011-11-20 NOTE — Assessment & Plan Note (Signed)
Currently half a pack a day. She's planning to quit by 12/16/11 since she is going to the beach where she does not want to smoke. She is aware of the 1-800-Quit- Now hotline.

## 2011-11-20 NOTE — Patient Instructions (Signed)
Please take Ibuprofen and Acetaminophen (Tylenol) as needed between the Hydrocodone . Please take Flexeril every day.

## 2011-11-20 NOTE — Assessment & Plan Note (Signed)
Persistent shoulder pain. It seems that she still needs Vicodin on a regular basis but less than in the past. Her last dose of Vicodin was this morning. She has been cutting and a half. Her last refill was on 10/16/11 where she was given 30 tablets. Which means she was able to use only 30 tablet within one month which is a significant improvement. My goal is to discontinue the Vicodin continue future. Therefore I will start the patient on Tylenol when necessary, ibuprofen when necessary, Flexeril 10 mg daily. I will obtain a urine drug screen. If this is unremarkable I will refill her Vicodin for a month without any refills. She is planning to go for that occasion after a very long time and I will make sure that she has some pain medication with her so she can enjoy it. After that I will cut further down her Narcotics.

## 2011-11-21 LAB — PRESCRIPTION ABUSE MONITORING 15P, URINE
Amphetamine/Meth: NEGATIVE ng/mL
Barbiturate Screen, Urine: NEGATIVE ng/mL
Benzodiazepine Screen, Urine: NEGATIVE ng/mL
Buprenorphine, Urine: NEGATIVE ng/mL
Cannabinoid Scrn, Ur: NEGATIVE ng/mL
Carisoprodol, Urine: NEGATIVE ng/mL
Creatinine, Urine: 31.85 mg/dL (ref >20.0)
Fentanyl, Ur: NEGATIVE ng/mL
Meperidine, Ur: NEGATIVE ng/mL
Methadone Screen, Urine: NEGATIVE ng/mL
Oxycodone Screen, Ur: NEGATIVE ng/mL
Propoxyphene: NEGATIVE ng/mL
Tramadol Scrn, Ur: NEGATIVE ng/mL

## 2011-11-21 LAB — VITAMIN B12: Vitamin B-12: 504 pg/mL (ref 211–911)

## 2011-11-22 ENCOUNTER — Other Ambulatory Visit: Payer: Self-pay | Admitting: *Deleted

## 2011-11-22 DIAGNOSIS — G8929 Other chronic pain: Secondary | ICD-10-CM

## 2011-11-22 NOTE — Telephone Encounter (Signed)
UDS results are back.  Will you refill the vicodin?

## 2011-11-23 ENCOUNTER — Other Ambulatory Visit: Payer: Self-pay | Admitting: *Deleted

## 2011-11-23 DIAGNOSIS — G8929 Other chronic pain: Secondary | ICD-10-CM

## 2011-11-23 LAB — COCAINE METABOLITE (GC/LC/MS), URINE: Benzoylecgonine GC/MS Conf: 223 NG/ML — ABNORMAL HIGH

## 2011-11-23 LAB — OPIATES/OPIOIDS (LC/MS-MS)
Codeine Urine: NEGATIVE NG/ML
Heroin (6-AM), UR: NEGATIVE NG/ML
Hydrocodone: NEGATIVE NG/ML
Hydromorphone: NEGATIVE NG/ML
Morphine Urine: NEGATIVE NG/ML
Oxycodone, ur: NEGATIVE NG/ML
Oxymorphone: NEGATIVE NG/ML

## 2011-11-23 LAB — ZOLPIDEM (LC/MS-MS), URINE: Zolpidem, Urine: NEGATIVE NG/ML

## 2011-11-23 NOTE — Telephone Encounter (Signed)
I am waiting for complete UDS results which is still pending

## 2011-11-23 NOTE — Telephone Encounter (Signed)
I am waiting for the final results which is not yet back.

## 2011-11-24 ENCOUNTER — Other Ambulatory Visit: Payer: Self-pay | Admitting: Internal Medicine

## 2011-11-24 NOTE — Telephone Encounter (Signed)
I called patient to discuss about pain medication refill and abnormal UDS but she did not pick up  the phone. I left a message to call back the clinic until 4 pm today.

## 2011-12-05 ENCOUNTER — Telehealth: Payer: Self-pay | Admitting: *Deleted

## 2011-12-05 NOTE — Telephone Encounter (Signed)
Discussed with patient that her UDS was abnormal and she has violated her contract 2 times now. I informed her that she will be not able to get any medication from [place] until I see her back. At that point I will reevaluate her and may consider to restart . The patient understood. I advised her to take Acetaminophen and Ibuprofen as needed for pain and continue home exercise per PT.

## 2011-12-05 NOTE — Telephone Encounter (Addendum)
Pt called asking about her pain medication.  I see where Dr Loistine Chance tried to reach pt about her pain med but no answer on 7/19. What can I tell the patient?  Will she need appointment for this?  Pt # [phone]

## 2012-02-26 ENCOUNTER — Other Ambulatory Visit: Payer: Self-pay | Admitting: *Deleted

## 2012-02-26 DIAGNOSIS — G4700 Insomnia, unspecified: Secondary | ICD-10-CM

## 2012-02-26 MED ORDER — ZOLPIDEM TARTRATE 10 MG PO TABS: 10.0000 mg | ORAL_TABLET | Freq: Every evening | ORAL | Status: DC | PRN

## 2012-02-26 NOTE — Telephone Encounter (Signed)
Called to pharm 

## 2012-04-01 ENCOUNTER — Encounter: Payer: Self-pay | Admitting: Internal Medicine

## 2012-04-16 ENCOUNTER — Encounter: Payer: Self-pay | Admitting: Internal Medicine

## 2012-06-10 ENCOUNTER — Encounter: Payer: Self-pay | Admitting: Internal Medicine

## 2012-06-12 ENCOUNTER — Telehealth: Payer: Self-pay | Admitting: *Deleted

## 2012-06-12 ENCOUNTER — Observation Stay (HOSPITAL_COMMUNITY)
Admission: AD | Admit: 2012-06-12 | Discharge: 2012-06-13 | Disposition: A | Discharge status: Home / Self Care | Payer: Self-pay | Source: Ambulatory Visit | Attending: Internal Medicine | Admitting: Internal Medicine

## 2012-06-12 ENCOUNTER — Encounter: Payer: Self-pay | Admitting: Internal Medicine

## 2012-06-12 ENCOUNTER — Ambulatory Visit (INDEPENDENT_AMBULATORY_CARE_PROVIDER_SITE_OTHER): Payer: Self-pay | Admitting: Internal Medicine

## 2012-06-12 ENCOUNTER — Encounter (HOSPITAL_COMMUNITY): Payer: Self-pay

## 2012-06-12 VITALS — BP 160/93 | HR 93 | Temp 98.7°F | Resp 24 | Ht 62.0 in | Wt 173.8 lb

## 2012-06-12 DIAGNOSIS — F329 Major depressive disorder, single episode, unspecified: Secondary | ICD-10-CM

## 2012-06-12 DIAGNOSIS — R197 Diarrhea, unspecified: Secondary | ICD-10-CM | POA: Insufficient documentation

## 2012-06-12 DIAGNOSIS — M25512 Pain in left shoulder: Secondary | ICD-10-CM

## 2012-06-12 DIAGNOSIS — G4700 Insomnia, unspecified: Secondary | ICD-10-CM

## 2012-06-12 DIAGNOSIS — I10 Essential (primary) hypertension: Secondary | ICD-10-CM | POA: Insufficient documentation

## 2012-06-12 DIAGNOSIS — K529 Noninfective gastroenteritis and colitis, unspecified: Secondary | ICD-10-CM | POA: Diagnosis present

## 2012-06-12 DIAGNOSIS — R109 Unspecified abdominal pain: Secondary | ICD-10-CM

## 2012-06-12 DIAGNOSIS — F3289 Other specified depressive episodes: Secondary | ICD-10-CM | POA: Insufficient documentation

## 2012-06-12 DIAGNOSIS — A088 Other specified intestinal infections: Principal | ICD-10-CM | POA: Insufficient documentation

## 2012-06-12 DIAGNOSIS — R112 Nausea with vomiting, unspecified: Secondary | ICD-10-CM

## 2012-06-12 DIAGNOSIS — K219 Gastro-esophageal reflux disease without esophagitis: Secondary | ICD-10-CM | POA: Insufficient documentation

## 2012-06-12 HISTORY — DX: Headache: R51

## 2012-06-12 LAB — COMPREHENSIVE METABOLIC PANEL
ALT: 20 U/L (ref 0–35)
AST: 31 U/L (ref 0–37)
Albumin: 4.6 g/dL (ref 3.5–5.2)
Alkaline Phosphatase: 101 U/L (ref 39–117)
BUN: 8 mg/dL (ref 6–23)
CO2: 22 mEq/L (ref 19–32)
Calcium: 10.2 mg/dL (ref 8.4–10.5)
Chloride: 101 mEq/L (ref 96–112)
Creat: 0.8 mg/dL (ref 0.50–1.10)
Glucose, Bld: 114 mg/dL — ABNORMAL HIGH (ref 70–99)
Potassium: 3.8 mEq/L (ref 3.5–5.3)
Sodium: 137 mEq/L (ref 135–145)
Total Bilirubin: 0.4 mg/dL (ref 0.3–1.2)
Total Protein: 8.6 g/dL — ABNORMAL HIGH (ref 6.0–8.3)

## 2012-06-12 LAB — CBC
HCT: 42.3 % (ref 36.0–46.0)
HCT: 39.5 % (ref 36.0–46.0)
Hemoglobin: 15.1 g/dL — ABNORMAL HIGH (ref 12.0–15.0)
Hemoglobin: 13.2 g/dL (ref 12.0–15.0)
MCH: 35 pg — ABNORMAL HIGH (ref 26.0–34.0)
MCH: 32.7 pg (ref 26.0–34.0)
MCHC: 35.7 g/dL (ref 30.0–36.0)
MCHC: 33.4 g/dL (ref 30.0–36.0)
MCV: 97.9 fL (ref 78.0–100.0)
MCV: 97.8 fL (ref 78.0–100.0)
Platelets: 403 K/uL — ABNORMAL HIGH (ref 150–400)
Platelets: 259 10*3/uL (ref 150–400)
RBC: 4.32 MIL/uL (ref 3.87–5.11)
RBC: 4.04 MIL/uL (ref 3.87–5.11)
RDW: 13 % (ref 11.5–15.5)
RDW: 12.7 % (ref 11.5–15.5)
WBC: 9.1 K/uL (ref 4.0–10.5)
WBC: 7.1 10*3/uL (ref 4.0–10.5)

## 2012-06-12 LAB — URINALYSIS, ROUTINE W REFLEX MICROSCOPIC
Bilirubin Urine: NEGATIVE
Glucose, UA: NEGATIVE mg/dL
Hgb urine dipstick: NEGATIVE
Leukocytes, UA: NEGATIVE
Nitrite: NEGATIVE
Specific Gravity, Urine: 1.014 (ref 1.005–1.030)
Urobilinogen, UA: 0.2 mg/dL (ref 0.0–1.0)
pH: 6 (ref 5.0–8.0)

## 2012-06-12 LAB — CREATININE, SERUM
Creatinine, Ser: 0.78 mg/dL (ref 0.50–1.10)
GFR calc Af Amer: >90 mL/min (ref >90)
GFR calc non Af Amer: >90 mL/min (ref >90)

## 2012-06-12 MED ORDER — ZOLPIDEM TARTRATE 5 MG PO TABS
5.0000 mg | ORAL_TABLET | Freq: Every evening | ORAL | Status: DC | PRN
  Administered 2012-06-13: 5 mg via ORAL
  Filled 2012-06-12: qty 1

## 2012-06-12 MED ORDER — ALUM & MAG HYDROXIDE-SIMETH 200-200-20 MG/5ML PO SUSP: 30.0000 mL | Freq: Four times a day (QID) | ORAL | Status: DC | PRN

## 2012-06-12 MED ORDER — GI COCKTAIL ~~LOC~~
30.0000 mL | Freq: Once | ORAL | Status: AC
  Administered 2012-06-12: 30 mL via ORAL
  Filled 2012-06-12: qty 30

## 2012-06-12 MED ORDER — SODIUM CHLORIDE 0.9 % IV SOLN
INTRAVENOUS | Status: DC
  Administered 2012-06-13: via INTRAVENOUS

## 2012-06-12 MED ORDER — KETOROLAC TROMETHAMINE 30 MG/ML IJ SOLN
30.0000 mg | Freq: Four times a day (QID) | INTRAMUSCULAR | Status: DC | PRN
  Filled 2012-06-12: qty 1

## 2012-06-12 MED ORDER — ONDANSETRON HCL 4 MG/2ML IJ SOLN: 4.0000 mg | Freq: Four times a day (QID) | INTRAMUSCULAR | Status: DC | PRN

## 2012-06-12 MED ORDER — ACETAMINOPHEN 325 MG PO TABS
650.0000 mg | ORAL_TABLET | Freq: Four times a day (QID) | ORAL | Status: DC | PRN
  Administered 2012-06-13: 650 mg via ORAL
  Filled 2012-06-12: qty 2

## 2012-06-12 MED ORDER — FLUOXETINE HCL 20 MG PO CAPS
40.0000 mg | ORAL_CAPSULE | Freq: Every day | ORAL | Status: DC
  Administered 2012-06-13 (×2): 40 mg via ORAL
  Filled 2012-06-12 (×3): qty 2

## 2012-06-12 MED ORDER — SODIUM CHLORIDE 0.9 % IJ SOLN
3.0000 mL | Freq: Two times a day (BID) | INTRAMUSCULAR | Status: DC
  Administered 2012-06-13 (×2): 3 mL via INTRAVENOUS

## 2012-06-12 MED ORDER — ENOXAPARIN SODIUM 40 MG/0.4ML ~~LOC~~ SOLN
40.0000 mg | SUBCUTANEOUS | Status: DC
  Administered 2012-06-13: 40 mg via SUBCUTANEOUS
  Filled 2012-06-12 (×2): qty 0.4

## 2012-06-12 MED ORDER — CYCLOBENZAPRINE HCL 10 MG PO TABS
10.0000 mg | ORAL_TABLET | Freq: Every day | ORAL | Status: DC
  Administered 2012-06-13 (×2): 10 mg via ORAL
  Filled 2012-06-12 (×2): qty 1

## 2012-06-12 MED ORDER — PANTOPRAZOLE SODIUM 40 MG PO TBEC
40.0000 mg | DELAYED_RELEASE_TABLET | Freq: Every day | ORAL | Status: DC
  Administered 2012-06-13 (×2): 40 mg via ORAL
  Filled 2012-06-12 (×2): qty 1

## 2012-06-12 MED ORDER — ACETAMINOPHEN 650 MG RE SUPP: 650.0000 mg | Freq: Four times a day (QID) | RECTAL | Status: DC | PRN

## 2012-06-12 MED ORDER — NICOTINE 21 MG/24HR TD PT24
21.0000 mg | MEDICATED_PATCH | Freq: Every day | TRANSDERMAL | Status: DC
  Administered 2012-06-13: 21 mg via TRANSDERMAL
  Filled 2012-06-12 (×2): qty 1

## 2012-06-12 MED ORDER — IBUPROFEN 600 MG PO TABS
600.0000 mg | ORAL_TABLET | Freq: Four times a day (QID) | ORAL | Status: DC | PRN
  Filled 2012-06-12: qty 1

## 2012-06-12 MED ORDER — HYDROCHLOROTHIAZIDE 25 MG PO TABS
25.0000 mg | ORAL_TABLET | Freq: Every day | ORAL | Status: DC
  Administered 2012-06-13 (×2): 25 mg via ORAL
  Filled 2012-06-12 (×2): qty 1

## 2012-06-12 NOTE — Telephone Encounter (Signed)
Agree. thanks

## 2012-06-12 NOTE — Progress Notes (Signed)
Subjective:   Patient ID: Dashonna Chagnon female   DOB: 1956/02/27 57 y.o.   MRN: [id number]  HPI: Ms.Briell Claw is a 57 y.o. female with PMH signficant as outlined below who presented the clinic with Nausea/Vomiting/Diarrhea since 4 days. Patient reports that she started to have nausea and vomiting - initially he was greenish in color and then later he was just white but never bloody. She further endorses diarrhea -non-bloody, watery. She didn't know more quantify how many times she had vomiting episodes as well as diarrhea. Furthermore noted severe abdominal pain, sharp in character worse with drinking or eating. There is no specific area. Denies any recent travel, changes in diet, sick contact, antibiotic use. She has been not eating and drinking since 4 days. Not able to take any of her medication.  Patient further noted that she has a feeling that something is in her right ear. If he something is crawling and she tries to take it out with a Q-tip. This has been going on for 3-4 months. Denies any ringing of ears or drainage  Past Medical History  Diagnosis Date  . Hypertension 2007    Admitted for HTN crisis in 10/2008. Was given in 2010 a wrong prescription from the pharmacy and per ED note it was Lisinopril-Hydrocholorthizide which gave her a  hives and feeling of sickness. Patient was started  on this meds on 05/31/2010 by Dr  Bradd Canary without any problem.   . Depression with anxiety 2010    Patient had multiple death in her family over a short period of time. father, brother, uncle and niece who was stabbed 3 years ago. Patient's husband had a car accident last year and  need currently a feeding tube.   . Insomnia   . History of cervical cancer  1985     status post partial hysterectomy , last Pap smear 10 years ago , no further followup  . Tobacco abuse      35 years  . GERD (gastroesophageal reflux disease)   . History of cocaine abuse 10/23/2008     last documented in 2007 when  admitted for hypertensive urgency  . Arthritis   . Depression    Current Outpatient Prescriptions  Medication Sig Dispense Refill  . acetaminophen (TYLENOL) 325 MG tablet Take 1 tablet (325 mg total) by mouth every 6 (six) hours as needed for pain.  120 tablet  0  . cyclobenzaprine (FLEXERIL) 10 MG tablet Take 1 tablet (10 mg total) by mouth daily.  30 tablet  1  . FLUoxetine (PROZAC) 40 MG capsule Take 1 capsule (40 mg total) by mouth daily.  30 capsule  2  . hydrochlorothiazide (HYDRODIURIL) 25 MG tablet Take 1 tablet (25 mg total) by mouth daily.  30 tablet  11  . ibuprofen (ADVIL) 200 MG tablet Take 1 tablet (200 mg total) by mouth every 6 (six) hours as needed for pain.  100 tablet  0  . omeprazole (PRILOSEC) 40 MG capsule Take 1 capsule (40 mg total) by mouth daily.  30 capsule  3  . zolpidem (AMBIEN) 10 MG tablet Take 1 tablet (10 mg total) by mouth at bedtime as needed for sleep.  30 tablet  3   Family History  Problem Relation Age of Onset  . Thyroid disease Mother   . Diabetes Father   . Heart disease Father   . Hyperlipidemia Father   . Hypertension Father   . Diabetes Brother   . Hypertension Brother   .  Hyperlipidemia Brother   . Colon polyps Brother   . Thyroid disease Daughter   . Obesity Daughter   . Mental illness Daughter   . Colon cancer Maternal Uncle    History   Social History  . Marital Status: Single    Spouse Name: N/A    Number of Children: N/A  . Years of Education: N/A   Social History Main Topics  . Smoking status: Current Some Day Smoker -- 0.5 packs/day    Types: Cigarettes  . Smokeless tobacco: Never Used  . Alcohol Use: No  . Drug Use: No  . Sexually Active: Not on file   Other Topics Concern  . Not on file   Social History Narrative  . No narrative on file   Review of Systems: Constitutional: Denies fever but noted chills, diaphoresis, appetite change and fatigue.  Respiratory: Denies SOB, DOE, cough, chest tightness,  and  wheezing.   Cardiovascular: Noted mild chest pain, palpitations episode of vomiting  Gastrointestinal: Noted  nausea, vomiting, abdominal pain, diarrhea,abdominal distention but denies blood in stool   Genitourinary: Denies dysuria, urgency, frequency, hematuria, flank pain and difficulty urinating.  Musculoskeletal: Noted myalgias, .  Neurological: Noted dizziness,  weakness, light-headedness but denies syncope, numbness   Objective:  Physical Exam: Filed Vitals:   06/12/12 1509  BP: 160/93  Pulse: 93  Temp: 98.7 F (37.1 C)  TempSrc: Oral  Resp: 24  Height: 5\' 2"  (1.575 m)  Weight: 173 lb 12.8 oz (78.835 kg)  SpO2: 99%   Constitutional: Vital signs reviewed.  Patient is notable for mild distress alert and oriented x3.  Ear: TM normal bilaterally Mouth: no erythema or exudates, MMM Eyes: PERRL, EOMI, conjunctivae normal, No scleral icterus.  Neck: Supple,  Cardiovascular: Tachycardic RR, S1 normal, S2 normal, no MRG, pulses symmetric and intact bilaterally Pulmonary/Chest: CTAB, no wheezes, rales, or rhonchi Abdominal: Soft. Diffusely tender to palpation without guarding, bowel sounds hyperactive , no masses,  GU: no CVA tenderness Musculoskeletal: No joint deformities, Hematology: no cervical  adenopathy.  Neurological: A&O x3

## 2012-06-12 NOTE — Assessment & Plan Note (Addendum)
Patient had history of abdominal pain as well as nausea vomiting in the past but this is significant change. Differential diagnosis include gastroenteritis, unlikely cholecystitis, diverticulitis, appendicitis .  Since patient is not able to keep any by mouth was found patient will be admitted for observation. Will obtain CBC and CMET and U/A.  Placed IV line in left hand . Started normal saline at 125 cc per hour at 4:15 PM

## 2012-06-12 NOTE — Plan of Care (Signed)
Problem: Phase I Progression Outcomes Goal: Initial discharge plan identified Outcome: Completed/Met Date Met:  06/12/12 To return home with boyfriend

## 2012-06-12 NOTE — Telephone Encounter (Signed)
Call from pt. - c/o diarrhea, sorethroat,abd pain, weakness and unable to keep down food x 4 days. States she has been taking sips of water/juice. Denies fever but cold/clammy at times. States she can come today at anytime. There's an 2:45PM opening w/Dr Loistine Chance today. Call pt back - states she will try to be here before 2:45PM. Dr Illath/nurse informed.

## 2012-06-12 NOTE — Progress Notes (Signed)
Lori [name] [id number] Admitted to [id number]: 06/12/2012 8:09 PM Attending Provider: Rocco Serene, MD    Lori [name] is a 57 y.o. female patient admitted from ED awake, alert  & orientated  X 3,  No Order, VSS - Blood pressure 151/80, pulse 72, temperature 98.7 F (37.1 C), temperature source Oral, resp. rate 22, height 5\' 2"  (1.575 m), weight 78.4 kg (172 lb 13.5 oz), SpO2 98.00%., R/A, no c/o shortness of breath, no c/o chest pain, no distress noted.  placed and pt is currently running:.   IV site WDL:  hand left, condition patent and no redness with a transparent dsg that's clean dry and intact.  Allergies:  No Known Allergies   Past Medical History  Diagnosis Date  . Hypertension 2007    Admitted for HTN crisis in 10/2008. Was given in 2010 a wrong prescription from the pharmacy and per ED note it was Lisinopril-Hydrocholorthizide which gave her a  hives and feeling of sickness. Patient was started  on this meds on 05/31/2010 by Dr  Bradd Canary without any problem.   . Depression with anxiety 2010    Patient had multiple death in her family over a short period of time. father, brother, uncle and niece who was stabbed 3 years ago. Patient's husband had a car accident last year and  need currently a feeding tube.   . Insomnia   . History of cervical cancer  1985     status post partial hysterectomy , last Pap smear 10 years ago , no further followup  . Tobacco abuse      35 years  . GERD (gastroesophageal reflux disease)   . History of cocaine abuse 10/23/2008     last documented in 2007 when admitted for hypertensive urgency  . Arthritis   . Depression   . Headache   . Cancer     History:  obtained from pt..  Pt orientation to unit, room and routine. Information packet given to patient/family and safety video watched.  Admission INP armband ID verified with patient/family, and in place. SR up x 2, fall risk assessment complete with Patient and family verbalizing understanding of risks  associated with falls. Pt verbalizes an understanding of how to use the call bell and to call for help before getting out of bed.  Skin, clean-dry- intact without evidence of bruising, or skin tears.   No evidence of skin break down noted on exam.  Rectum area reddened from frequent diarrhea.    Will cont to monitor and assist as needed.  Joana Reamer, RN 06/12/2012 8:09 PM

## 2012-06-13 ENCOUNTER — Encounter (HOSPITAL_COMMUNITY): Payer: Self-pay | Admitting: Internal Medicine

## 2012-06-13 DIAGNOSIS — A088 Other specified intestinal infections: Secondary | ICD-10-CM

## 2012-06-13 DIAGNOSIS — K5289 Other specified noninfective gastroenteritis and colitis: Secondary | ICD-10-CM

## 2012-06-13 DIAGNOSIS — K219 Gastro-esophageal reflux disease without esophagitis: Secondary | ICD-10-CM

## 2012-06-13 DIAGNOSIS — R109 Unspecified abdominal pain: Secondary | ICD-10-CM

## 2012-06-13 DIAGNOSIS — G4700 Insomnia, unspecified: Secondary | ICD-10-CM

## 2012-06-13 DIAGNOSIS — F329 Major depressive disorder, single episode, unspecified: Secondary | ICD-10-CM

## 2012-06-13 DIAGNOSIS — I10 Essential (primary) hypertension: Secondary | ICD-10-CM

## 2012-06-13 LAB — CLOSTRIDIUM DIFFICILE BY PCR: Toxigenic C. Difficile by PCR: NEGATIVE

## 2012-06-13 LAB — CBC
HCT: 37.4 % (ref 36.0–46.0)
Hemoglobin: 12.7 g/dL (ref 12.0–15.0)
MCH: 33.4 pg (ref 26.0–34.0)
MCHC: 34 g/dL (ref 30.0–36.0)
MCV: 98.4 fL (ref 78.0–100.0)
Platelets: 246 10*3/uL (ref 150–400)
RBC: 3.8 MIL/uL — ABNORMAL LOW (ref 3.87–5.11)
RDW: 12.8 % (ref 11.5–15.5)
WBC: 5.9 10*3/uL (ref 4.0–10.5)

## 2012-06-13 LAB — LIPASE, BLOOD: Lipase: 38 U/L (ref 11–59)

## 2012-06-13 LAB — BASIC METABOLIC PANEL
BUN: 9 mg/dL (ref 6–23)
CO2: 21 mEq/L (ref 19–32)
Calcium: 8.9 mg/dL (ref 8.4–10.5)
Chloride: 106 mEq/L (ref 96–112)
Creatinine, Ser: 0.91 mg/dL (ref 0.50–1.10)
GFR calc Af Amer: 81 mL/min — ABNORMAL LOW (ref >90)
GFR calc non Af Amer: 70 mL/min — ABNORMAL LOW (ref >90)
Glucose, Bld: 93 mg/dL (ref 70–99)
Potassium: 3.6 mEq/L (ref 3.5–5.1)
Sodium: 140 mEq/L (ref 135–145)

## 2012-06-13 MED ORDER — ONDANSETRON 4 MG PO TBDP: 4.0000 mg | ORAL_TABLET | Freq: Three times a day (TID) | ORAL | Status: DC | PRN

## 2012-06-13 NOTE — H&P (Signed)
Internal Medicine Attending Admission Note Date: 06/13/2012  Patient name: Lori [name] Medical record number: [id number] Date of birth: Nov 23, 1955 Age: 57 y.o. Gender: female  I saw and evaluated the patient. I reviewed the resident's note and I agree with the resident's findings and plan as documented in the resident's note.  Chief Complaint(s): Nausea, vomiting, diarrhea x 4 days.  History - key components related to admission:  Lori [name] is a 57 year old woman with a history of hypertension, gastroesophageal reflux disease, depression, and anxiety who presents with nausea, vomiting, and diarrhea x 4 days. She was in her usual state of health on Sunday when she went to church and stopped by a [employer] afterwards for a brunch. She did relatively well thereafter until late Sunday night when she developed abdominal pain and associated nausea, vomiting, and diarrhea. These symptoms persisted over the next 3 days and her oral intake was markedly reduced because of it. With the continuance of the symptoms she called the [facility] Medicine Center and was given an appointment for an evaluation. In the Internal Medicine Center she was found to be the hydrated clinically with dry mucous membranes. As there was a concern that she was unable to keep anything down she was admitted for IV hydration.  Since admission she has continued to have diarrhea but her nausea and vomiting have improved. She was able to eat grits for breakfast this morning without difficulty. She denies any sick contacts and states that all of her close contacts are well, including those eat at [employer]. She does have well water but her husband, who drinks the same water, has not had any difficulties. She has not been on any antibiotics recently. She is without other complaints at this time.  Physical Exam - key components related to admission:  Filed Vitals:   06/12/12 2134 06/13/12 0521 06/13/12 0522 06/13/12 0523  BP: 147/74 97/57 113/74 116/78   Pulse: 65 65 69 78  Temp: 97.9 F (36.6 C) 98 F (36.7 C)    TempSrc: Oral Oral    Resp: 22 20    Height:      Weight:      SpO2: 98% 97%     General: Well-developed, well-nourished, woman sitting comfortably in bed in no acute distress. Mucous membranes are moist. Lungs: Clear to auscultation bilaterally without wheezes, rhonchi, or rales. Heart: Regular rate and rhythm without murmurs, rubs, or gallops. Back: Without CVA tenderness. Abdomen: Soft, nontender, hypoactive bowel sounds without masses. Extremities: Without edema. Skin: Normal turgor.  Lab results:  Basic Metabolic Panel:  Basename 06/13/12 0625 06/12/12 2130 06/12/12 1532  NA 140 -- 137  K 3.6 -- 3.8  CL 106 -- 101  CO2 21 -- 22  GLUCOSE 93 -- 114*  BUN 9 -- 8  CREATININE 0.91 0.78 --  CALCIUM 8.9 -- 10.2  MG -- -- --  PHOS -- -- --   Liver Function Tests:  [facility] 06/12/12 1532  AST 31  ALT 20  ALKPHOS 101  BILITOT 0.4  PROT 8.6*  ALBUMIN 4.6    Basename 06/12/12 2130  LIPASE 38  AMYLASE --   CBC:  Basename 06/13/12 0625 06/12/12 2130  WBC 5.9 7.1  NEUTROABS -- --  HGB 12.7 13.2  HCT 37.4 39.5  MCV 98.4 97.8  PLT 246 259   Urinalysis:  Trace ketones, trace protein  Misc. Labs:  Clostridium difficile by PCR: Negative  Assessment & Plan by Problem:  Lori [name] is a 57 year old woman who presents with  a four-day history of nausea, vomiting, and diarrhea. During this time she's been unable to keep anything down. This has resulted in dehydration. The likely cause of her symptoms is a viral gastroenteritis. We are not sure where she picked this up. Symptomatically she is feeling better with IV hydration and was able to keep her grits down this morning without any vomiting. Her diarrhea persists but should be self-limited given the negative PCR for Clostridium difficile. Although she has a well, a protozoal infection would not explain the nausea and vomiting. With no other family members  ill this is very unlikely to be causing the patient's symptoms.  1) Viral gastroenteritis: Nausea and vomiting are improved and she is able to keep fluids and food down. She's subjectively feeling better after IV hydration. At this point she is stable to go home since she can keep herself well hydrated orally. The diarrhea should resolve within the next 24 hours or so.  2) Disposition: I agree with the housestaff's plan to discharge Lori [name] home today with followup in the Internal Medicine Center. She is instructed to call and make an appointment sooner if her diarrhea should persist.

## 2012-06-13 NOTE — Progress Notes (Signed)
DC home with daughter, verbally understood dc instructions, no questions ask.

## 2012-06-13 NOTE — Discharge Summary (Signed)
Internal Medicine Teaching [facility] Discharge Note  Name: Lori [name] MRN: [id number] DOB: 02/18/1956 57 y.o.  Date of Admission: 06/12/2012  5:33 PM Date of Discharge: 06/13/2012 Attending Physician: Rocco Serene, MD  Discharge Diagnosis: 1. Viral gastroenteritis - improved with IVF, zofran 2. Hypertension 3. GERD 4. Depression  Discharge Medications:   Medication List     As of 06/13/2012  1:10 PM    TAKE these medications         acetaminophen 325 MG tablet   Commonly known as: TYLENOL   Take 1 tablet (325 mg total) by mouth every 6 (six) hours as needed for pain.      cyclobenzaprine 10 MG tablet   Commonly known as: FLEXERIL   Take 1 tablet (10 mg total) by mouth daily.      FLUoxetine 40 MG capsule   Commonly known as: PROZAC   Take 1 capsule (40 mg total) by mouth daily.      hydrochlorothiazide 25 MG tablet   Commonly known as: HYDRODIURIL   Take 1 tablet (25 mg total) by mouth daily.      ibuprofen 200 MG tablet   Commonly known as: ADVIL,MOTRIN   Take 1 tablet (200 mg total) by mouth every 6 (six) hours as needed for pain.      omeprazole 40 MG capsule   Commonly known as: PRILOSEC   Take 1 capsule (40 mg total) by mouth daily.      ondansetron 4 MG disintegrating tablet   Commonly known as: ZOFRAN-ODT   Take 1 tablet (4 mg total) by mouth every 8 (eight) hours as needed for nausea.      zolpidem 10 MG tablet   Commonly known as: AMBIEN   Take 1 tablet (10 mg total) by mouth at bedtime as needed for sleep.        Disposition and follow-up:   [name] was discharged from [facility] in stable and improved condition, able to tolerate food and water, and with improvement in diarrhea.  At the patient's follow-up visit, please address the following: 1. Resolution of viral gastroenteritis.  Follow-up Appointments:  Follow-up Information    Follow up with Almyra Deforest, MD. Schedule an appointment as soon as possible for  a visit on 06/25/2012. (10:00 am)    Contact information:   [address] [place] [address] [phone]          Consultations: None  Procedures Performed:  None  Admission HPI:  [name] is a 57 y.o. female with history of HTN, depression/anxiety, GERD, and polysubstance abuse presenting with 3-4 days of nausea, vomiting and diarrhea.  She said she noticed feeling "off" on Sunday night. Then, Monday morning, she started to have nausea, vomiting and diarrhea. Vomit was at first green color and today is whitish and frothy. No blood in emesis. She describes diarrhea as "[place] and then watery" and without blood. She says she has had accidents on the way to the bathroom due to urgency of diarrhea. Last episode of emesis and diarrhea were this morning a little before noon. She has had sharp, stabbing epigastric pain that is provoked by vomiting. Denies any recent travel, changes in diet, sick contact, antibiotic use. She has been not eating and drinking over the past 4 days. Unable to keep down any of her meds.  She describes throbbing headache since Monday as well. Over the past couple of days, she feels dizzy when she stands up and has to  steady herself on nearby furniture. No syncope.  Denies photophobia, chest pain, shortness of breath, focal weakness/numbness, dysuria/hematuria.   Admission Physical Exam Blood pressure 147/74, pulse 65, temperature 97.9 F (36.6 C), temperature source Oral, resp. rate 22, height 5\' 2"  (1.575 m), weight 172 lb 13.5 oz (78.4 kg), SpO2 98.00%.  Vitals reviewed.  General: resting in bed, cloth on forehead, NAD  HEENT: PERRL, EOMI, no scleral icterus, slightly dry MM of OP  Cardiac: RRR, no rubs, murmurs or gallops  Pulm: clear to auscultation bilaterally, no wheezes, rales, or rhonchi  Abd: Tenderness to deep palpation of epigastrum with no other tender quadrants, no rebound or guarding, no CVA tenderness, normoactive BS  Ext: warm and well  perfused, no pedal edema  Neuro: alert and oriented X3, cranial nerves II-XII grossly intact, strength and sensation to light touch equal in bilateral upper and lower extremities. No nuchal rigidity.  Admission Labs Basic Metabolic Panel:  [facility]  06/12/12 2130  06/12/12 1532   NA  --  137   K  --  3.8   CL  --  101   CO2  --  22   GLUCOSE  --  114*   BUN  --  8   CREATININE  0.78  0.80   CALCIUM  --  10.2   MG  --  --   PHOS  --  --    Liver Function Tests:  [facility]  06/12/12 1532   AST  31   ALT  20   ALKPHOS  101   BILITOT  0.4   PROT  8.6*   ALBUMIN  4.6   CBC:  Basename  06/12/12 2130  06/12/12 1532   WBC  7.1  9.1   NEUTROABS  --  --   HGB  13.2  15.1*   HCT  39.5  42.3   MCV  97.8  97.9   PLT  259  403*     Hospital Course by problem list: 1. Viral gastroenteritis - The patient presented with a 3-day history of nausea, vomiting, and diarrhea, and was admitted due to inability to take PO at home.  The patient was given IV fluids and zofran, and was able to tolerate a diet by day 2 of hospitalization.  The patient was tested for c diff, and found to be negative.  The patient was discharged home with zofran prn, and will follow-up in the Internal Medicine Clinic.  2. Hypertension - the patient has a history of hypertension.  The patient's HCTZ was continued during hospitalization and at discharge.  Time spent on discharge: 45 minutes  Discharge Vitals:  BP 116/78  Pulse 78  Temp 98 F (36.7 C) (Oral)  Resp 20  Ht 5\' 2"  (1.575 m)  Wt 172 lb 13.5 oz (78.4 kg)  BMI 31.61 kg/m2  SpO2 97%  Discharge Labs:  Results for orders placed during the hospital encounter of 06/12/12 (from the past 24 hour(s))  CBC     Status: Normal   Collection Time   06/12/12  9:30 PM      Component Value Range   WBC 7.1  4.0 - 10.5 K/uL   RBC 4.04  3.87 - 5.11 MIL/uL   Hemoglobin 13.2  12.0 - 15.0 g/dL   HCT 84.6  96.2 - 95.2 %   MCV 97.8  78.0 - 100.0 fL   MCH 32.7  26.0 -  34.0 pg   MCHC 33.4  30.0 - 36.0 g/dL  RDW 12.7  11.5 - 15.5 %   Platelets 259  150 - 400 K/uL  CREATININE, SERUM     Status: Normal   Collection Time   06/12/12  9:30 PM      Component Value Range   Creatinine, Ser 0.78  0.50 - 1.10 mg/dL   GFR calc non Af Amer >90  >90 mL/min   GFR calc Af Amer >90  >90 mL/min  LIPASE, BLOOD     Status: Normal   Collection Time   06/12/12  9:30 PM      Component Value Range   Lipase 38  11 - 59 U/L  CLOSTRIDIUM DIFFICILE BY PCR     Status: Normal   Collection Time   06/13/12  5:59 AM      Component Value Range   C difficile by pcr NEGATIVE  NEGATIVE  BASIC METABOLIC PANEL     Status: Abnormal   Collection Time   06/13/12  6:25 AM      Component Value Range   Sodium 140  135 - 145 mEq/L   Potassium 3.6  3.5 - 5.1 mEq/L   Chloride 106  96 - 112 mEq/L   CO2 21  19 - 32 mEq/L   Glucose, Bld 93  70 - 99 mg/dL   BUN 9  6 - 23 mg/dL   Creatinine, Ser 1.61  0.50 - 1.10 mg/dL   Calcium 8.9  8.4 - 09.6 mg/dL   GFR calc non Af Amer 70 (*) >90 mL/min   GFR calc Af Amer 81 (*) >90 mL/min  CBC     Status: Abnormal   Collection Time   06/13/12  6:25 AM      Component Value Range   WBC 5.9  4.0 - 10.5 K/uL   RBC 3.80 (*) 3.87 - 5.11 MIL/uL   Hemoglobin 12.7  12.0 - 15.0 g/dL   HCT 04.5  40.9 - 81.1 %   MCV 98.4  78.0 - 100.0 fL   MCH 33.4  26.0 - 34.0 pg   MCHC 34.0  30.0 - 36.0 g/dL   RDW 91.4  78.2 - 95.6 %   Platelets 246  150 - 400 K/uL    Signed: Janalyn Harder 06/13/2012, 1:10 PM

## 2012-06-13 NOTE — Care Management Note (Signed)
    Page 1 of 1   06/13/2012     3:34:02 PM   CARE MANAGEMENT NOTE 06/13/2012  Patient:  Lori [name], Lori [name]   Account Number:  [id number]  Date Initiated:  06/13/2012  Documentation initiated by:  Letha Cape  Subjective/Objective Assessment:   dx acute gastroenteritis  admit as observation- lives with spouse.     Action/Plan:   Anticipated DC Date:  06/13/2012   Anticipated DC Plan:  HOME/SELF CARE      DC Planning Services  CM consult      Choice offered to / List presented to:             Status of service:  Completed, signed off Medicare Important Message given?   (If response is "NO", the following Medicare IM given date fields will be blank) Date Medicare IM given:   Date Additional Medicare IM given:    Discharge Disposition:  HOME/SELF CARE  Per UR Regulation:  Reviewed for med. necessity/level of care/duration of stay  If discussed at Long Length of Stay Meetings, dates discussed:    Comments:  06/13/12 15:33 Letha Cape RN, BSN [phone] patient lives with spouse, pta independent.   No needs anticipated.

## 2012-06-13 NOTE — H&P (Signed)
Hospital Admission Note Date: 06/13/2012  Patient name: Lori [name] Medical record number: [id number] Date of birth: 03-08-1956 Age: 57 y.o. Gender: female PCP: Almyra Deforest, MD  Medical Service: [facility]MTS-[facility]  Attending physician:     1st Contact: Dr. Sherrine Maples   [phone] 2nd Contact: Dr. Manson Passey   [phone] After 5 pm or weekends: 1st Contact:  Intern on call   Pager: [phone] 2nd Contact:  Resident on call  Pager: [phone]  Chief Complaint: N/V/D  History of Present Illness: [name] is a 57 y.o. female with history of HTN, depression/anxiety, GERD, and polysubstance abuse presenting with 3-4 days of nausea, vomiting and diarrhea. She said she noticed feeling "off" on Sunday night. Then, Monday morning, she started to have nausea, vomiting and diarrhea. Vomit was at first green color and today is whitish and frothy. No blood in emesis.  She describes diarrhea as "brown and then watery" and without blood. She says she has had accidents on the way to the bathroom due to urgency of diarrhea. Last episode of emesis and diarrhea were this morning a little before noon.  She has had sharp, stabbing epigastric pain that is provoked by vomiting.  Denies any recent travel, changes in diet, sick contact, antibiotic use. She has been not eating and drinking over the past 4 days. Unable to keep down any of her meds.  She describes throbbing headache since Monday as well. Over the past couple of days, she feels dizzy when she stands up and has to steady herself on nearby furniture. No syncope.  Denies photophobia, chest pain, shortness of breath, focal weakness/numbness, dysuria/hematuria.    Meds: Current Outpatient Prescriptions on File Prior to Encounter  Medication Sig Dispense Refill  . acetaminophen (TYLENOL) 325 MG tablet Take 1 tablet (325 mg total) by mouth every 6 (six) hours as needed for pain.  120 tablet  0  . cyclobenzaprine (FLEXERIL) 10 MG tablet Take 1 tablet (10 mg total) by  mouth daily.  30 tablet  1  . FLUoxetine (PROZAC) 40 MG capsule Take 1 capsule (40 mg total) by mouth daily.  30 capsule  2  . hydrochlorothiazide (HYDRODIURIL) 25 MG tablet Take 1 tablet (25 mg total) by mouth daily.  30 tablet  11  . ibuprofen (ADVIL) 200 MG tablet Take 1 tablet (200 mg total) by mouth every 6 (six) hours as needed for pain.  100 tablet  0  . omeprazole (PRILOSEC) 40 MG capsule Take 1 capsule (40 mg total) by mouth daily.  30 capsule  3  . zolpidem (AMBIEN) 10 MG tablet Take 1 tablet (10 mg total) by mouth at bedtime as needed for sleep.  30 tablet  3    Allergies: Allergies as of 06/12/2012  . (No Known Allergies)   Past Medical History  Diagnosis Date  . Hypertension 2007    Admitted for HTN crisis in 10/2008. Was given in 2010 a wrong prescription from the pharmacy and per ED note it was Lisinopril-Hydrocholorthizide which gave her a  hives and feeling of sickness. Patient was started  on this meds on 05/31/2010 by Dr  Bradd Canary without any problem.   . Depression with anxiety 2010    Patient had multiple death in her family over a short period of time. father, brother, uncle and niece who was stabbed 3 years ago. Patient's husband had a car accident last year and  need currently a feeding tube.   . Insomnia   . History of cervical cancer  1985  status post partial hysterectomy , last Pap smear 10 years ago , no further followup  . Tobacco abuse      35 years  . GERD (gastroesophageal reflux disease)   . History of cocaine abuse 10/23/2008     last documented in 2007 when admitted for hypertensive urgency  . Arthritis   . Depression   . Headache   . Cancer    Past Surgical History  Procedure Date  . Abdominal hysterectomy   . Cesarean section 1985  . Skin graft    Family History  Problem Relation Age of Onset  . Thyroid disease Mother   . Diabetes Father   . Heart disease Father   . Hyperlipidemia Father   . Hypertension Father   . Diabetes Brother    . Hypertension Brother   . Hyperlipidemia Brother   . Colon polyps Brother   . Thyroid disease Daughter   . Obesity Daughter   . Mental illness Daughter   . Colon cancer Maternal Uncle    History   Social History  . Marital Status: Single    Spouse Name: N/A    Number of Children: N/A  . Years of Education: N/A   Occupational History  . Not on file.   Social History Main Topics  . Smoking status: Current Some Day Smoker -- 0.5 packs/day for 43 years    Types: Cigarettes  . Smokeless tobacco: Never Used  . Alcohol Use: No  . Drug Use: No  . Sexually Active: Not on file   Other Topics Concern  . Not on file   Social History Narrative   Patient had multiple death in her family over a short period of time. father, brother, uncle and niece who was stabbed 3 years ago. Patient's husband had a car accident last year and  need currently a feeding tube.     Review of Systems: 10 pt ROS performed, pertinent positives and negatives noted in HPI  Physical Exam: Blood pressure 147/74, pulse 65, temperature 97.9 F (36.6 C), temperature source Oral, resp. rate 22, height 5\' 2"  (1.575 m), weight 172 lb 13.5 oz (78.4 kg), SpO2 98.00%. Vitals reviewed. General: resting in bed, cloth on forehead, NAD HEENT: PERRL, EOMI, no scleral icterus, slightly dry MM of OP Cardiac: RRR, no rubs, murmurs or gallops Pulm: clear to auscultation bilaterally, no wheezes, rales, or rhonchi Abd: Tenderness to deep palpation of epigastrum with no other tender quadrants, no rebound or guarding, no CVA tenderness, normoactive BS Ext: warm and well perfused, no pedal edema Neuro: alert and oriented X3, cranial nerves II-XII grossly intact, strength and sensation to light touch equal in bilateral upper and lower extremities. No nuchal rigidity.   Lab results: Basic Metabolic Panel:  [facility] 06/12/12 2130 06/12/12 1532  NA -- 137  K -- 3.8  CL -- 101  CO2 -- 22  GLUCOSE -- 114*  BUN -- 8   CREATININE 0.78 0.80  CALCIUM -- 10.2  MG -- --  PHOS -- --   Liver Function Tests:  [facility] 06/12/12 1532  AST 31  ALT 20  ALKPHOS 101  BILITOT 0.4  PROT 8.6*  ALBUMIN 4.6  CBC:  Basename 06/12/12 2130 06/12/12 1532  WBC 7.1 9.1  NEUTROABS -- --  HGB 13.2 15.1*  HCT 39.5 42.3  MCV 97.8 97.9  PLT 259 403*  Urinalysis:  Basename 06/12/12 1559  COLORURINE YELLOW  LABSPEC 1.014  PHURINE 6.0  GLUCOSEU NEG  HGBUR NEGATIVE  BILIRUBINUR NEGATIVE  KETONESUR TRACE*  PROTEINUR TRACE  UROBILINOGEN 0.2  NITRITE NEG  LEUKOCYTESUR NEGATIVE   Assessment & Plan by Problem: Ms.Carrin Obey is a 57 y.o. female with history of HTN, depression/anxiety, GERD, and polysubstance abuse presenting with 3-4 days of nausea, vomiting and diarrhea.  1) Acute gastroenteritis Patient with 3 days of nausea, vomiting, and diarrhea. No recent travel or sick contacts. No recent abx use. Appears sightly dry on exam, abdominal exam fairly benign with mild epigastric tenderness. Laboratory work up reveals normal WBC count, with normal LFTs and no electrolyte derangement. On our evaluation, 10 hours had lapsed since last emesis or BM, and she was requesting food.  Suspect that she has a viral gastroenteritis which is already starting to improve. She was a little hemoconcentrated on admission. Also w modest AG of 14, trace ketones in urine. Suspect mild starvation ketoacidosis (pt not diabetic, nl blood sugar). Will provide IVF overnight. Will check C. Diff, lipase for thoroughness; although low suspicion for both.  - NS 150cc/hr - clear liquid diet, will ADAT - Lipase/c diff pending  2) GERD Stable, continue pantoprazole.   3) Chronic arthritic pain/muscle tightness Stable, continue flexeril  4) Depression Stable, continue fluoxetine.  5) HTN BP 160/93 on admission, pt missed home BP meds. Most recent 147/74 - Continue home HCTZ  6) Tobacco abuse Patient request nicotene patch  DVT:  Lovenox  Dispo: Disposition is deferred at this time, awaiting improvement of current medical problems. Anticipated discharge in approximately 1 day.   The patient does have a current PCP Almyra Deforest, MD), therefore will be requiring OPC follow-up after discharge.   The patient does not have transportation limitations that hinder transportation to clinic appointments.  Signed: Bronson Curb 06/13/2012, 12:17 AM

## 2012-06-14 ENCOUNTER — Other Ambulatory Visit: Payer: Self-pay | Admitting: Internal Medicine

## 2012-06-14 ENCOUNTER — Other Ambulatory Visit (HOSPITAL_COMMUNITY): Payer: Self-pay | Admitting: Internal Medicine

## 2012-06-14 DIAGNOSIS — F32A Depression, unspecified: Secondary | ICD-10-CM

## 2012-06-14 DIAGNOSIS — I10 Essential (primary) hypertension: Secondary | ICD-10-CM

## 2012-06-14 DIAGNOSIS — F329 Major depressive disorder, single episode, unspecified: Secondary | ICD-10-CM

## 2012-06-14 DIAGNOSIS — R110 Nausea: Secondary | ICD-10-CM

## 2012-06-25 ENCOUNTER — Other Ambulatory Visit: Payer: Self-pay | Admitting: *Deleted

## 2012-06-25 ENCOUNTER — Ambulatory Visit: Payer: Self-pay | Admitting: Internal Medicine

## 2012-06-25 DIAGNOSIS — G4700 Insomnia, unspecified: Secondary | ICD-10-CM

## 2012-06-29 ENCOUNTER — Encounter (HOSPITAL_COMMUNITY): Payer: Self-pay | Admitting: Emergency Medicine

## 2012-06-29 ENCOUNTER — Emergency Department (HOSPITAL_COMMUNITY)
Admission: EM | Admit: 2012-06-29 | Discharge: 2012-06-29 | Disposition: A | Discharge status: Home / Self Care | Payer: Self-pay | Attending: Emergency Medicine | Admitting: Emergency Medicine

## 2012-06-29 DIAGNOSIS — I10 Essential (primary) hypertension: Secondary | ICD-10-CM

## 2012-06-29 DIAGNOSIS — J029 Acute pharyngitis, unspecified: Secondary | ICD-10-CM | POA: Insufficient documentation

## 2012-06-29 DIAGNOSIS — G4700 Insomnia, unspecified: Secondary | ICD-10-CM | POA: Insufficient documentation

## 2012-06-29 DIAGNOSIS — F17200 Nicotine dependence, unspecified, uncomplicated: Secondary | ICD-10-CM | POA: Insufficient documentation

## 2012-06-29 DIAGNOSIS — F329 Major depressive disorder, single episode, unspecified: Secondary | ICD-10-CM | POA: Insufficient documentation

## 2012-06-29 DIAGNOSIS — H9190 Unspecified hearing loss, unspecified ear: Secondary | ICD-10-CM | POA: Insufficient documentation

## 2012-06-29 DIAGNOSIS — G500 Trigeminal neuralgia: Secondary | ICD-10-CM

## 2012-06-29 DIAGNOSIS — Z8669 Personal history of other diseases of the nervous system and sense organs: Secondary | ICD-10-CM | POA: Insufficient documentation

## 2012-06-29 DIAGNOSIS — F3289 Other specified depressive episodes: Secondary | ICD-10-CM | POA: Insufficient documentation

## 2012-06-29 DIAGNOSIS — Z8739 Personal history of other diseases of the musculoskeletal system and connective tissue: Secondary | ICD-10-CM | POA: Insufficient documentation

## 2012-06-29 DIAGNOSIS — F341 Dysthymic disorder: Secondary | ICD-10-CM | POA: Insufficient documentation

## 2012-06-29 DIAGNOSIS — Z79899 Other long term (current) drug therapy: Secondary | ICD-10-CM | POA: Insufficient documentation

## 2012-06-29 DIAGNOSIS — K219 Gastro-esophageal reflux disease without esophagitis: Secondary | ICD-10-CM | POA: Insufficient documentation

## 2012-06-29 DIAGNOSIS — Z8541 Personal history of malignant neoplasm of cervix uteri: Secondary | ICD-10-CM | POA: Insufficient documentation

## 2012-06-29 MED ORDER — HYDROCODONE-ACETAMINOPHEN 5-325 MG PO TABS
1.0000 | ORAL_TABLET | Freq: Once | ORAL | Status: AC
  Administered 2012-06-29: 1 via ORAL
  Filled 2012-06-29: qty 1

## 2012-06-29 MED ORDER — LORAZEPAM 1 MG PO TABS: 1.0000 mg | ORAL_TABLET | Freq: Three times a day (TID) | ORAL | Status: DC | PRN

## 2012-06-29 MED ORDER — LORAZEPAM 1 MG PO TABS
1.0000 mg | ORAL_TABLET | Freq: Once | ORAL | Status: AC
  Administered 2012-06-29: 1 mg via ORAL
  Filled 2012-06-29 (×2): qty 2

## 2012-06-29 MED ORDER — HYDROCODONE-ACETAMINOPHEN 5-325 MG PO TABS: 2.0000 | ORAL_TABLET | ORAL | Status: DC | PRN

## 2012-06-29 MED ORDER — ANTIPYRINE-BENZOCAINE 5.4-1.4 % OT SOLN
3.0000 [drp] | Freq: Once | OTIC | Status: AC
  Administered 2012-06-29: 3 [drp] via OTIC
  Filled 2012-06-29: qty 10

## 2012-06-29 NOTE — ED Provider Notes (Signed)
History     CSN: [id number]  Arrival date & time 06/29/12  4098   First MD Initiated Contact with Patient 06/29/12 0901      Chief Complaint  Patient presents with  . Otalgia    (Consider location/radiation/quality/duration/timing/severity/associated sxs/prior treatment) Patient is a 57 y.o. female presenting with ear pain. The history is provided by the patient.  Otalgia Location:  Right Behind ear:  No abnormality Quality:  Sharp and shooting Severity:  Severe Duration:  3 days Timing:  Intermittent (happens every few minutes) Progression:  Worsening Context: not foreign body in ear and no water in ear   Relieved by: rubbing her jaw. Associated symptoms: hearing loss and sore throat   Associated symptoms: no congestion, no cough, no ear discharge, no fever and no rhinorrhea   Associated symptoms comment:  Nausea when the pain gets really bad   Past Medical History  Diagnosis Date  . Hypertension 2007    Admitted for HTN crisis in 10/2008. Was given in 2010 a wrong prescription from the pharmacy and per ED note it was Lisinopril-Hydrocholorthizide which gave her a  hives and feeling of sickness. Patient was started  on this meds on 05/31/2010 by Dr  Bradd Canary without any problem.   . Depression with anxiety 2010    Patient had multiple death in her family over a short period of time. father, brother, uncle and niece who was stabbed 3 years ago. Patient's husband had a car accident last year and  need currently a feeding tube.   . Insomnia   . History of cervical cancer  1985     status post partial hysterectomy , last Pap smear 10 years ago , no further followup  . Tobacco abuse      35 years  . GERD (gastroesophageal reflux disease)   . History of cocaine abuse 10/23/2008     last documented in 2007 when admitted for hypertensive urgency  . Arthritis   . Depression   . Headache   . Cancer     Past Surgical History  Procedure Laterality Date  . Abdominal  hysterectomy    . Cesarean section  1985  . Skin graft      Family History  Problem Relation Age of Onset  . Thyroid disease Mother   . Diabetes Father   . Heart disease Father   . Hyperlipidemia Father   . Hypertension Father   . Diabetes Brother   . Hypertension Brother   . Hyperlipidemia Brother   . Colon polyps Brother   . Thyroid disease Daughter   . Obesity Daughter   . Mental illness Daughter   . Colon cancer Maternal Uncle     History  Substance Use Topics  . Smoking status: Current Some Day Smoker -- 0.50 packs/day for 43 years    Types: Cigarettes  . Smokeless tobacco: Never Used  . Alcohol Use: No    OB History   Grav Para Term Preterm Abortions TAB SAB Ect Mult Living                  Review of Systems  Constitutional: Negative for fever and chills.  HENT: Positive for hearing loss, ear pain and sore throat. Negative for congestion, rhinorrhea and ear discharge.   Eyes: Negative.   Respiratory: Negative for cough and shortness of breath.   Cardiovascular: Negative for chest pain.    Allergies  Review of patient's allergies indicates no known allergies.  Home Medications  Current Outpatient Rx  Name  Route  Sig  Dispense  Refill  . acetaminophen (TYLENOL) 325 MG tablet   Oral   Take 1 tablet (325 mg total) by mouth every 6 (six) hours as needed for pain.   120 tablet   0   . cyclobenzaprine (FLEXERIL) 10 MG tablet   Oral   Take 1 tablet (10 mg total) by mouth daily.   30 tablet   1   . FLUoxetine (PROZAC) 40 MG capsule   Oral   Take 40 mg by mouth daily.         Marland Kitchen ibuprofen (ADVIL,MOTRIN) 200 MG tablet   Oral   Take 800 mg by mouth every 6 (six) hours as needed for pain.         Marland Kitchen lisinopril (PRINIVIL,ZESTRIL) 20 MG tablet   Oral   Take 20 mg by mouth daily.         Marland Kitchen omeprazole (PRILOSEC) 40 MG capsule   Oral   Take 1 capsule (40 mg total) by mouth daily.   30 capsule   3   . zolpidem (AMBIEN) 10 MG tablet   Oral    Take 1 tablet (10 mg total) by mouth at bedtime as needed for sleep.   30 tablet   3     BP 173/93  Pulse 83  Temp(Src) 98 F (36.7 C) (Oral)  Resp 18  SpO2 98%  Physical Exam  Constitutional: She appears well-developed.  HENT:  Head: Normocephalic and atraumatic.  Right Ear: There is swelling and tenderness. No mastoid tenderness. Tympanic membrane is not injected.  Left Ear: External ear and ear canal normal. No swelling or tenderness. No mastoid tenderness. Tympanic membrane is not injected.  Nose: No mucosal edema, rhinorrhea or sinus tenderness.  Mouth/Throat: Uvula is midline. No oropharyngeal exudate.  Cardiovascular: Normal rate, regular rhythm and normal heart sounds.  Exam reveals no gallop and no friction rub.   No murmur heard. Pulmonary/Chest: Effort normal and breath sounds normal. No respiratory distress. She has no wheezes. She has no rales.  Lymphadenopathy:    She has no cervical adenopathy.  Neurological: She is alert.    ED Course  Procedures (including critical care time)  Labs Reviewed - No data to display No results found.   No diagnosis found.    MDM  Auralgan given to r/o otitis externa - no relief. Sharp shooting intermittent pain consistent with trigeminal neuralgia. Ativan and norco given and some relief noted. F/u with neuro on Monday to schedule an appointment.  Patient has hx of HTN with current BP of 173/93. Has not taken BP medicine in 2 days because of the sharp shooting pain near her ear.       Mora Bellman, PA-C 06/29/12 1145

## 2012-06-29 NOTE — ED Notes (Signed)
Pts ride is at the bedside and endorses that he will be driving pt home

## 2012-06-29 NOTE — ED Provider Notes (Signed)
Co-evaluation with Junious Silk:  The patient reiterates complaint as documented by Junious Silk. She elaborates that there has been a sharp tingling sensation inside her ear for the past several months, "feels like bugs in my ear biting me". Recently, symptoms worsened to include sharp, intense, stabbing pain along the jaw and into the temple. No swelling or rash at any time. No fever, ear drainage, significant URI symptoms. No history of the same symptoms in the past.   PE:  Mild pain with external ear movement. No redness, swelling or rash to ear or scalp. External canal is swollen without erythema or impaction. Visualized TM is clear without effusion, redness or dullness. No pre- or post-auricular adenopathy. Oropharynx is benign. No palpable facial tenderness. During exam, she has sudden, intermittent intense pain extending from the superior portion of ear into right cheek along jaw.  DDx: Otitis externa - Patient has no risk factors, has no drainage, there is no redness in canal, making this less likely. Also, pain no better with Auralgen. Trigeminal Neuralgia - Follows pattern of intensity and location to support Trigeminal neuralgia. Symptoms improved with Ativan and Norco. TMJ -  Pain is not limited to joint and there are no symptoms with chewing. Doubt TMJ.  Diagnosis:  Trigeminal Neuralgia  Plan:  Lori [name], Neuro referral.   Arnoldo Hooker, PA-C 06/29/12 1216

## 2012-06-29 NOTE — ED Notes (Signed)
Pt ambulatory leaving ED; Pt has been instructed not to drive; pt has friend that endorses that he will be the one driving pt home; pt does not show signs of acute distress upon d/c. Pt alert and mentating appropriately upon d/c. Pt given d/c teaching and prescriptions. Pt verbalizes understanding of d/c teaching and has no further questions upon d/c.

## 2012-06-30 ENCOUNTER — Encounter (HOSPITAL_COMMUNITY): Payer: Self-pay

## 2012-06-30 ENCOUNTER — Emergency Department (HOSPITAL_COMMUNITY): Payer: Self-pay

## 2012-06-30 ENCOUNTER — Emergency Department (HOSPITAL_COMMUNITY)
Admission: EM | Admit: 2012-06-30 | Discharge: 2012-06-30 | Disposition: A | Discharge status: Home / Self Care | Payer: Self-pay | Attending: Emergency Medicine | Admitting: Emergency Medicine

## 2012-06-30 DIAGNOSIS — Z79899 Other long term (current) drug therapy: Secondary | ICD-10-CM | POA: Insufficient documentation

## 2012-06-30 DIAGNOSIS — K219 Gastro-esophageal reflux disease without esophagitis: Secondary | ICD-10-CM | POA: Insufficient documentation

## 2012-06-30 DIAGNOSIS — G4700 Insomnia, unspecified: Secondary | ICD-10-CM | POA: Insufficient documentation

## 2012-06-30 DIAGNOSIS — Z8679 Personal history of other diseases of the circulatory system: Secondary | ICD-10-CM | POA: Insufficient documentation

## 2012-06-30 DIAGNOSIS — H538 Other visual disturbances: Secondary | ICD-10-CM | POA: Insufficient documentation

## 2012-06-30 DIAGNOSIS — I10 Essential (primary) hypertension: Secondary | ICD-10-CM | POA: Insufficient documentation

## 2012-06-30 DIAGNOSIS — H6090 Unspecified otitis externa, unspecified ear: Secondary | ICD-10-CM

## 2012-06-30 DIAGNOSIS — R51 Headache: Secondary | ICD-10-CM | POA: Insufficient documentation

## 2012-06-30 DIAGNOSIS — Z8541 Personal history of malignant neoplasm of cervix uteri: Secondary | ICD-10-CM | POA: Insufficient documentation

## 2012-06-30 DIAGNOSIS — F17200 Nicotine dependence, unspecified, uncomplicated: Secondary | ICD-10-CM | POA: Insufficient documentation

## 2012-06-30 DIAGNOSIS — H60399 Other infective otitis externa, unspecified ear: Secondary | ICD-10-CM | POA: Insufficient documentation

## 2012-06-30 DIAGNOSIS — M129 Arthropathy, unspecified: Secondary | ICD-10-CM | POA: Insufficient documentation

## 2012-06-30 DIAGNOSIS — H9209 Otalgia, unspecified ear: Secondary | ICD-10-CM

## 2012-06-30 DIAGNOSIS — F1410 Cocaine abuse, uncomplicated: Secondary | ICD-10-CM | POA: Insufficient documentation

## 2012-06-30 MED ORDER — AMOXICILLIN-POT CLAVULANATE 875-125 MG PO TABS: 1.0000 | ORAL_TABLET | Freq: Two times a day (BID) | ORAL | Status: DC

## 2012-06-30 MED ORDER — ONDANSETRON 4 MG PO TBDP
8.0000 mg | ORAL_TABLET | Freq: Once | ORAL | Status: AC
  Administered 2012-06-30: 8 mg via ORAL
  Filled 2012-06-30: qty 2

## 2012-06-30 MED ORDER — NEOMYCIN-POLYMYXIN-HC 3.5-10000-1 OT SOLN
4.0000 [drp] | OTIC | Status: AC
  Administered 2012-06-30: 4 [drp] via OTIC
  Filled 2012-06-30: qty 10

## 2012-06-30 MED ORDER — HYDROMORPHONE HCL PF 2 MG/ML IJ SOLN
2.0000 mg | Freq: Once | INTRAMUSCULAR | Status: AC
  Administered 2012-06-30: 2 mg via INTRAMUSCULAR
  Filled 2012-06-30: qty 1

## 2012-06-30 NOTE — ED Provider Notes (Signed)
Medical screening examination/treatment/procedure(s) were performed by non-physician practitioner and as supervising physician I was immediately available for consultation/collaboration.   Yao Hyppolite L Cynthis Purington, MD 06/30/12 1806 

## 2012-06-30 NOTE — ED Notes (Signed)
Pt seen here yesterday and diagnosed with trigeminal neuralgia and d/c'd with prescriptions.  Pt returns today with same complaint and states meds not working.

## 2012-06-30 NOTE — ED Provider Notes (Signed)
History     CSN: [id number]  Arrival date & time 06/30/12  9562   First MD Initiated Contact with Patient 06/30/12 0719      Chief Complaint  Patient presents with  . Facial Pain    (Consider location/radiation/quality/duration/timing/severity/associated sxs/prior treatment) HPI Comments: Patient presents for a stabbing pain on the right side of her face radiating from her right ear along her right jaw line. Pain is constant and worsening over the last three months and began as a sensation that there was something in her ear. Pain aggravated by palpation. Patient seen yesterday in ED and given ativan, norco and neurology follow up for possible trigeminal neuralgia; the pain medicines have  not provided any relief. Patient states she has associated hearing loss in her right ear and blurry vision in her right eye. Denies discharge from her ears or eyes, difficulty swallowing, and LOC.  Patient is a 57 y.o. female presenting with ear pain. The history is provided by the patient. No language interpreter was used.  Otalgia Location:  Right Behind ear:  No abnormality Quality:  Sharp Severity:  Moderate Onset quality:  Gradual Duration:  3 months Timing:  Constant Progression:  Worsening Chronicity:  New Context: not direct blow and not foreign body in ear   Relieved by: mild relief with Vicodin. Worsened by:  Palpation Associated symptoms: hearing loss   Associated symptoms: no congestion, no cough, no diarrhea, no ear discharge, no fever, no neck pain, no rhinorrhea, no tinnitus and no vomiting     Past Medical History  Diagnosis Date  . Hypertension 2007    Admitted for HTN crisis in 10/2008. Was given in 2010 a wrong prescription from the pharmacy and per ED note it was Lisinopril-Hydrocholorthizide which gave her a  hives and feeling of sickness. Patient was started  on this meds on 05/31/2010 by Dr  Bradd Canary without any problem.   . Depression with anxiety 2010    Patient  had multiple death in her family over a short period of time. father, brother, uncle and niece who was stabbed 3 years ago. Patient's husband had a car accident last year and  need currently a feeding tube.   . Insomnia   . History of cervical cancer  1985     status post partial hysterectomy , last Pap smear 10 years ago , no further followup  . Tobacco abuse      35 years  . GERD (gastroesophageal reflux disease)   . History of cocaine abuse 10/23/2008     last documented in 2007 when admitted for hypertensive urgency  . Arthritis   . Depression   . Headache   . Cancer     Past Surgical History  Procedure Laterality Date  . Abdominal hysterectomy    . Cesarean section  1985  . Skin graft      Family History  Problem Relation Age of Onset  . Thyroid disease Mother   . Diabetes Father   . Heart disease Father   . Hyperlipidemia Father   . Hypertension Father   . Diabetes Brother   . Hypertension Brother   . Hyperlipidemia Brother   . Colon polyps Brother   . Thyroid disease Daughter   . Obesity Daughter   . Mental illness Daughter   . Colon cancer Maternal Uncle     History  Substance Use Topics  . Smoking status: Current Some Day Smoker -- 0.50 packs/day for 43 years    Types:  Cigarettes  . Smokeless tobacco: Never Used  . Alcohol Use: No    OB History   Grav Para Term Preterm Abortions TAB SAB Ect Mult Living                  Review of Systems  Constitutional: Negative for fever and chills.  HENT: Positive for hearing loss and ear pain. Negative for congestion, rhinorrhea, drooling, trouble swallowing, neck pain, voice change, tinnitus and ear discharge.   Eyes: Positive for visual disturbance. Negative for photophobia, pain and discharge.  Respiratory: Negative for cough and shortness of breath.   Cardiovascular: Negative for chest pain.  Gastrointestinal: Negative for nausea, vomiting and diarrhea.  Skin: Negative for color change and wound.   Neurological: Negative for syncope and weakness.  All other systems reviewed and are negative.    Allergies  Review of patient's allergies indicates no known allergies.  Home Medications   Current Outpatient Rx  Name  Route  Sig  Dispense  Refill  . FLUoxetine (PROZAC) 40 MG capsule   Oral   Take 40 mg by mouth daily.         Marland Kitchen HYDROcodone-acetaminophen (NORCO/VICODIN) 5-325 MG per tablet   Oral   Take 2 tablets by mouth every 4 (four) hours as needed for pain.   15 tablet   0   . ibuprofen (ADVIL,MOTRIN) 200 MG tablet   Oral   Take 800 mg by mouth every 6 (six) hours as needed for pain.         Marland Kitchen lisinopril (PRINIVIL,ZESTRIL) 20 MG tablet   Oral   Take 20 mg by mouth daily.         Marland Kitchen LORazepam (ATIVAN) 1 MG tablet   Oral   Take 1 tablet (1 mg total) by mouth 3 (three) times daily as needed (pain).   15 tablet   0   . omeprazole (PRILOSEC) 40 MG capsule   Oral   Take 1 capsule (40 mg total) by mouth daily.   30 capsule   3   . zolpidem (AMBIEN) 10 MG tablet   Oral   Take 1 tablet (10 mg total) by mouth at bedtime as needed for sleep.   30 tablet   3   . amoxicillin-clavulanate (AUGMENTIN) 875-125 MG per tablet   Oral   Take 1 tablet by mouth every 12 (twelve) hours.   14 tablet   0     BP 116/70  Pulse 100  Temp(Src) 98.8 F (37.1 C) (Oral)  Resp 16  SpO2 94%  Physical Exam  Nursing note and vitals reviewed. Constitutional: She is oriented to person, place, and time. She appears well-nourished.  Crying secondary to pain  HENT:  Head: Normocephalic and atraumatic. Head is without abrasion, without right periorbital erythema and without left periorbital erythema.  Right Ear: No lacerations. There is swelling and tenderness. No foreign bodies. There is mastoid tenderness. Decreased hearing is noted.  Left Ear: Hearing and external ear normal. No drainage, swelling or tenderness. No decreased hearing is noted.  Nose: Nose normal.   Mouth/Throat: Uvula is midline and oropharynx is clear and moist. No oral lesions. No dental abscesses, edematous or lacerations. No oropharyngeal exudate.  Unable to visualize inner right ear 2/2 swelling of the canal. Patient exquisitely tender on palpation of her R tragus and mastoid process and pulling of the auricle. Left TM unable to be clearly visualized 2/2 cerumen. Patient has no complaints of pain or decreased hearing in this ear.  Tenderness on palpation of the right side of the patient's mandible.  Eyes: Conjunctivae and EOM are normal. Pupils are equal, round, and reactive to light. Right eye exhibits no discharge. Left eye exhibits no discharge. No scleral icterus.  Neck: Normal range of motion. Neck supple.  Cardiovascular: Normal rate, regular rhythm and normal heart sounds.   Pulmonary/Chest: Effort normal and breath sounds normal. No respiratory distress. She has no wheezes. She has no rales.  Musculoskeletal: Normal range of motion. She exhibits no edema.  Lymphadenopathy:    She has no cervical adenopathy.  Neurological: She is alert and oriented to person, place, and time. She has normal strength. No sensory deficit. Coordination and gait normal.  Patient's jaw opens normal and symmetric. Possible decreased muscle strength on right side of jaw with clenching, but unable to definitively evaluate. Gross hearing decreased on R side; unable to hear finger rubbing. Can hear finger rubbing to 2+ feet on left side. Sensation intact and PERRL. No other abnormalities appreciated on CN exam.  Skin: Skin is warm and dry. No rash noted. She is not diaphoretic. No erythema.  Psychiatric: She has a normal mood and affect. Her behavior is normal.    ED Course  Procedures (including critical care time)  Labs Reviewed - No data to display Ct Head Wo Contrast  06/30/2012  *RADIOLOGY REPORT*  Clinical Data:  Right-sided facial and year pain.  CT HEAD AND TEMPORAL BONES WITHOUT CONTRAST   Technique:  Contiguous axial images were obtained from the base of the skull through the vertex without contrast. Multidetector CT imaging of the temporal bones was performed using the standard protocol without intravenous contrast.  Comparison:  08/25/2005  CT HEAD  Findings: There is no evidence of acute infarction.  There is an old infarction in the left frontal cortical and subcortical white matter region.  No sign of mass lesion, hemorrhage, hydrocephalus or extra-axial collection.  The calvarium is unremarkable. Visualized sinuses are clear.  IMPRESSION: No acute brain finding.  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  CT TEMPORAL BONES  Findings: The external auditory canal on the right shows virtual occlusion by soft tissue density material.  This could represent swelling / inflammation of the soft tissues of the external auditory canal or could represent some material such as wax.  There is mild thickening of the tympanic membrane on the right.  There is some abnormal material in the middle ear adjacent to the ossicles. This could be inflammatory material or early cholesteatoma.  No ossicular destruction.  Inner ear structures appear normal.  A few of the mastoid air cells on the right show opacification.  Left temporal bone appears normal.  IMPRESSION: Soft tissue density material filling the external auditory canal on the right.  This is most consistent with soft tissue swelling due to inflammation.  Tumor or wax buildup could have this same appearance.  Thickening of the tympanic membrane.  Small amount of fluid in the middle ear adjacent to the ossicles in Prussack's space.  This could be inflammatory or indicate early cholesteatoma formation. No ossicular erosion.  Some fluid in the mastoid air cells without evidence of advanced opacification or coalescence.   Original Report Authenticated By: Paulina Fusi, M.D.    Ct Temporal Bones W/o Cm  06/30/2012  *RADIOLOGY REPORT*  Clinical Data:   Right-sided facial and year pain.  CT HEAD AND TEMPORAL BONES WITHOUT CONTRAST  Technique:  Contiguous axial images were obtained from the base of the skull through  the vertex without contrast. Multidetector CT imaging of the temporal bones was performed using the standard protocol without intravenous contrast.  Comparison:  08/25/2005  CT HEAD  Findings: There is no evidence of acute infarction.  There is an old infarction in the left frontal cortical and subcortical white matter region.  No sign of mass lesion, hemorrhage, hydrocephalus or extra-axial collection.  The calvarium is unremarkable. Visualized sinuses are clear.  IMPRESSION: No acute brain finding.  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  CT TEMPORAL BONES  Findings: The external auditory canal on the right shows virtual occlusion by soft tissue density material.  This could represent swelling / inflammation of the soft tissues of the external auditory canal or could represent some material such as wax.  There is mild thickening of the tympanic membrane on the right.  There is some abnormal material in the middle ear adjacent to the ossicles. This could be inflammatory material or early cholesteatoma.  No ossicular destruction.  Inner ear structures appear normal.  A few of the mastoid air cells on the right show opacification.  Left temporal bone appears normal.  IMPRESSION: Soft tissue density material filling the external auditory canal on the right.  This is most consistent with soft tissue swelling due to inflammation.  Tumor or wax buildup could have this same appearance.  Thickening of the tympanic membrane.  Small amount of fluid in the middle ear adjacent to the ossicles in Prussack's space.  This could be inflammatory or indicate early cholesteatoma formation. No ossicular erosion.  Some fluid in the mastoid air cells without evidence of advanced opacification or coalescence.   Original Report Authenticated By: Paulina Fusi,  M.D.      1. Otalgia   2. Otitis externa       MDM  Kristl Morioka is a 57 y.o. female who presents for constant stabbing pain on the right side of her face radiating from her right ear along her right jaw line. Patient seen yesterday and d/c with ativan and norco as well as neuro follow up for trigeminal neuralgia. Patient's symptoms at this time not consistent with this diagnosis, however, as her pain is constant and pain from trigeminal neuralgia comes and goes. CT of head and temporal bones done which showed soft tissue swelling due to inflammation; patient's right external canal unable to be visualized 2/2 swelling. Small amount of swelling also visualized in the middle ear on CT. Patient is nontoxic, well appearing, and VSS. Patient's physical exam and work up consistent with an otitis externa and patient will be treated with ear wick and cortisporin otic in ED today. Instructed to apply cortisporin drops to affected ear 4 times per day. Patient also given script for Augmentin and instruction to follow up in 1 week for ear wick removal. Patient verbalizes understanding and comfort with this plan. Patient seen with Dr. Effie Shy; work up, management, and discharge plan discussed with Dr. Effie Shy who is in agreement.  Filed Vitals:   06/30/12 0703 06/30/12 1024  BP: 144/89 116/70  Pulse: 108 100  Temp: 99.8 F (37.7 C) 98.8 F (37.1 C)  TempSrc: Oral Oral  Resp: 20 16  SpO2: 98% 94%           Antony Madura, PA-C 06/30/12 1720

## 2012-06-30 NOTE — ED Provider Notes (Signed)
Medical screening examination/treatment/procedure(s) were conducted as a shared visit with non-physician practitioner(s) and myself.  I personally evaluated the patient during the encounter  Flint Melter, MD 06/30/12 1807

## 2012-06-30 NOTE — ED Provider Notes (Signed)
Medical screening examination/treatment/procedure(s) were performed by non-physician practitioner and as supervising physician I was immediately available for consultation/collaboration.   Flint Melter, MD 06/30/12 [phone]

## 2012-06-30 NOTE — ED Provider Notes (Signed)
Lori [name] is a 57 y.o. female who presents complaining of severe, constant, pressure-like pain in the right ear and right face, for 3 days. She denies fever. She has decreased hearing in the right ear. She does not have pain with eating.  She was evaluated yesterday, diagnosed with trigeminal neuralgia, and treated symptomatically with Norco and Ativan. She states that she is taking his medicines, but not improving.  Exam: tearful, uncomfortable.  Right external auditory canal is swollen, and occluded. The TM is not visible. There is tenderness to palpation in the preauricular tissues, and the right angle of the jaw. There is no trismus. There are no visible abnormalities of the teeth. There is no intraoral swelling or evident abscess. There is mild tenderness behind the right ear. There is no significant swelling around the right ear and jaw, or neck. The neck has normal range of motion. The airway is intact and there is no respiratory distress.there is no evident rash around the head, neck, or face.  Initial assessment: nonspecific constant pain, unlike trigeminal neuralgia pain, which is typically intermittent. No known history of cancer, multiple sclerosis, brain or neck problems. She'll be evaluated for acute infectious, and tumor, abnormalities.  Pain will be controlled with intra-muscular medications.    Medical screening examination/treatment/procedure(s) were conducted as a shared visit with non-physician practitioner(s) and myself.  I personally evaluated the patient during the encounter  Flint Melter, MD 06/30/12 1806

## 2012-07-01 ENCOUNTER — Encounter: Payer: Self-pay | Admitting: Internal Medicine

## 2012-07-01 ENCOUNTER — Ambulatory Visit (INDEPENDENT_AMBULATORY_CARE_PROVIDER_SITE_OTHER): Payer: Self-pay | Admitting: Internal Medicine

## 2012-07-01 VITALS — BP 143/86 | HR 94 | Temp 97.7°F | Ht 62.0 in | Wt 175.7 lb

## 2012-07-01 DIAGNOSIS — H6690 Otitis media, unspecified, unspecified ear: Secondary | ICD-10-CM

## 2012-07-01 DIAGNOSIS — I10 Essential (primary) hypertension: Secondary | ICD-10-CM

## 2012-07-01 DIAGNOSIS — H6691 Otitis media, unspecified, right ear: Secondary | ICD-10-CM | POA: Insufficient documentation

## 2012-07-01 MED ORDER — CEFTRIAXONE SODIUM 1 G IJ SOLR
1.0000 g | Freq: Once | INTRAMUSCULAR | Status: AC
  Administered 2012-07-01: 1 g via INTRAMUSCULAR

## 2012-07-01 MED ORDER — IBUPROFEN 600 MG PO TABS: 600.0000 mg | ORAL_TABLET | Freq: Four times a day (QID) | ORAL | Status: DC | PRN

## 2012-07-01 MED ORDER — LISINOPRIL 20 MG PO TABS: 20.0000 mg | ORAL_TABLET | Freq: Every day | ORAL | Status: DC

## 2012-07-01 MED ORDER — CEFTRIAXONE SODIUM 1 G IJ SOLR: 2.0000 g | Freq: Once | INTRAMUSCULAR | Status: DC

## 2012-07-01 MED ORDER — AMOXICILLIN 875 MG PO TABS: 875.0000 mg | ORAL_TABLET | Freq: Two times a day (BID) | ORAL | Status: DC

## 2012-07-01 NOTE — Progress Notes (Signed)
Subjective:   Patient ID: Lori [name] female   DOB: [birth year] 57 y.o.   MRN: [id number]  HPI: [name] is a 57 y.o.  female with past medical history significant as outlined below who presented to the clinic for a followup of  emergency room visit. Patient was evaluated on 2/22 and 2/23 in the emergency room for right ear and right face pain. Patient was initially diagnosed with Trigeminal Neuralgia treatment  and was given Ativan and Narco on 2/22 due to persistent pain patient then presented to the ED on the 23rd again. CT of the head and temporal bones were obtained which show soft tissue swelling. Patient was diagnosed with otitis externa and was prescribed ear wick and Cortisporin drops as well Augmentin.   Patient also had been admitted on February 5th for viral gastroenteritis   Patient reports, that had been experiencing a crawling sensation and then she started to have pain in the ear and the face.  The pain is 10/10 in severity, like electricity type/ shooting in nature. It is present at all times. The severity has not changed since Friday. Patient further noted dizziness: feels like if she is on a ship. She could not sleep and can lie down on the side. Patient further the is complaining about dizziness and has the feeling that she is on a ship. No episodes of syncope or fall.  Noted chills but never took her temp.  Denies any congestion, runny nose.   Past Medical History  Diagnosis Date  . Hypertension 2007    Admitted for HTN crisis in 10/2008. Was given in 2010 a wrong prescription from the pharmacy and per ED note it was Lisinopril-Hydrocholorthizide which gave her a  hives and feeling of sickness. Patient was started  on this meds on 05/31/2010 by Dr  Lori [name] without any problem.   . Depression with anxiety 2010    Patient had multiple death in her family over a short period of time. father, brother, uncle and niece who was stabbed 3 years ago. Patient's husband had a car  accident last year and  need currently a feeding tube.   . Insomnia   . History of cervical cancer  1985     status post partial hysterectomy , last Pap smear 10 years ago , no further followup  . Tobacco abuse      35 years  . GERD (gastroesophageal reflux disease)   . History of cocaine abuse 10/23/2008     last documented in 2007 when admitted for hypertensive urgency  . Arthritis   . Depression   . Headache   . Cancer    Current Outpatient Prescriptions  Medication Sig Dispense Refill  . amoxicillin-clavulanate (AUGMENTIN) 875-125 MG per tablet Take 1 tablet by mouth every 12 (twelve) hours.  14 tablet  0  . FLUoxetine (PROZAC) 40 MG capsule Take 40 mg by mouth daily.      Marland Kitchen HYDROcodone-acetaminophen (NORCO/VICODIN) 5-325 MG per tablet Take 2 tablets by mouth every 4 (four) hours as needed for pain.  15 tablet  0  . ibuprofen (ADVIL,MOTRIN) 200 MG tablet Take 800 mg by mouth every 6 (six) hours as needed for pain.      Marland Kitchen lisinopril (PRINIVIL,ZESTRIL) 20 MG tablet Take 20 mg by mouth daily.      Marland Kitchen LORazepam (ATIVAN) 1 MG tablet Take 1 tablet (1 mg total) by mouth 3 (three) times daily as needed (pain).  15 tablet  0  . omeprazole (PRILOSEC)  40 MG capsule Take 1 capsule (40 mg total) by mouth daily.  30 capsule  3  . zolpidem (AMBIEN) 10 MG tablet Take 1 tablet (10 mg total) by mouth at bedtime as needed for sleep.  30 tablet  3   No current facility-administered medications for this visit.   Family History  Problem Relation Age of Onset  . Thyroid disease Mother   . Diabetes Father   . Heart disease Father   . Hyperlipidemia Father   . Hypertension Father   . Diabetes Brother   . Hypertension Brother   . Hyperlipidemia Brother   . Colon polyps Brother   . Thyroid disease Daughter   . Obesity Daughter   . Mental illness Daughter   . Colon cancer Maternal Uncle    History   Social History  . Marital Status: Single    Spouse Name: N/A    Number of Children: N/A  .  Years of Education: N/A   Social History Main Topics  . Smoking status: Current Some Day Smoker -- 0.50 packs/day for 43 years    Types: Cigarettes  . Smokeless tobacco: Never Used  . Alcohol Use: No  . Drug Use: No  . Sexually Active: None   Other Topics Concern  . None   Social History Narrative   Patient had multiple death in her family over a short period of time. father, brother, uncle and niece who was stabbed 3 years ago. Patient's husband had a car accident last year and  need currently a feeding tube.          Review of Systems: Constitutional: Noted fever, chills, diaphoresis, appetite change and fatigue.  HEENT: Noted mild headache  ear pain, neck pain, neck stiffness  but denies hearing loss, congestion, sore throat, rhinorrhea, sneezing, mouth sores, trouble swallowing Respiratory: Denies SOB, DOE, cough, chest tightness,  and wheezing.   Cardiovascular: Denies chest pain, palpitations and leg swelling.  Gastrointestinal: Denies nausea, vomiting, abdominal pain, diarrhea, constipation, blood in stool and abdominal distention.  Neurological: Noted dizziness, denies syncope,   Objective:  Physical Exam: Filed Vitals:   07/01/12 1354  BP: 143/86  Pulse: 94  Temp: 97.7 F (36.5 C)  TempSrc: Oral  Height: 5\' 2"  (1.575 m)  Weight: 175 lb 11.2 oz (79.697 kg)  SpO2: 95%   Constitutional: Vital signs reviewed.  Patient is a well-developed and well-nourished female and cooperative with exam. Alert and oriented x3.  Head: Normocephalic and atraumatic Ear: left ear : TM normal Right ear: ear wick in place  Mouth: no erythema or exudates, MMM Eyes: PERRL, EOMI, conjunctivae normal, No scleral icterus.  Neck: Supple,  Mild tenderness to palpation of right neck area. No mass notable Cardiovascular: RRR, S1 normal, S2 normal, no MRG, pulses symmetric and intact bilaterally Pulmonary/Chest: CTAB, no wheezes, rales, or rhonchi Abdominal: Soft. Non-tender, non-distended,  bowel sounds are normal,  Neurological: A&O x3

## 2012-07-01 NOTE — Assessment & Plan Note (Signed)
Patient was not able to buy Augmentin due to financial restraints. Per up-to-date patient was given 2 g of ceftriaxone IM during the office visit. Also provided the patient a prescription for amoxicillin 850 mg twice a day for 10 days. I informed her that she can obtain the medication [facility] see her for free. She was also given a prescription for ibuprofen 600 mg every 6 hours.

## 2012-07-04 ENCOUNTER — Encounter: Payer: Self-pay | Admitting: Internal Medicine

## 2012-07-04 ENCOUNTER — Ambulatory Visit (INDEPENDENT_AMBULATORY_CARE_PROVIDER_SITE_OTHER): Payer: Self-pay | Admitting: Internal Medicine

## 2012-07-04 VITALS — BP 128/82 | HR 92 | Temp 97.8°F | Resp 20 | Ht 62.5 in | Wt 178.9 lb

## 2012-07-04 DIAGNOSIS — F17200 Nicotine dependence, unspecified, uncomplicated: Secondary | ICD-10-CM

## 2012-07-04 DIAGNOSIS — F329 Major depressive disorder, single episode, unspecified: Secondary | ICD-10-CM

## 2012-07-04 DIAGNOSIS — K219 Gastro-esophageal reflux disease without esophagitis: Secondary | ICD-10-CM

## 2012-07-04 DIAGNOSIS — F3289 Other specified depressive episodes: Secondary | ICD-10-CM

## 2012-07-04 DIAGNOSIS — H6691 Otitis media, unspecified, right ear: Secondary | ICD-10-CM

## 2012-07-04 DIAGNOSIS — G4700 Insomnia, unspecified: Secondary | ICD-10-CM

## 2012-07-04 DIAGNOSIS — I10 Essential (primary) hypertension: Secondary | ICD-10-CM

## 2012-07-04 DIAGNOSIS — H6690 Otitis media, unspecified, unspecified ear: Secondary | ICD-10-CM

## 2012-07-04 NOTE — Assessment & Plan Note (Addendum)
Patient was on Prozac in the past but was not able to afford it for some time. Patient reports that she feels great. Would like to hold off on restarting any medication. Ativan was given from the emergency room during the last visit. Currently she is not taking it anymore.  I will reevaluate patient in one month. She somebody who can easily become depressed and anxious and would  like to monitor her closely

## 2012-07-04 NOTE — Assessment & Plan Note (Signed)
Patient reports Prilosec as her expensive for her. Therefore she is taking dollar tree heartburn medication. She uses only when necessary and her some tenderness is well controlled

## 2012-07-04 NOTE — Patient Instructions (Addendum)
I am proud of you !!!! Continue to do the good work   Stop taking the ear drops

## 2012-07-04 NOTE — Assessment & Plan Note (Signed)
Currently off Ambien. Is using Tylenol PM when necessary. During the next office visit we'll obtain liver function test

## 2012-07-04 NOTE — Assessment & Plan Note (Signed)
Consult smoking cessation. Provided brochures. Patient is ready to quit.

## 2012-07-04 NOTE — Assessment & Plan Note (Signed)
Currently still taking amoxicillin. She will continue for a total of 10 days.

## 2012-07-04 NOTE — Assessment & Plan Note (Signed)
Blood pressure is well controlled. Patient's not taking her blood pressure medication on a regular basis.

## 2012-07-04 NOTE — Progress Notes (Signed)
Subjective:   Patient ID: Lori [name] female   DOB: Aug 24, 1955 57 y.o.   MRN: [id number]  HPI: Lori [name] is a 57 y.o. female with past medical history significant as outlined below who presented to the clinic for a followup. Patient was evaluated on 2/24 for otitis media right ear and was asked to followup. Patient reports that she is feeling a lot better. She has been taking her antibiotics medication and continues to take ear drops. She noted the episodes of sickness the last 2-3 weeks was definitely an eye opener. She noted that she had the feeling that she finally woke up and knew that she needs to take care of herself. She could not be dependent on medications. Since last 2 weeks she had no Ambien. I did not refill her medication and patient noted today that she appreciate that because she had the feeling that she was dependent on it. Currently she's taking only Tylenol PM as needed. And she is getting a good  Night sleep.  Patient noted that her basement is most of the time allotted and she used to drink from well water. But after the episode of nausea, vomiting diarrhea she just drinks bottled water. She now feels a lot better.     Past Medical History  Diagnosis Date  . Hypertension 2007    Admitted for HTN crisis in 10/2008. Was given in 2010 a wrong prescription from the pharmacy and per ED note it was Lisinopril-Hydrocholorthizide which gave her a  hives and feeling of sickness. Patient was started  on this meds on 05/31/2010 by Dr  Bradd Canary without any problem.   . Depression with anxiety 2010    Patient had multiple death in her family over a short period of time. father, brother, uncle and niece who was stabbed 3 years ago. Patient's husband had a car accident last year and  need currently a feeding tube.   . Insomnia   . History of cervical cancer  1985     status post partial hysterectomy , last Pap smear 10 years ago , no further followup  . Tobacco abuse      35 years   . GERD (gastroesophageal reflux disease)   . History of cocaine abuse 10/23/2008     last documented in 2007 when admitted for hypertensive urgency  . Arthritis   . Depression   . Headache   . Cancer    Current Outpatient Prescriptions  Medication Sig Dispense Refill  . amoxicillin (AMOXIL) 875 MG tablet Take 1 tablet (875 mg total) by mouth 2 (two) times daily.  20 tablet  0  . Diphenhydramine-APAP, sleep, (TYLENOL PM EXTRA STRENGTH PO) Take by mouth.      Marland Kitchen ibuprofen (ADVIL,MOTRIN) 600 MG tablet Take 1 tablet (600 mg total) by mouth every 6 (six) hours as needed for pain (For 1 week).  30 tablet  0  . lisinopril (PRINIVIL,ZESTRIL) 20 MG tablet Take 1 tablet (20 mg total) by mouth daily.  30 tablet  2   No current facility-administered medications for this visit.   Family History  Problem Relation Age of Onset  . Thyroid disease Mother   . Diabetes Father   . Heart disease Father   . Hyperlipidemia Father   . Hypertension Father   . Diabetes Brother   . Hypertension Brother   . Hyperlipidemia Brother   . Colon polyps Brother   . Thyroid disease Daughter   . Obesity Daughter   . Mental illness  Daughter   . Colon cancer Maternal Uncle    History   Social History  . Marital Status: Single    Spouse Name: N/A    Number of Children: N/A  . Years of Education: N/A   Social History Main Topics  . Smoking status: Current Some Day Smoker -- 0.50 packs/day for 43 years    Types: Cigarettes  . Smokeless tobacco: Never Used     Comment: patches have helped in the past  . Alcohol Use: No  . Drug Use: No  . Sexually Active: Not on file   Other Topics Concern  . Not on file   Social History Narrative   Patient had multiple death in her family over a short period of time. father, brother, uncle and niece who was stabbed 3 years ago. Patient's husband had a car accident last year and  need currently a feeding tube.          Review of Systems: Constitutional: Denies fever,  chills, diaphoresis, appetite change and fatigue.  HEENT: Improved  ear pain but denies  congestion, sore throat, rhinorrhea, Respiratory: Denies SOB, DOE, cough, chest tightness,  and wheezing.   Cardiovascular: Denies chest pain, palpitations and leg swelling.  Gastrointestinal: Denies nausea, vomiting, abdominal pain, diarrhea, constipation, blood in stool and abdominal distention.  Neurological: Denies dizziness or headaches.    Objective:  Physical Exam: Filed Vitals:   07/04/12 1426  BP: 128/82  Pulse: 92  Temp: 97.8 F (36.6 C)  TempSrc: Oral  Resp: 20  Height: 5' 2.5" (1.588 m)  Weight: 178 lb 14.4 oz (81.149 kg)  SpO2: 96%   Constitutional: Vital signs reviewed.  Patient is a well-developed and well-nourished female in no acute distress and cooperative with exam. Alert and oriented x3.  Ear: TM normal bilaterally Mouth: no erythema or exudates, MMM Eyes: PERRL, EOMI, conjunctivae normal, No scleral icterus.  Neck: Supple Cardiovascular: RRR, S1 normal, S2 normal, no MRG, pulses symmetric and intact bilaterally Pulmonary/Chest: CTAB, no wheezes, rales, or rhonchi Abdominal: Soft. Non-tender, non-distended, bowel sounds are normal,  Hematology: no cervical adenopathy.  Neurological: A&O x3.  Psychiatric: Normal mood and affect. speech and behavior is normal.

## 2012-07-15 ENCOUNTER — Telehealth: Payer: Self-pay | Admitting: *Deleted

## 2012-07-15 NOTE — Telephone Encounter (Signed)
Pt calls and states she has not taken zolpidem for 3 weeks and is not sleeping, no rest at all, desires to restart zolpidem, please advise

## 2012-07-17 NOTE — Telephone Encounter (Signed)
I will call patient and will discuss about options for insomnia

## 2014-04-09 ENCOUNTER — Emergency Department (HOSPITAL_COMMUNITY)
Admission: EM | Admit: 2014-04-09 | Discharge: 2014-04-09 | Disposition: A | Discharge status: Home / Self Care | Payer: Medicaid Other | Attending: Emergency Medicine | Admitting: Emergency Medicine

## 2014-04-09 DIAGNOSIS — M1990 Unspecified osteoarthritis, unspecified site: Secondary | ICD-10-CM | POA: Insufficient documentation

## 2014-04-09 DIAGNOSIS — R040 Epistaxis: Secondary | ICD-10-CM | POA: Insufficient documentation

## 2014-04-09 DIAGNOSIS — Z8541 Personal history of malignant neoplasm of cervix uteri: Secondary | ICD-10-CM | POA: Insufficient documentation

## 2014-04-09 DIAGNOSIS — G4700 Insomnia, unspecified: Secondary | ICD-10-CM | POA: Insufficient documentation

## 2014-04-09 DIAGNOSIS — Z8659 Personal history of other mental and behavioral disorders: Secondary | ICD-10-CM | POA: Diagnosis not present

## 2014-04-09 DIAGNOSIS — Z792 Long term (current) use of antibiotics: Secondary | ICD-10-CM | POA: Insufficient documentation

## 2014-04-09 DIAGNOSIS — Z8719 Personal history of other diseases of the digestive system: Secondary | ICD-10-CM | POA: Insufficient documentation

## 2014-04-09 DIAGNOSIS — I10 Essential (primary) hypertension: Secondary | ICD-10-CM | POA: Insufficient documentation

## 2014-04-09 DIAGNOSIS — Z79899 Other long term (current) drug therapy: Secondary | ICD-10-CM | POA: Diagnosis not present

## 2014-04-09 DIAGNOSIS — Z720 Tobacco use: Secondary | ICD-10-CM | POA: Diagnosis not present

## 2014-04-09 LAB — CBC WITH DIFFERENTIAL/PLATELET
Basophils Absolute: 0 10*3/uL (ref 0.0–0.1)
Basophils Relative: 0 % (ref 0–1)
Eosinophils Absolute: 0.1 10*3/uL (ref 0.0–0.7)
Eosinophils Relative: 1 % (ref 0–5)
HCT: 40.1 % (ref 36.0–46.0)
Hemoglobin: 13 g/dL (ref 12.0–15.0)
Lymphocytes Relative: 19 % (ref 12–46)
Lymphs Abs: 1.5 10*3/uL (ref 0.7–4.0)
MCH: 28.4 pg (ref 26.0–34.0)
MCHC: 32.4 g/dL (ref 30.0–36.0)
MCV: 87.7 fL (ref 78.0–100.0)
Monocytes Absolute: 0.4 10*3/uL (ref 0.1–1.0)
Monocytes Relative: 5 % (ref 3–12)
Neutro Abs: 5.9 10*3/uL (ref 1.7–7.7)
Neutrophils Relative %: 75 % (ref 43–77)
Platelets: 211 10*3/uL (ref 150–400)
RBC: 4.57 MIL/uL (ref 3.87–5.11)
RDW: 13.5 % (ref 11.5–15.5)
WBC: 7.9 10*3/uL (ref 4.0–10.5)

## 2014-04-09 LAB — I-STAT TROPONIN, ED: Troponin i, poc: 0 ng/mL (ref 0.00–0.08)

## 2014-04-09 LAB — BASIC METABOLIC PANEL
Anion gap: 14 (ref 5–15)
BUN: 17 mg/dL (ref 6–23)
CO2: 21 mEq/L (ref 19–32)
Calcium: 9.3 mg/dL (ref 8.4–10.5)
Chloride: 102 mEq/L (ref 96–112)
Creatinine, Ser: 0.94 mg/dL (ref 0.50–1.10)
GFR calc Af Amer: 77 mL/min — ABNORMAL LOW (ref >90)
GFR calc non Af Amer: 66 mL/min — ABNORMAL LOW (ref >90)
Glucose, Bld: 133 mg/dL — ABNORMAL HIGH (ref 70–99)
Potassium: 4.2 mEq/L (ref 3.7–5.3)
Sodium: 137 mEq/L (ref 137–147)

## 2014-04-09 LAB — PROTIME-INR
INR: 0.98 (ref 0.00–1.49)
Prothrombin Time: 13.1 seconds (ref 11.6–15.2)

## 2014-04-09 LAB — APTT: aPTT: 27 seconds (ref 24–37)

## 2014-04-09 MED ORDER — OXYMETAZOLINE HCL 0.05 % NA SOLN
1.0000 | Freq: Once | NASAL | Status: AC
  Administered 2014-04-09: 2 via NASAL
  Filled 2014-04-09: qty 15

## 2014-04-09 MED ORDER — ACETAMINOPHEN 325 MG PO TABS
650.0000 mg | ORAL_TABLET | Freq: Once | ORAL | Status: AC
  Administered 2014-04-09: 650 mg via ORAL
  Filled 2014-04-09: qty 2

## 2014-04-09 NOTE — ED Notes (Signed)
The pt has had a nosebleed for 3 days intermittently.  She is actively bleeding at present with  Clots.  No blood thinners

## 2014-04-09 NOTE — Discharge Instructions (Signed)

## 2014-04-09 NOTE — ED Notes (Signed)
Patient remains on the monitor.  Patient family at bedside

## 2014-04-09 NOTE — ED Provider Notes (Signed)
CSN: [id number]     Arrival date & time 04/09/14  0522 History   First MD Initiated Contact with Patient 04/09/14 [phone]     Chief Complaint  Patient presents with  . Epistaxis     (Consider location/radiation/quality/duration/timing/severity/associated sxs/prior Treatment) HPI Patient states she's had 3 days of intermittent nose bleeding. We'll briefly stop after holding her nose and then began again after spitting out clots. Patient has no previous history of epistaxis. He is currently on no anticoagulants. Denies lightheadedness or syncope. Past Medical History  Diagnosis Date  . Hypertension 2007    Admitted for HTN crisis in 10/2008. Was given in 2010 a wrong prescription from the pharmacy and per ED note it was Lisinopril-Hydrocholorthizide which gave her a  hives and feeling of sickness. Patient was started  on this meds on 05/31/2010 by Dr  Ihor Gully without any problem.   . Depression with anxiety 2010    Patient had multiple death in her family over a short period of time. father, brother, uncle and niece who was stabbed 3 years ago. Patient's husband had a car accident last year and  need currently a feeding tube.   . Insomnia   . History of cervical cancer  1985     status post partial hysterectomy , last Pap smear 10 years ago , no further followup  . Tobacco abuse      35 years  . GERD (gastroesophageal reflux disease)   . History of cocaine abuse 10/23/2008     last documented in 2007 when admitted for hypertensive urgency  . Arthritis   . Depression   . Headache   . Cancer    Past Surgical History  Procedure Laterality Date  . Abdominal hysterectomy    . Cesarean section  1985  . Skin graft     Family History  Problem Relation Age of Onset  . Thyroid disease Mother   . Diabetes Father   . Heart disease Father   . Hyperlipidemia Father   . Hypertension Father   . Diabetes Brother   . Hypertension Brother   . Hyperlipidemia Brother   . Colon polyps Brother    . Thyroid disease Daughter   . Obesity Daughter   . Mental illness Daughter   . Colon cancer Maternal Uncle    History  Substance Use Topics  . Smoking status: Current Some Day Smoker -- 0.50 packs/day for 43 years    Types: Cigarettes  . Smokeless tobacco: Never Used     Comment: patches have helped in the past  . Alcohol Use: No   OB History    No data available     Review of Systems  Constitutional: Negative for fever and chills.  HENT: Positive for nosebleeds.   Respiratory: Negative for shortness of breath.   Cardiovascular: Negative for chest pain.  Gastrointestinal: Negative for nausea and vomiting.  Neurological: Negative for dizziness, syncope, weakness, light-headedness and numbness.  All other systems reviewed and are negative.     Allergies  Review of patient's allergies indicates no known allergies.  Home Medications   Prior to Admission medications   Medication Sig Start Date End Date Taking? Authorizing Provider  Aspirin-Acetaminophen-Caffeine (GOODYS EXTRA STRENGTH PO) Take 1 packet by mouth every 8 (eight) hours as needed (headache).   Yes Historical Provider, MD  diphenhydramine-acetaminophen (TYLENOL PM) 25-500 MG TABS Take 1-2 tablets by mouth at bedtime as needed (sleep).   Yes Historical Provider, MD  HYDROcodone-acetaminophen (NORCO/VICODIN) 5-325 MG per  tablet Take 0.5-1 tablets by mouth every 6 (six) hours as needed for moderate pain or severe pain.   Yes Historical Provider, MD  amoxicillin (AMOXIL) 875 MG tablet Take 1 tablet (875 mg total) by mouth 2 (two) times daily. 07/01/12   Rosalia Hammers, MD  Diphenhydramine-APAP, sleep, (TYLENOL PM EXTRA STRENGTH PO) Take by mouth.    Historical Provider, MD  ibuprofen (ADVIL,MOTRIN) 600 MG tablet Take 1 tablet (600 mg total) by mouth every 6 (six) hours as needed for pain (For 1 week). 07/01/12   Rosalia Hammers, MD  lisinopril (PRINIVIL,ZESTRIL) 20 MG tablet Take 1 tablet (20 mg total) by mouth daily.  07/01/12   Rosalia Hammers, MD   BP 148/73 mmHg  Pulse 82  Temp(Src) 97.9 F (36.6 C) (Oral)  Resp 18  Ht 5' 2.5" (1.588 m)  Wt 166 lb (75.297 kg)  BMI 29.86 kg/m2  SpO2 99% Physical Exam  Constitutional: She is oriented to person, place, and time. She appears well-developed and well-nourished. No distress.  HENT:  Head: Normocephalic and atraumatic.  Mouth/Throat: Oropharynx is clear and moist.  No active bleeding visualized in the anterior nares. The patient is spitting up clots.  Eyes: EOM are normal. Pupils are equal, round, and reactive to light.  Neck: Normal range of motion. Neck supple.  Cardiovascular: Normal rate and regular rhythm.   Pulmonary/Chest: Effort normal and breath sounds normal. No respiratory distress. She has no wheezes. She has no rales.  Abdominal: Soft. Bowel sounds are normal.  Musculoskeletal: Normal range of motion. She exhibits no edema or tenderness.  Neurological: She is alert and oriented to person, place, and time.  Skin: Skin is warm and dry. No rash noted. No erythema.  Psychiatric: She has a normal mood and affect. Her behavior is normal.  Nursing note and vitals reviewed.   ED Course  Procedures (including critical care time) Labs Review Labs Reviewed  BASIC METABOLIC PANEL - Abnormal; Notable for the following:    Glucose, Bld 133 (*)    GFR calc non Af Amer 66 (*)    GFR calc Af Amer 77 (*)    All other components within normal limits  CBC WITH DIFFERENTIAL  PROTIME-INR  APTT  I-STAT TROPOININ, ED    Imaging Review No results found.   EKG Interpretation None      Date: 04/09/2014  Rate: 93  Rhythm: normal sinus rhythm  QRS Axis: normal  Intervals: normal  ST/T Wave abnormalities: normal  Conduction Disutrbances:none  Narrative Interpretation:   Old EKG Reviewed: unchanged   MDM   Final diagnoses:  Epistaxis, recurrent    Patient given Afrin nasal spray in the emergency department and advised on how to properly  hold pressure over the nose. Held pressure constantly for 15 minutes. Bleeding is stopped. Per nurse at one point patient reported mild chest pain during this process. She currently has no chest pain. She has a normal EKG.  Normal labs including hemoglobin and guaiac studies. No further bleeding in the emergency department. Return precautions given. Also advised follow-up with ENT especially for persistent symptoms.  Julianne Rice, MD 04/09/14 [phone]

## 2014-04-09 NOTE — ED Notes (Signed)
Patient reports she is feeling weak.  Patient denies taking any blood thinners.  She reports she does smoke and has dry heat in her home.

## 2014-04-09 NOTE — ED Notes (Signed)
She is also c/o a headache

## 2014-04-09 NOTE — ED Notes (Signed)
Patient now reports she is having some chest pain.  Placed on 12 lead.  Will inform Md and initiate protocol

## 2014-04-10 ENCOUNTER — Encounter (HOSPITAL_COMMUNITY): Payer: Self-pay | Admitting: *Deleted

## 2014-04-10 ENCOUNTER — Emergency Department (HOSPITAL_COMMUNITY)
Admission: EM | Admit: 2014-04-10 | Discharge: 2014-04-10 | Discharge status: Against Medical Advice | Payer: Medicaid Other | Attending: Emergency Medicine | Admitting: Emergency Medicine

## 2014-04-10 DIAGNOSIS — I10 Essential (primary) hypertension: Secondary | ICD-10-CM | POA: Insufficient documentation

## 2014-04-10 DIAGNOSIS — Z720 Tobacco use: Secondary | ICD-10-CM | POA: Diagnosis not present

## 2014-04-10 DIAGNOSIS — R040 Epistaxis: Secondary | ICD-10-CM | POA: Insufficient documentation

## 2014-04-10 NOTE — ED Notes (Signed)
Pt reports being dc home yesterday for nosebleed. Pt reports it started bleeding again this am 0500 from both nares. Bleeding controlled at this time. Pt has appt today at 1300 with ENT but came here due to amount of bleeding today and pt denies being on blood thinners.

## 2014-04-10 NOTE — ED Notes (Signed)
No answer x3

## 2014-04-10 NOTE — ED Notes (Signed)
No answer when called to recheck vitals

## 2014-11-27 ENCOUNTER — Encounter: Payer: Self-pay | Admitting: Internal Medicine

## 2014-11-27 ENCOUNTER — Ambulatory Visit (INDEPENDENT_AMBULATORY_CARE_PROVIDER_SITE_OTHER): Payer: Medicaid Other | Admitting: Internal Medicine

## 2014-11-27 VITALS — BP 179/75 | HR 77 | Temp 98.2°F | Wt 169.2 lb

## 2014-11-27 DIAGNOSIS — F17210 Nicotine dependence, cigarettes, uncomplicated: Secondary | ICD-10-CM | POA: Diagnosis not present

## 2014-11-27 DIAGNOSIS — I10 Essential (primary) hypertension: Secondary | ICD-10-CM

## 2014-11-27 DIAGNOSIS — M79604 Pain in right leg: Secondary | ICD-10-CM

## 2014-11-27 DIAGNOSIS — G4700 Insomnia, unspecified: Secondary | ICD-10-CM

## 2014-11-27 DIAGNOSIS — M79605 Pain in left leg: Secondary | ICD-10-CM

## 2014-11-27 DIAGNOSIS — J309 Allergic rhinitis, unspecified: Secondary | ICD-10-CM

## 2014-11-27 DIAGNOSIS — F418 Other specified anxiety disorders: Secondary | ICD-10-CM

## 2014-11-27 DIAGNOSIS — M79606 Pain in leg, unspecified: Secondary | ICD-10-CM

## 2014-11-27 MED ORDER — ZOLPIDEM TARTRATE 5 MG PO TABS: 5.0000 mg | ORAL_TABLET | Freq: Every evening | ORAL | Status: AC | PRN

## 2014-11-27 MED ORDER — LISINOPRIL 20 MG PO TABS: 20.0000 mg | ORAL_TABLET | Freq: Every day | ORAL | Status: DC

## 2014-11-27 MED ORDER — LORATADINE 10 MG PO TABS: 10.0000 mg | ORAL_TABLET | Freq: Every day | ORAL | Status: DC

## 2014-11-27 MED ORDER — FLUOXETINE HCL 20 MG PO TABS: 20.0000 mg | ORAL_TABLET | Freq: Every day | ORAL | Status: DC

## 2014-11-27 MED ORDER — FLUTICASONE PROPIONATE 50 MCG/ACT NA SUSP: 2.0000 | Freq: Every day | NASAL | Status: DC

## 2014-11-27 NOTE — Patient Instructions (Signed)
Thank you for your visit today  Please start taking lisinopril for your blood pressure  Please start taking prozac for your depressive symptoms.  Please take the claritin and the nasal spray for your allergies  Please take the ambien as needed at bedtime. It will cause drowsiness so please do not drive when you take ambien.  Please work on stopping smoking.  Please follow up in 1 week  Hypertension Hypertension, commonly called high blood pressure, is when the force of blood pumping through your arteries is too strong. Your arteries are the blood vessels that carry blood from your heart throughout your body. A blood pressure reading consists of a higher number over a lower number, such as 110/72. The higher number (systolic) is the pressure inside your arteries when your heart pumps. The lower number (diastolic) is the pressure inside your arteries when your heart relaxes. Ideally you want your blood pressure below 120/80. Hypertension forces your heart to work harder to pump blood. Your arteries may become narrow or stiff. Having hypertension puts you at risk for heart disease, stroke, and other problems.  RISK FACTORS Some risk factors for high blood pressure are controllable. Others are not.  Risk factors you cannot control include:   Race. You may be at higher risk if you are African American.  Age. Risk increases with age.  Gender. Men are at higher risk than women before age 48 years. After age 90, women are at higher risk than men. Risk factors you can control include:  Not getting enough exercise or physical activity.  Being overweight.  Getting too much fat, sugar, calories, or salt in your diet.  Drinking too much alcohol. SIGNS AND SYMPTOMS Hypertension does not usually cause signs or symptoms. Extremely high blood pressure (hypertensive crisis) may cause headache, anxiety, shortness of breath, and nosebleed. DIAGNOSIS  To check if you have hypertension, your health  care provider will measure your blood pressure while you are seated, with your arm held at the level of your heart. It should be measured at least twice using the same arm. Certain conditions can cause a difference in blood pressure between your right and left arms. A blood pressure reading that is higher than normal on one occasion does not mean that you need treatment. If one blood pressure reading is high, ask your health care provider about having it checked again. TREATMENT  Treating high blood pressure includes making lifestyle changes and possibly taking medicine. Living a healthy lifestyle can help lower high blood pressure. You may need to change some of your habits. Lifestyle changes may include:  Following the DASH diet. This diet is high in fruits, vegetables, and whole grains. It is low in salt, red meat, and added sugars.  Getting at least 2 hours of brisk physical activity every week.  Losing weight if necessary.  Not smoking.  Limiting alcoholic beverages.  Learning ways to reduce stress. If lifestyle changes are not enough to get your blood pressure under control, your health care provider may prescribe medicine. You may need to take more than one. Work closely with your health care provider to understand the risks and benefits. HOME CARE INSTRUCTIONS  Have your blood pressure rechecked as directed by your health care provider.   Take medicines only as directed by your health care provider. Follow the directions carefully. Blood pressure medicines must be taken as prescribed. The medicine does not work as well when you skip doses. Skipping doses also puts you at risk for  problems.   Do not smoke.   Monitor your blood pressure at home as directed by your health care provider. SEEK MEDICAL CARE IF:   You think you are having a reaction to medicines taken.  You have recurrent headaches or feel dizzy.  You have swelling in your ankles.  You have trouble with your  vision. SEEK IMMEDIATE MEDICAL CARE IF:  You develop a severe headache or confusion.  You have unusual weakness, numbness, or feel faint.  You have severe chest or abdominal pain.  You vomit repeatedly.  You have trouble breathing. MAKE SURE YOU:   Understand these instructions.  Will watch your condition.  Will get help right away if you are not doing well or get worse. Document Released: 04/24/2005 Document Revised: 09/08/2013 Document Reviewed: 02/14/2013 [facility] Patient Information 2015 [place], [place]. This information is not intended to replace advice given to you by your health care provider. Make sure you discuss any questions you have with your health care provider.   Allergic Rhinitis Allergic rhinitis is when the mucous membranes in the nose respond to allergens. Allergens are particles in the air that cause your body to have an allergic reaction. This causes you to release allergic antibodies. Through a chain of events, these eventually cause you to release histamine into the blood stream. Although meant to protect the body, it is this release of histamine that causes your discomfort, such as frequent sneezing, congestion, and an itchy, runny nose.  CAUSES  Seasonal allergic rhinitis (hay fever) is caused by pollen allergens that may come from grasses, trees, and weeds. Year-round allergic rhinitis (perennial allergic rhinitis) is caused by allergens such as house dust mites, pet dander, and mold spores.  SYMPTOMS   Nasal stuffiness (congestion).  Itchy, runny nose with sneezing and tearing of the eyes. DIAGNOSIS  Your health care provider can help you determine the allergen or allergens that trigger your symptoms. If you and your health care provider are unable to determine the allergen, skin or blood testing may be used. TREATMENT  Allergic rhinitis does not have a cure, but it can be controlled by:  Medicines and allergy shots (immunotherapy).  Avoiding the  allergen. Hay fever may often be treated with antihistamines in pill or nasal spray forms. Antihistamines block the effects of histamine. There are over-the-counter medicines that may help with nasal congestion and swelling around the eyes. Check with your health care provider before taking or giving this medicine.  If avoiding the allergen or the medicine prescribed do not work, there are many new medicines your health care provider can prescribe. Stronger medicine may be used if initial measures are ineffective. Desensitizing injections can be used if medicine and avoidance does not work. Desensitization is when a patient is given ongoing shots until the body becomes less sensitive to the allergen. Make sure you follow up with your health care provider if problems continue. HOME CARE INSTRUCTIONS It is not possible to completely avoid allergens, but you can reduce your symptoms by taking steps to limit your exposure to them. It helps to know exactly what you are allergic to so that you can avoid your specific triggers. SEEK MEDICAL CARE IF:   You have a fever.  You develop a cough that does not stop easily (persistent).  You have shortness of breath.  You start wheezing.  Symptoms interfere with normal daily activities. Document Released: 01/17/2001 Document Revised: 04/29/2013 Document Reviewed: 12/30/2012 [facility] Patient Information 2015 [place], [place]. This information is not intended  to replace advice given to you by your health care provider. Make sure you discuss any questions you have with your health care provider.

## 2014-11-27 NOTE — Assessment & Plan Note (Addendum)
Pt says she is having dry cough, runny nose and itchy eyes for few weeks. She uses wood stove at home, and also smokes 1 pack of cigs every 4 days.  PE entirely benign.  Told her both of these things are contributing to her allergies. And advised smoking cessation Asked her to take claritin 10 mg, and flonase nasal spary

## 2014-11-27 NOTE — Progress Notes (Signed)
Patient ID: Lori [name], female   DOB: Sep 17, 1955, 59 y.o.   MRN: [id number]    Subjective:   Patient ID: Lori [name] female   DOB: 1955-08-26 59 y.o.   MRN: [id number]  HPI: [name] is a 59 y.o. woman who came to re-establish care here with me. She had been coming here few years back but stopped as she did not have insurance. She recently got Medicaid. Her primary concerns today were hypertension- off of her meds, insomnia, anxiety/depression, and allergies.  Please see problem list for HPIs. Other notable history below.    Past Medical History  Diagnosis Date  . Hypertension 2007    Admitted for HTN crisis in 10/2008. Was given in 2010 a wrong prescription from the pharmacy and per ED note it was Lisinopril-Hydrocholorthizide which gave her a  hives and feeling of sickness. Patient was started  on this meds on 05/31/2010 by Dr  Ihor Gully without any problem.   . Depression with anxiety 2010    Patient had multiple death in her family over a short period of time. father, brother, uncle and niece who was stabbed 3 years ago. Patient's husband had a car accident last year and  need currently a feeding tube.   . Insomnia   . History of cervical cancer  1985     status post partial hysterectomy , last Pap smear 10 years ago , no further followup  . Tobacco abuse      35 years  . GERD (gastroesophageal reflux disease)   . History of cocaine abuse 10/23/2008     last documented in 2007 when admitted for hypertensive urgency  . Arthritis   . Depression   . Headache(784.0)   . Cancer    Current Outpatient Prescriptions  Medication Sig Dispense Refill  . diphenhydramine-acetaminophen (TYLENOL PM) 25-500 MG TABS Take 1-2 tablets by mouth at bedtime as needed (sleep).    Marland Kitchen ibuprofen (ADVIL,MOTRIN) 600 MG tablet Take 1 tablet (600 mg total) by mouth every 6 (six) hours as needed for pain (For 1 week). 30 tablet 0  . amoxicillin (AMOXIL) 875 MG tablet Take 1 tablet (875 mg total) by  mouth 2 (two) times daily. (Patient not taking: Reported on 11/27/2014) 20 tablet 0  . Diphenhydramine-APAP, sleep, (TYLENOL PM EXTRA STRENGTH PO) Take by mouth.    Marland Kitchen FLUoxetine (PROZAC) 20 MG tablet Take 1 tablet (20 mg total) by mouth daily. 30 tablet 3  . fluticasone (FLONASE) 50 MCG/ACT nasal spray Place 2 sprays into both nostrils daily. 16 g 2  . HYDROcodone-acetaminophen (NORCO/VICODIN) 5-325 MG per tablet Take 0.5-1 tablets by mouth every 6 (six) hours as needed for moderate pain or severe pain.    Marland Kitchen lisinopril (PRINIVIL,ZESTRIL) 20 MG tablet Take 1 tablet (20 mg total) by mouth daily. 30 tablet 2  . loratadine (CLARITIN) 10 MG tablet Take 1 tablet (10 mg total) by mouth daily. 30 tablet 2  . zolpidem (AMBIEN) 5 MG tablet Take 1 tablet (5 mg total) by mouth at bedtime as needed for sleep. 30 tablet 0   No current facility-administered medications for this visit.   Family History  Problem Relation Age of Onset  . Thyroid disease Mother   . Diabetes Father   . Heart disease Father   . Hyperlipidemia Father   . Hypertension Father   . Diabetes Brother   . Hypertension Brother   . Hyperlipidemia Brother   . Colon polyps Brother   . Thyroid disease Daughter   .  Obesity Daughter   . Mental illness Daughter   . Colon cancer Maternal Uncle    History   Social History  . Marital Status: Single    Spouse Name: N/A  . Number of Children: N/A  . Years of Education: N/A   Social History Main Topics  . Smoking status: Current Some Day Smoker -- 0.50 packs/day for 43 years    Types: Cigarettes  . Smokeless tobacco: Never Used     Comment: patches have helped in the past  . Alcohol Use: No  . Drug Use: No  . Sexual Activity: Not on file   Other Topics Concern  . None   Social History Narrative   Patient had multiple death in her family over a short period of time. father, brother, uncle and niece who was stabbed 3 years ago. Patient's husband had a car accident last year and   need currently a feeding tube.          Review of Systems: Review of Systems  Constitutional: Negative.  Negative for fever, chills, weight loss, malaise/fatigue and diaphoresis.  HENT: Negative.        Runny nose  Eyes: Negative for blurred vision, double vision and photophobia.  Respiratory: Positive for cough. Negative for sputum production, shortness of breath and wheezing.   Cardiovascular: Negative for chest pain, palpitations, orthopnea, claudication and leg swelling.  Gastrointestinal: Negative for nausea, vomiting, abdominal pain and diarrhea.  Musculoskeletal: Negative.  Negative for falls.  Neurological: Negative for dizziness, tingling, sensory change, focal weakness and weakness.  Psychiatric/Behavioral: Positive for depression. Negative for suicidal ideas and substance abuse. The patient is nervous/anxious and has insomnia.     Objective:  Physical Exam: Filed Vitals:   11/27/14 1530  BP: 179/75  Pulse: 77  Temp: 98.2 F (36.8 C)  TempSrc: Oral  Weight: 169 lb 3.2 oz (76.749 kg)  SpO2: 100%   Physical Exam  Constitutional: She is oriented to person, place, and time and well-developed, well-nourished, and in no distress.  Eyes: EOM are normal. Pupils are equal, round, and reactive to light.  Neck: Normal range of motion. Neck supple. No thyromegaly present.  Cardiovascular: Regular rhythm, S1 normal and S2 normal.  Exam reveals no S3.   No murmur heard. Pulmonary/Chest: Effort normal and breath sounds normal. She has no wheezes.  Abdominal: Soft. Bowel sounds are normal. She exhibits no distension.  Lymphadenopathy:    She has no cervical adenopathy.  Neurological: She is alert and oriented to person, place, and time. Gait normal.  Psychiatric: Affect normal. Her mood appears anxious. She exhibits a depressed mood.  Sleep- she is unable to sleep- only 3-4 hours at night, trouble both falling asleep, and staying asleep, cries to sleep I: she has lost interest in  doing her hobbies G: does not feel guilty E: low energy in doing household chores and ADLs C: normal concentration A: no change in apetite P: has some anxiety S: nO SI/hi     Assessment & Plan:   Please see problem based charting for assessment and plan

## 2014-11-27 NOTE — Assessment & Plan Note (Addendum)
Pt says that she has been having these symptoms for few weeks to months now as there is a lot going on in her family. She has been taking 6-7 pain meds at night to go to sleep- and I told her to stop that.  She goes to sleep around 2.30 am and getsup at 6 pm  She is having issues with her 2 kids, and one is in the rehab. She also has a lot of anxiety and has some palpitations. She was given prozac in past and she said it helped her.  H&P consistent with depressive symptoms combined with anxiety  Plan is to start prozac 20 mg, and titrate in future as necessary Will f/u in 1 week I told the patient that it will take 2-4 weeks for the medication to fully work and she should take it for the entire duration before changing it.

## 2014-11-27 NOTE — Addendum Note (Signed)
Addended by: Burgess Estelle A on: 11/27/2014 06:13 PM   Modules accepted: Orders

## 2014-11-27 NOTE — Assessment & Plan Note (Signed)
Pt says she is unable to fall asleep for few weeks Ambien has worked in the past.  Plan- given ambien 5 mg at bedtime, instructed regarding side effects. Advised the patient to eventually stop taking it once prozac fully starts working.  F/u in 1 week

## 2014-11-27 NOTE — Assessment & Plan Note (Signed)
Bilateral throbbing leg pain at night- when she is trying to fall asleep. PE entirely normal, She does not feel the urge to move her legs but just has the pain. No neuropathy type pain It may be a symptom of her depression and anxiety.  We will wait and see how these meds do before doing anything else.

## 2014-11-27 NOTE — Assessment & Plan Note (Addendum)
BP Readings from Last 3 Encounters:  11/27/14 179/75  04/10/14 182/78  04/09/14 148/73  Pt states that she has been off of her meds for months now as she is unable to afford her medications. She was having some headaches.  BP is not at goal. 179/75. I verified that she is not taking cocaine or other illicit drugs as pt has a history of illicit use.  I started her lisinopril 20 mg, and she will follow up in 1 week to re-assess and titrate accordingly. Ordered BMP but the lab was closed and offered pt to have it done upstairs, pt said she will just do it in 1 week when she comes back to see me.

## 2014-11-30 NOTE — Progress Notes (Signed)
Internal Medicine Clinic Attending  I saw and evaluated the patient.  I personally confirmed the key portions of the history and exam documented by Dr. Saraiya and I reviewed pertinent patient test results.  The assessment, diagnosis, and plan were formulated together and I agree with the documentation in the resident's note.  

## 2014-12-02 ENCOUNTER — Other Ambulatory Visit (INDEPENDENT_AMBULATORY_CARE_PROVIDER_SITE_OTHER): Payer: Medicaid Other

## 2014-12-02 DIAGNOSIS — I10 Essential (primary) hypertension: Secondary | ICD-10-CM

## 2014-12-02 LAB — COMPLETE METABOLIC PANEL WITH GFR
ALT: 7 U/L (ref 6–29)
AST: 15 U/L (ref 10–35)
Albumin: 4.4 g/dL (ref 3.6–5.1)
Alkaline Phosphatase: 69 U/L (ref 33–130)
BUN: 9 mg/dL (ref 7–25)
CO2: 24 mEq/L (ref 20–31)
Calcium: 9.6 mg/dL (ref 8.6–10.4)
Chloride: 102 mEq/L (ref 98–110)
Creat: 1 mg/dL (ref 0.50–1.05)
GFR, Est African American: 72 mL/min (ref >=60)
GFR, Est Non African American: 62 mL/min (ref >=60)
Glucose, Bld: 81 mg/dL (ref 65–99)
Potassium: 4 mEq/L (ref 3.5–5.3)
Sodium: 137 mEq/L (ref 135–146)
Total Bilirubin: 0.4 mg/dL (ref 0.2–1.2)
Total Protein: 7 g/dL (ref 6.1–8.1)

## 2014-12-30 ENCOUNTER — Telehealth: Payer: Self-pay | Admitting: Internal Medicine

## 2014-12-30 NOTE — Telephone Encounter (Signed)
Please call pt back.

## 2015-01-05 NOTE — Telephone Encounter (Signed)
Tried to call pt, rang andf rang, no answer, no vmail

## 2015-02-18 ENCOUNTER — Other Ambulatory Visit: Payer: Self-pay | Admitting: Internal Medicine

## 2015-03-21 ENCOUNTER — Other Ambulatory Visit: Payer: Self-pay | Admitting: Internal Medicine

## 2015-03-30 ENCOUNTER — Encounter: Payer: Self-pay | Admitting: Student

## 2015-05-21 ENCOUNTER — Other Ambulatory Visit: Payer: Self-pay | Admitting: Internal Medicine

## 2015-05-21 NOTE — Telephone Encounter (Signed)
Pt has appt 3/20 dr Tiburcio Pea

## 2015-05-21 NOTE — Telephone Encounter (Signed)
Hello, Pt has not been seen in clinic since July She needs appt for her hypertension, therefore I am not refilling the lisinopril long term as it has been 6 months- giving her a 1 month supply   It would be best if we can schedule her this month sooner than the march 20th appointment specifically for her hypertension    Thanks Breuna Loveall

## 2015-06-21 ENCOUNTER — Other Ambulatory Visit: Payer: Self-pay | Admitting: Internal Medicine

## 2015-06-21 NOTE — Telephone Encounter (Signed)
Hi Pt not seen since July So not refilling the med- pl ask the pt to make an appt   Thanks Rochelle Larue

## 2015-06-21 NOTE — Telephone Encounter (Signed)
Message sent to front desk to make appointment

## 2015-06-23 ENCOUNTER — Other Ambulatory Visit: Payer: Self-pay | Admitting: Internal Medicine

## 2015-06-23 NOTE — Telephone Encounter (Signed)
Pt requesting lisinopril to be filled @ [facility].

## 2015-06-23 NOTE — Telephone Encounter (Signed)
Appointment has been made

## 2015-06-23 NOTE — Telephone Encounter (Signed)
Tried to call but got VM- LVM for patient.  She has to be seen prior to refill per PCP.  She has appointment for march so if cannot come sooner we need to discuss with PCP

## 2015-06-25 ENCOUNTER — Telehealth: Payer: Self-pay | Admitting: Internal Medicine

## 2015-06-25 NOTE — Telephone Encounter (Signed)
APPT REMINDER CALL, LMTCB IF SHE NEEDS TO CANCEL °

## 2015-06-28 ENCOUNTER — Ambulatory Visit: Payer: Medicaid Other | Admitting: Internal Medicine

## 2015-07-02 ENCOUNTER — Ambulatory Visit (INDEPENDENT_AMBULATORY_CARE_PROVIDER_SITE_OTHER): Payer: Medicaid Other | Admitting: Internal Medicine

## 2015-07-02 ENCOUNTER — Encounter: Payer: Self-pay | Admitting: Internal Medicine

## 2015-07-02 VITALS — BP 168/77 | HR 81 | Temp 98.2°F | Wt 166.7 lb

## 2015-07-02 DIAGNOSIS — Z1231 Encounter for screening mammogram for malignant neoplasm of breast: Secondary | ICD-10-CM

## 2015-07-02 DIAGNOSIS — I10 Essential (primary) hypertension: Secondary | ICD-10-CM | POA: Diagnosis present

## 2015-07-02 DIAGNOSIS — F418 Other specified anxiety disorders: Secondary | ICD-10-CM

## 2015-07-02 DIAGNOSIS — G629 Polyneuropathy, unspecified: Secondary | ICD-10-CM | POA: Diagnosis not present

## 2015-07-02 DIAGNOSIS — G5793 Unspecified mononeuropathy of bilateral lower limbs: Secondary | ICD-10-CM | POA: Insufficient documentation

## 2015-07-02 LAB — POCT GLYCOSYLATED HEMOGLOBIN (HGB A1C): Hemoglobin A1C: 5.5

## 2015-07-02 LAB — GLUCOSE, CAPILLARY: Glucose-Capillary: 89 mg/dL (ref 65–99)

## 2015-07-02 MED ORDER — LISINOPRIL 20 MG PO TABS: 20.0000 mg | ORAL_TABLET | Freq: Every day | ORAL | Status: DC

## 2015-07-02 MED ORDER — GABAPENTIN 300 MG PO CAPS: 300.0000 mg | ORAL_CAPSULE | Freq: Every day | ORAL | Status: DC

## 2015-07-02 MED ORDER — FLUOXETINE HCL 40 MG PO CAPS: 40.0000 mg | ORAL_CAPSULE | Freq: Every day | ORAL | Status: DC

## 2015-07-02 MED ORDER — HYDROCHLOROTHIAZIDE 12.5 MG PO CAPS: 12.5000 mg | ORAL_CAPSULE | Freq: Every day | ORAL | Status: DC

## 2015-07-02 NOTE — Patient Instructions (Signed)
1. Please make a follow up appointment for 4 weeks.   2. Please take all medications as previously prescribed with the following changes:  Increase Prozac to 40 mg daily. A new prescription was sent to you pharmacy.   Start taking Lisinopril 20 mg daily as you were before. Also start taking HCTZ 12.5 mg daily for blood pressure.  Start taking Neurontin 300 mg once at night. This will help with your foot pain and sleep.  We will set up your mammogram.   DO NOT MISS YOUR APPOINTMENT IN 1 MONTH!!  3. If you have worsening of your symptoms or new symptoms arise, please call the clinic [phone]), or go to the ER immediately if symptoms are severe.

## 2015-07-02 NOTE — Progress Notes (Signed)
Subjective:   Patient ID: Lori [name] female   DOB: February 21, 1956 60 y.o.   MRN: [id number]  HPI: [name] is a 60 y.o. female w/ PMHx of HTN, GERD, depression, anxiety, remote h/o Cervical CA, presents to the clinic today for a follow-up visit regarding her BP. Patient's biggest complaint is that of continued depression despite being on Prozac 20 mg daily. She says this helps quite a bit but feels that she continues to have symptoms of overwhelming sadness, anxiety, fatigue, and continued insomnia, the latter of which seems to be mostly related to a bilateral foot pain. She describes this pain as a burning sensation, on the bottom of her feet, with associated numbness and tingling as well as throbbing most significant at night. No back pain. Says this pain extends up into her legs. No weakness, balance issues, confusion, dizziness, hearing disturbance, polyuria, or weight changes.   Past Medical History  Diagnosis Date  . Hypertension 2007    Admitted for HTN crisis in 10/2008. Was given in 2010 a wrong prescription from the pharmacy and per ED note it was Lisinopril-Hydrocholorthizide which gave her a  hives and feeling of sickness. Patient was started  on this meds on 05/31/2010 by Dr  Ihor Gully without any problem.   . Depression with anxiety 2010    Patient had multiple death in her family over a short period of time. father, brother, uncle and niece who was stabbed 3 years ago. Patient's husband had a car accident last year and  need currently a feeding tube.   . Insomnia   . History of cervical cancer  1985     status post partial hysterectomy , last Pap smear 10 years ago , no further followup  . Tobacco abuse      35 years  . GERD (gastroesophageal reflux disease)   . History of cocaine abuse 10/23/2008     last documented in 2007 when admitted for hypertensive urgency  . Arthritis   . Depression   . Headache(784.0)   . Cancer    Current Outpatient Prescriptions  Medication  Sig Dispense Refill  . diphenhydramine-acetaminophen (TYLENOL PM) 25-500 MG TABS Take 1-2 tablets by mouth at bedtime as needed (sleep).    Marland Kitchen FLUoxetine (PROZAC) 20 MG capsule TAKE ONE CAPSULE EVERY DAY 30 capsule 3  . fluticasone (FLONASE) 50 MCG/ACT nasal spray Place 2 sprays into both nostrils daily. 16 g 2  . HYDROcodone-acetaminophen (NORCO/VICODIN) 5-325 MG per tablet Take 0.5-1 tablets by mouth every 6 (six) hours as needed for moderate pain or severe pain.    Marland Kitchen lisinopril (PRINIVIL,ZESTRIL) 20 MG tablet TAKE 1 TABLET (20 MG TOTAL) BY MOUTH DAILY. 30 tablet 0  . loratadine (CLARITIN) 10 MG tablet Take 1 tablet (10 mg total) by mouth daily. 30 tablet 2   No current facility-administered medications for this visit.    Review of Systems: General: Positive for fatigue. Denies fever, chills, diaphoresis, appetite change.  Respiratory: Denies SOB, DOE, cough, and wheezing.   Cardiovascular: Denies chest pain and palpitations.  Gastrointestinal: Denies nausea, vomiting, abdominal pain, and diarrhea.  Genitourinary: Denies dysuria, increased frequency, and flank pain. Endocrine: Denies hot or cold intolerance, polyuria, and polydipsia. Musculoskeletal: Positive for bilateral foot pain. Denies back pain, joint swelling, and gait problem.  Skin: Denies pallor, rash and wounds.  Neurological: Denies dizziness, seizures, syncope, weakness, lightheadedness, numbness and headaches.  Psychiatric/Behavioral: Positive for depression and insomnia.    Objective:   Physical Exam: Filed Vitals:  07/02/15 1409  BP: 168/77  Pulse: 81  Temp: 98.2 F (36.8 C)  TempSrc: Oral  Weight: 166 lb 11.2 oz (75.615 kg)  SpO2: 100%    General: Mildly anxious female, alert, cooperative. HEENT: PERRL, EOMI. Moist mucus membranes Neck: Full range of motion without pain, supple, no lymphadenopathy or carotid bruits Lungs: Clear to ascultation bilaterally, normal work of respiration, no wheezes, rales,  rhonchi Heart: RRR, no murmurs, gallops, or rubs Abdomen: Soft, non-tender, non-distended, BS + Extremities: No cyanosis, clubbing, or edema. Pain with palpation of feet bilaterally, superficial.  Neurologic: Alert & oriented X3, cranial nerves II-XII intact, strength grossly intact, sensation intact to light touch   Assessment & Plan:   Please see problem based assessment and plan.

## 2015-07-03 LAB — BMP8+ANION GAP
Anion Gap: 19 mmol/L — ABNORMAL HIGH (ref 10.0–18.0)
BUN/Creatinine Ratio: 8 — ABNORMAL LOW (ref 9–23)
BUN: 10 mg/dL (ref 6–24)
CO2: 19 mmol/L (ref 18–29)
Calcium: 9.7 mg/dL (ref 8.7–10.2)
Chloride: 100 mmol/L (ref 96–106)
Creatinine, Ser: 1.2 mg/dL — ABNORMAL HIGH (ref 0.57–1.00)
GFR calc Af Amer: 58 mL/min/{1.73_m2} — ABNORMAL LOW (ref >59)
GFR calc non Af Amer: 50 mL/min/{1.73_m2} — ABNORMAL LOW (ref >59)
Glucose: 85 mg/dL (ref 65–99)
Potassium: 4.4 mmol/L (ref 3.5–5.2)
Sodium: 138 mmol/L (ref 134–144)

## 2015-07-03 LAB — VITAMIN B12: Vitamin B-12: 432 pg/mL (ref 211–946)

## 2015-07-05 NOTE — Progress Notes (Signed)
Internal Medicine Clinic Attending  Case discussed with Dr. Yuhasz at the time of the visit.  We reviewed the resident's history and exam and pertinent patient test results.  I agree with the assessment, diagnosis, and plan of care documented in the resident's note.  

## 2015-07-05 NOTE — Assessment & Plan Note (Signed)
Scheduled patient for mammo

## 2015-07-05 NOTE — Assessment & Plan Note (Signed)
Patient describes burning, numbness, tingling, and throbbing of both feet, extending up her legs into the calves bilaterally, mostly at night when she is trying to go to sleep. No obvious risk factors for neuropathy, previous HbA1c normal, B12 normal in the past. Does not seem severely malnourished, has no other significant symptoms such as weakness, skin wounds, incontinence, etc. Suspect this may be related to her depression, however, seems clearly like a neuropathic type pain. Repeat HbA1c and B12 normal.  -Increase Prozac to 40 mg daily -Trial of Neurontin 300 mg qhs for neuropathic pain most significant at bedtime.

## 2015-07-05 NOTE — Assessment & Plan Note (Signed)
Still describes symptoms of overwhelming sadness, fatigue, insomnia, etc. Says when she was started on Prozac it helped very much.  -Increase Prozac to 40 mg daily.  -RTC in 4 weeks

## 2015-07-05 NOTE — Assessment & Plan Note (Signed)
BP Readings from Last 3 Encounters:  07/02/15 168/77  11/27/14 179/75  04/10/14 182/78    Lab Results  Component Value Date   NA 138 07/02/2015   K 4.4 07/02/2015   CREATININE 1.20* 07/02/2015    Assessment: Blood pressure control:  Elevated Comments: Patient takes Lisinopril 20 mg daily, has missed her dose today. States she takes it otherwise.   Plan: Medications:  Continue Lisinopril 20 mg daily. Start HCTZ 12.5 mg daily.  Other plans: Checked BMP, Cr 1.20, increased from previous, however, previous BMP was drawn prior to her starting ACEI, therefore this could be a transient increase in Cr due to ACEI therapy. Would recheck BMP at next clinic visit given the new addition of HCTZ to determine if her Cr is stable. If not, would discontinue and utilize a CCB instead. RTC in 4 weeks.

## 2015-07-19 ENCOUNTER — Ambulatory Visit
Admission: RE | Admit: 2015-07-19 | Discharge: 2015-07-19 | Disposition: A | Discharge status: Home / Self Care | Payer: Medicaid Other | Source: Ambulatory Visit | Attending: Internal Medicine | Admitting: Internal Medicine

## 2015-07-19 DIAGNOSIS — Z1231 Encounter for screening mammogram for malignant neoplasm of breast: Secondary | ICD-10-CM

## 2015-07-26 ENCOUNTER — Encounter: Payer: Medicaid Other | Admitting: Internal Medicine

## 2015-10-01 ENCOUNTER — Other Ambulatory Visit: Payer: Self-pay

## 2015-10-01 DIAGNOSIS — G5793 Unspecified mononeuropathy of bilateral lower limbs: Secondary | ICD-10-CM

## 2015-10-01 MED ORDER — GABAPENTIN 300 MG PO CAPS: 300.0000 mg | ORAL_CAPSULE | Freq: Every day | ORAL | Status: DC

## 2015-10-01 NOTE — Telephone Encounter (Signed)
Lori [name] from [facility] requesting gabapentin to be filled.

## 2015-12-13 ENCOUNTER — Encounter: Payer: Medicaid Other | Admitting: Internal Medicine

## 2015-12-13 ENCOUNTER — Encounter: Payer: Self-pay | Admitting: Internal Medicine

## 2015-12-27 ENCOUNTER — Encounter: Payer: Medicaid Other | Admitting: Internal Medicine

## 2016-01-02 ENCOUNTER — Emergency Department (HOSPITAL_COMMUNITY)
Admission: EM | Admit: 2016-01-02 | Discharge: 2016-01-02 | Disposition: A | Discharge status: Home / Self Care | Payer: Medicaid Other | Attending: Emergency Medicine | Admitting: Emergency Medicine

## 2016-01-02 ENCOUNTER — Encounter (HOSPITAL_COMMUNITY): Payer: Self-pay | Admitting: Emergency Medicine

## 2016-01-02 DIAGNOSIS — Z8541 Personal history of malignant neoplasm of cervix uteri: Secondary | ICD-10-CM | POA: Insufficient documentation

## 2016-01-02 DIAGNOSIS — B3731 Acute candidiasis of vulva and vagina: Secondary | ICD-10-CM

## 2016-01-02 DIAGNOSIS — I10 Essential (primary) hypertension: Secondary | ICD-10-CM | POA: Diagnosis not present

## 2016-01-02 DIAGNOSIS — F17210 Nicotine dependence, cigarettes, uncomplicated: Secondary | ICD-10-CM | POA: Diagnosis not present

## 2016-01-02 DIAGNOSIS — Z760 Encounter for issue of repeat prescription: Secondary | ICD-10-CM

## 2016-01-02 DIAGNOSIS — B373 Candidiasis of vulva and vagina: Secondary | ICD-10-CM | POA: Insufficient documentation

## 2016-01-02 DIAGNOSIS — R300 Dysuria: Secondary | ICD-10-CM | POA: Diagnosis present

## 2016-01-02 LAB — URINALYSIS, ROUTINE W REFLEX MICROSCOPIC
Bilirubin Urine: NEGATIVE
Glucose, UA: NEGATIVE mg/dL
Hgb urine dipstick: NEGATIVE
Ketones, ur: NEGATIVE mg/dL
Nitrite: NEGATIVE
Protein, ur: NEGATIVE mg/dL
Specific Gravity, Urine: 1.004 — ABNORMAL LOW (ref 1.005–1.030)
pH: 7.5 (ref 5.0–8.0)

## 2016-01-02 LAB — CBC
HCT: 31 % — ABNORMAL LOW (ref 36.0–46.0)
Hemoglobin: 10.1 g/dL — ABNORMAL LOW (ref 12.0–15.0)
MCH: 33.8 pg (ref 26.0–34.0)
MCHC: 32.6 g/dL (ref 30.0–36.0)
MCV: 103.7 fL — ABNORMAL HIGH (ref 78.0–100.0)
Platelets: 487 10*3/uL — ABNORMAL HIGH (ref 150–400)
RBC: 2.99 MIL/uL — ABNORMAL LOW (ref 3.87–5.11)
RDW: 13.2 % (ref 11.5–15.5)
WBC: 7.2 10*3/uL (ref 4.0–10.5)

## 2016-01-02 LAB — COMPREHENSIVE METABOLIC PANEL
ALT: 17 U/L (ref 14–54)
AST: 34 U/L (ref 15–41)
Albumin: 3.3 g/dL — ABNORMAL LOW (ref 3.5–5.0)
Alkaline Phosphatase: 110 U/L (ref 38–126)
Anion gap: 9 (ref 5–15)
BUN: 6 mg/dL (ref 6–20)
CO2: 24 mmol/L (ref 22–32)
Calcium: 9.2 mg/dL (ref 8.9–10.3)
Chloride: 103 mmol/L (ref 101–111)
Creatinine, Ser: 0.85 mg/dL (ref 0.44–1.00)
GFR calc Af Amer: >60 mL/min (ref >60)
GFR calc non Af Amer: >60 mL/min (ref >60)
Glucose, Bld: 105 mg/dL — ABNORMAL HIGH (ref 65–99)
Potassium: 4.9 mmol/L (ref 3.5–5.1)
Sodium: 136 mmol/L (ref 135–145)
Total Bilirubin: 1.3 mg/dL — ABNORMAL HIGH (ref 0.3–1.2)
Total Protein: 6.6 g/dL (ref 6.5–8.1)

## 2016-01-02 LAB — URINE MICROSCOPIC-ADD ON
Bacteria, UA: NONE SEEN
RBC / HPF: NONE SEEN RBC/hpf (ref 0–5)

## 2016-01-02 LAB — LIPASE, BLOOD: Lipase: 26 U/L (ref 11–51)

## 2016-01-02 MED ORDER — SACCHAROMYCES BOULARDII 250 MG PO CAPS
250.0000 mg | ORAL_CAPSULE | Freq: Two times a day (BID) | ORAL | 0 refills | Status: DC
Start: 2016-01-02 — End: 2016-09-12

## 2016-01-02 MED ORDER — CLOTRIMAZOLE 1 % EX CREA: TOPICAL_CREAM | CUTANEOUS | 0 refills | Status: DC

## 2016-01-02 MED ORDER — CLINDAMYCIN HCL 150 MG PO CAPS: 300.0000 mg | ORAL_CAPSULE | Freq: Four times a day (QID) | ORAL | 0 refills | Status: AC

## 2016-01-02 MED ORDER — ONDANSETRON HCL 4 MG PO TABS: 4.0000 mg | ORAL_TABLET | Freq: Four times a day (QID) | ORAL | 0 refills | Status: DC

## 2016-01-02 MED ORDER — NICOTINE 14 MG/24HR TD PT24: 14.0000 mg | MEDICATED_PATCH | Freq: Every day | TRANSDERMAL | 0 refills | Status: DC

## 2016-01-02 NOTE — ED Notes (Signed)
Pt states that she was just d/c'd from a hospital in [place] on Friday -- hx of cellulitis in perineal area, has a "lump the size of a pear" in suprapubic area. Started running a fever this am again, has been taking meds.  Also c/o severe headache- asking for tylenol.

## 2016-01-02 NOTE — ED Provider Notes (Signed)
[facility] DEPT Provider Note   CSN: [id number] Arrival date & time: 01/02/16  1233     History   Chief Complaint Chief Complaint  Patient presents with  . Cellulitis  . Anemia  . Migraine  . Dysuria    HPI Lori [name] is a 60 y.o. female.  HPI Pt was recently in the hospital 1 week ago.  She was treated for a cellulitis and diagnosed with anemia.  She was treated in a hospital in [place].  Pt states she was on abx in the ICU.  Her Hgb had dropped down to 7 and increased to 8 before she left.  Pt is taking clindamycin now.  Today she woke up and had a headache.  She also started having pain in her lower abdominal area.  It was cramping.  She had some pain with urination.  She was told if she had new symptoms to come to the hospital.  She is also having a headache.  She also noticed a rash in the vaginal area.  She is worried that the infection is coming back.  No fevers.  No vomiting.   Past Medical History:  Diagnosis Date  . Arthritis   . Cancer ([place])   . Depression   . Depression with anxiety 08/11/2008   Patient had multiple death in her family over a short period of time. father, brother, uncle and niece who was stabbed 3 years ago. Patient's husband had a car accident last year and  need currently a feeding tube.   Marland Kitchen GERD (gastroesophageal reflux disease)   . Headache(784.0)   . History of cervical cancer  1985    status post partial hysterectomy , last Pap smear 10 years ago , no further followup  . History of cocaine abuse 10/23/2008    last documented in 2005/08/11 when admitted for hypertensive urgency  . Hypertension 08/11/05   Admitted for HTN crisis in 10/2008. Was given in 08-11-08 a wrong prescription from the pharmacy and per ED note it was Lisinopril-Hydrocholorthizide which gave her a  hives and feeling of sickness. Patient was started  on this meds on 05/31/2010 by Dr  Ihor Gully without any problem.   . Insomnia   . Tobacco abuse     35 years    Patient Active  Problem List   Diagnosis Date Noted  . Encounter for screening mammogram for breast cancer 07/02/2015  . 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 Allergic rhinitis 11/27/2014  . Otitis media of right ear 07/01/2012  . Cough 11/20/2011  . Renal lesion 06/12/2011  . Nausea 05/10/2011  . Caries 04/12/2011  . Abdominal pain 10/25/2010  . Preventive measure 10/25/2010  . Shoulder pain, left 10/25/2010  . Leg pain 06/20/2010  . CERVICAL CANCER 10/23/2008  . TOBACCO ABUSE 10/23/2008  . Depression with anxiety 10/23/2008  . Essential hypertension 10/23/2008  . GERD 10/23/2008  . Insomnia 10/23/2008  . SNORING 10/23/2008    Past Surgical History:  Procedure Laterality Date  . ABDOMINAL HYSTERECTOMY    . CESAREAN SECTION  1985  . SKIN GRAFT      OB History    No data available       Home Medications    Prior to Admission medications   Medication Sig Start Date End Date Taking? Authorizing Provider  clindamycin (CLEOCIN) 150 MG capsule Take 2 capsules (300 mg total) by mouth every 6 (six) hours. 01/02/16 01/07/16  Dorie Rank, MD  clotrimazole (LOTRIMIN) 1 % cream Apply  to affected area 2 times daily 01/02/16   Dorie Rank, MD  diphenhydramine-acetaminophen (TYLENOL PM) 25-500 MG TABS Take 1-2 tablets by mouth at bedtime as needed (sleep).    Historical Provider, MD  FLUoxetine (PROZAC) 40 MG capsule Take 1 capsule (40 mg total) by mouth daily. 07/02/15   Corky Sox, MD  fluticasone (FLONASE) 50 MCG/ACT nasal spray Place 2 sprays into both nostrils daily. 11/27/14 11/27/15  Burgess Estelle, MD  gabapentin (NEURONTIN) 300 MG capsule Take 1 capsule (300 mg total) by mouth at bedtime. 10/01/15 09/30/16  Burgess Estelle, MD  hydrochlorothiazide (MICROZIDE) 12.5 MG capsule Take 1 capsule (12.5 mg total) by mouth daily. 07/02/15 07/01/16  Corky Sox, MD  lisinopril (PRINIVIL,ZESTRIL) 20 MG tablet Take 1 tablet (20 mg total) by mouth daily. 07/02/15   Corky Sox, MD  loratadine (CLARITIN) 10 MG  tablet Take 1 tablet (10 mg total) by mouth daily. 11/27/14 11/27/15  Burgess Estelle, MD  nicotine (NICODERM CQ - DOSED IN MG/24 HOURS) 14 mg/24hr patch Place 1 patch (14 mg total) onto the skin daily. 01/02/16   Dorie Rank, MD  ondansetron (ZOFRAN) 4 MG tablet Take 1 tablet (4 mg total) by mouth every 6 (six) hours. 01/02/16   Dorie Rank, MD  saccharomyces boulardii (FLORASTOR) 250 MG capsule Take 1 capsule (250 mg total) by mouth 2 (two) times daily. 01/02/16   Dorie Rank, MD    Family History Family History  Problem Relation Age of Onset  . Thyroid disease Mother   . Diabetes Father   . Heart disease Father   . Hyperlipidemia Father   . Hypertension Father   . Diabetes Brother   . Hypertension Brother   . Hyperlipidemia Brother   . Colon polyps Brother   . Thyroid disease Daughter   . Obesity Daughter   . Mental illness Daughter   . Colon cancer Maternal Uncle     Social History Social History  Substance Use Topics  . Smoking status: Current Some Day Smoker    Packs/day: 0.50    Years: 43.00    Types: Cigarettes  . Smokeless tobacco: Never Used     Comment: patches have helped in the past  . Alcohol use No     Allergies   Review of patient's allergies indicates no known allergies.   Review of Systems Review of Systems  All other systems reviewed and are negative.    Physical Exam Updated Vital Signs BP 157/83 (BP Location: Left Arm)   Pulse 72   Temp 98.6 F (37 C) (Oral)   Resp 22   Ht 5' 2.5" (1.588 m)   Wt 77.1 kg   SpO2 98%   BMI 30.60 kg/m   Physical Exam  Constitutional: She appears well-developed and well-nourished. No distress.  HENT:  Head: Normocephalic and atraumatic.  Right Ear: External ear normal.  Left Ear: External ear normal.  Eyes: Conjunctivae are normal. Right eye exhibits no discharge. Left eye exhibits no discharge. No scleral icterus.  Neck: Neck supple. No tracheal deviation present.  Cardiovascular: Normal rate, regular rhythm and  intact distal pulses.   Pulmonary/Chest: Effort normal and breath sounds normal. No stridor. No respiratory distress. She has no wheezes. She has no rales.  Abdominal: Soft. Bowel sounds are normal. She exhibits no distension. There is no tenderness. There is no rebound and no guarding.  Genitourinary:  Genitourinary Comments: Erythematous rash , in the perineum, well demarcated border, satellite lesions  Musculoskeletal: She exhibits no edema  or tenderness.  Neurological: She is alert. She has normal strength. No cranial nerve deficit (no facial droop, extraocular movements intact, no slurred speech) or sensory deficit. She exhibits normal muscle tone. She displays no seizure activity. Coordination normal.  Skin: Skin is warm and dry. No rash noted.  Psychiatric: She has a normal mood and affect.  Nursing note and vitals reviewed.    ED Treatments / Results  Labs (all labs ordered are listed, but only abnormal results are displayed) Labs Reviewed  COMPREHENSIVE METABOLIC PANEL - Abnormal; Notable for the following:       Result Value   Glucose, Bld 105 (*)    Albumin 3.3 (*)    Total Bilirubin 1.3 (*)    All other components within normal limits  CBC - Abnormal; Notable for the following:    RBC 2.99 (*)    Hemoglobin 10.1 (*)    HCT 31.0 (*)    MCV 103.7 (*)    Platelets 487 (*)    All other components within normal limits  URINALYSIS, ROUTINE W REFLEX MICROSCOPIC (NOT AT [facility]) - Abnormal; Notable for the following:    Specific Gravity, Urine 1.004 (*)    Leukocytes, UA SMALL (*)    All other components within normal limits  URINE MICROSCOPIC-ADD ON - Abnormal; Notable for the following:    Squamous Epithelial / LPF 6-30 (*)    All other components within normal limits  LIPASE, BLOOD   Procedures Procedures (including critical care time)   Initial Impression / Assessment and Plan / ED Course  I have reviewed the triage vital signs and the nursing notes.  Pertinent labs  & imaging results that were available during my care of the patient were reviewed by me and considered in my medical decision making (see chart for details).  Clinical Course  Value Comment By Time   Hgb decreased from 1 year ago 13.0 Dorie Rank, MD 08/27 1600  Ketones, ur: NEGATIVE (Reviewed) Dorie Rank, MD 08/27 1601   Labs reviewed.  No uti. Hgb is increasing from her hospitalization.  CMET normal Dorie Rank, MD 08/27 1619    Pt does not have evidence of cellulitis on exam.  Labs are reassuring.  No fevers.  Vitals stable.  Does not appear to have return of infection.  Pt is concerned because she will need new prescriptions.  Pharmacy wont fill them because they were written by a doctor in another state according to the patient.  Will also give her rx for clotimazole.  Yeast infection induced by her abx  Final Clinical Impressions(s) / ED Diagnoses   Final diagnoses:  Vaginal yeast infection  Medication refill    New Prescriptions New Prescriptions   CLINDAMYCIN (CLEOCIN) 150 MG CAPSULE    Take 2 capsules (300 mg total) by mouth every 6 (six) hours.   CLOTRIMAZOLE (LOTRIMIN) 1 % CREAM    Apply to affected area 2 times daily   NICOTINE (NICODERM CQ - DOSED IN MG/24 HOURS) 14 MG/24HR PATCH    Place 1 patch (14 mg total) onto the skin daily.   ONDANSETRON (ZOFRAN) 4 MG TABLET    Take 1 tablet (4 mg total) by mouth every 6 (six) hours.   SACCHAROMYCES BOULARDII (FLORASTOR) 250 MG CAPSULE    Take 1 capsule (250 mg total) by mouth 2 (two) times daily.     Dorie Rank, MD 01/02/16 [phone]

## 2016-01-02 NOTE — Discharge Instructions (Signed)
Follow up with your primary care doctor, take the medications as you were previously instructed, use the cream for the rash

## 2016-01-05 ENCOUNTER — Other Ambulatory Visit: Payer: Self-pay | Admitting: *Deleted

## 2016-01-05 ENCOUNTER — Other Ambulatory Visit: Payer: Self-pay | Admitting: Internal Medicine

## 2016-01-05 ENCOUNTER — Encounter: Payer: Self-pay | Admitting: Internal Medicine

## 2016-01-05 ENCOUNTER — Ambulatory Visit (INDEPENDENT_AMBULATORY_CARE_PROVIDER_SITE_OTHER): Payer: Medicaid Other | Admitting: Internal Medicine

## 2016-01-05 DIAGNOSIS — I10 Essential (primary) hypertension: Secondary | ICD-10-CM

## 2016-01-05 DIAGNOSIS — B3749 Other urogenital candidiasis: Secondary | ICD-10-CM

## 2016-01-05 DIAGNOSIS — Z09 Encounter for follow-up examination after completed treatment for conditions other than malignant neoplasm: Secondary | ICD-10-CM

## 2016-01-05 DIAGNOSIS — L03314 Cellulitis of groin: Secondary | ICD-10-CM | POA: Insufficient documentation

## 2016-01-05 DIAGNOSIS — F418 Other specified anxiety disorders: Secondary | ICD-10-CM

## 2016-01-05 DIAGNOSIS — Z872 Personal history of diseases of the skin and subcutaneous tissue: Secondary | ICD-10-CM | POA: Diagnosis not present

## 2016-01-05 DIAGNOSIS — G5793 Unspecified mononeuropathy of bilateral lower limbs: Secondary | ICD-10-CM

## 2016-01-05 MED ORDER — FLUCONAZOLE 150 MG PO TABS: 150.0000 mg | ORAL_TABLET | ORAL | 0 refills | Status: DC

## 2016-01-05 MED ORDER — FLUOXETINE HCL 40 MG PO CAPS: 40.0000 mg | ORAL_CAPSULE | Freq: Every day | ORAL | 4 refills | Status: DC

## 2016-01-05 MED ORDER — LISINOPRIL 20 MG PO TABS: 20.0000 mg | ORAL_TABLET | Freq: Every day | ORAL | 4 refills | Status: DC

## 2016-01-05 NOTE — Assessment & Plan Note (Signed)
Patient is here for follow up from hospital for cellulitis. She said that she had a rash in her groin which got worse during the middle of the month, and she was admitted in a hospital in [place] for cellulitis and stayed in the hospital for about 7 days where she received antibiotics. She was started on oral clindamycin on August 19th and has continued taking it since. She says that her rash in left groin is much better after being on antibiotics. She has experienced some diarrhea, and candidiasis for which clotrimazole cream has not been effective. On exam, no rash seen , except perineal lesions consistent with candidiasis. There was also a reactive lymph node? Vs adnexal mass on left LQ.  Assessment: cellulitis of groin, now resolved, and perineal candidiasis resulting from abx rx  Plan -asked her to finish abx course -prescribed diflucan 150 mg QOD for 3 doses -follow up on the reactive lymph node in LLQ vs adnexal mass -obtain outside records

## 2016-01-05 NOTE — Patient Instructions (Signed)
Thank you for your visit today  Please take the diflucan medicine every other day for 3 doses  Please finish your antibiotics  Please follow up in 1 week

## 2016-01-05 NOTE — Progress Notes (Signed)
   CC: ER follow up for cellulitis HPI: [name] is a 60 y.o. woman with PMH noted below here for ER follow up of her cellulitis   Please see Problem List/A&P for the status of the patient's chronic medical problems   Past Medical History:  Diagnosis Date  . Arthritis   . Cancer ([place])   . Depression   . Depression with anxiety 14-Aug-2008   Patient had multiple death in her family over a short period of time. father, brother, uncle and niece who was stabbed 3 years ago. Patient's husband had a car accident last year and  need currently a feeding tube.   Marland Kitchen GERD (gastroesophageal reflux disease)   . Headache(784.0)   . History of cervical cancer  1985    status post partial hysterectomy , last Pap smear 10 years ago , no further followup  . History of cocaine abuse 10/23/2008    last documented in 14-Aug-2005 when admitted for hypertensive urgency  . Hypertension August 14, 2005   Admitted for HTN crisis in 10/2008. Was given in 08-14-08 a wrong prescription from the pharmacy and per ED note it was Lisinopril-Hydrocholorthizide which gave her a  hives and feeling of sickness. Patient was started  on this meds on 05/31/2010 by Dr  Ihor Gully without any problem.   . Insomnia   . Tobacco abuse     35 years    Review of Systems: Denies fevers, chills, fatigue Denies n/v/abd pain. Has some diarrhea but on clindamycin.  Says rash had improved Has some tenderness in left groin  Denies hematochezia or melena   Physical Exam: Vitals:   01/05/16 1503  BP: 140/68  Pulse: 77  Temp: 98.2 F (36.8 C)  TempSrc: Oral  SpO2: 100%  Weight: 168 lb (76.2 kg)  Height: 5' 2.5" (1.588 m)    General: A&O, in NAD CV: RRR, normal s1, s2, no m/r/g,  Resp: equal and symmetric breath sounds, no wheezing or crackles  Abdomen: soft, nontender, nondistended, +BS GU: satellite lesions around perineum and vulva          Has a reactive lymph node in left adnexa/LLQ       No rash anywhere else   Assessment & Plan:    See encounters tab for problem based medical decision making. Patient seen with Dr. Evette Doffing

## 2016-01-06 NOTE — Progress Notes (Signed)
Internal Medicine Clinic Attending  I saw and evaluated the patient.  I personally confirmed the key portions of the history and exam documented by Dr. Saraiya and I reviewed pertinent patient test results.  The assessment, diagnosis, and plan were formulated together and I agree with the documentation in the resident's note.  

## 2016-01-12 ENCOUNTER — Encounter: Payer: Self-pay | Admitting: Internal Medicine

## 2016-01-12 ENCOUNTER — Ambulatory Visit: Payer: Medicaid Other

## 2016-01-24 ENCOUNTER — Encounter: Payer: Medicaid Other | Admitting: Internal Medicine

## 2016-02-21 ENCOUNTER — Other Ambulatory Visit: Payer: Self-pay | Admitting: Internal Medicine

## 2016-02-21 NOTE — Telephone Encounter (Signed)
appt 10/30 w/pcp

## 2016-03-03 ENCOUNTER — Telehealth: Payer: Self-pay | Admitting: Internal Medicine

## 2016-03-03 NOTE — Telephone Encounter (Signed)
APT. REMINDER CALL, LMTCB °

## 2016-03-06 ENCOUNTER — Encounter: Payer: Medicaid Other | Admitting: Internal Medicine

## 2016-04-03 ENCOUNTER — Other Ambulatory Visit: Payer: Self-pay | Admitting: Internal Medicine

## 2016-04-03 DIAGNOSIS — G5793 Unspecified mononeuropathy of bilateral lower limbs: Secondary | ICD-10-CM

## 2016-06-03 ENCOUNTER — Telehealth: Payer: Self-pay | Admitting: Internal Medicine

## 2016-06-03 DIAGNOSIS — I10 Essential (primary) hypertension: Secondary | ICD-10-CM

## 2016-06-07 ENCOUNTER — Other Ambulatory Visit: Payer: Self-pay | Admitting: Internal Medicine

## 2016-06-07 DIAGNOSIS — F418 Other specified anxiety disorders: Secondary | ICD-10-CM

## 2016-06-07 NOTE — Telephone Encounter (Signed)
Pt needs appt Giving 1 month supply

## 2016-06-09 ENCOUNTER — Encounter: Payer: Self-pay | Admitting: Internal Medicine

## 2016-06-09 NOTE — Telephone Encounter (Signed)
I have dismissal letter. We must provide medically necessary refills and urgent appts for 30 days after receipt of letter.

## 2016-06-09 NOTE — Telephone Encounter (Signed)
Patient has 3 no show appts in the last year.  Has been sent 3 letters in connection with not keeping those appts.  Sending to Dr. Lynnae January to address due to [facility] current no show policy since patient is requesting refill on medicines.

## 2016-06-19 ENCOUNTER — Encounter: Payer: Self-pay | Admitting: Gastroenterology

## 2016-07-03 ENCOUNTER — Other Ambulatory Visit: Payer: Self-pay | Admitting: Internal Medicine

## 2016-07-03 DIAGNOSIS — Z1231 Encounter for screening mammogram for malignant neoplasm of breast: Secondary | ICD-10-CM

## 2016-07-07 ENCOUNTER — Telehealth: Payer: Self-pay | Admitting: Internal Medicine

## 2016-07-07 ENCOUNTER — Other Ambulatory Visit: Payer: Self-pay | Admitting: Internal Medicine

## 2016-07-07 DIAGNOSIS — G5793 Unspecified mononeuropathy of bilateral lower limbs: Secondary | ICD-10-CM

## 2016-07-07 DIAGNOSIS — I10 Essential (primary) hypertension: Secondary | ICD-10-CM

## 2016-07-07 NOTE — Telephone Encounter (Signed)
Pt needs an appt thanks

## 2016-07-10 NOTE — Telephone Encounter (Signed)
Patient has been dismissed from practice due to multiple no shows.  Please refer to letter on 06/09/16 from Dr. Lynnae January.  Forwarding this message also to  Dr. Lynnae January.  Cert mail receipt has not been returned from patient.  Sending copy by regular mail with medical release form attached and removing pcp.

## 2016-07-24 ENCOUNTER — Ambulatory Visit: Payer: Medicaid Other

## 2016-07-31 ENCOUNTER — Ambulatory Visit
Admission: RE | Admit: 2016-07-31 | Discharge: 2016-07-31 | Disposition: A | Discharge status: Home / Self Care | Payer: Medicaid Other | Source: Ambulatory Visit | Attending: Internal Medicine | Admitting: Internal Medicine

## 2016-07-31 DIAGNOSIS — Z1231 Encounter for screening mammogram for malignant neoplasm of breast: Secondary | ICD-10-CM

## 2016-08-02 ENCOUNTER — Other Ambulatory Visit: Payer: Self-pay | Admitting: Internal Medicine

## 2016-08-02 DIAGNOSIS — R928 Other abnormal and inconclusive findings on diagnostic imaging of breast: Secondary | ICD-10-CM

## 2016-08-03 ENCOUNTER — Other Ambulatory Visit: Payer: Self-pay | Admitting: Internal Medicine

## 2016-08-03 DIAGNOSIS — F418 Other specified anxiety disorders: Secondary | ICD-10-CM

## 2016-08-03 DIAGNOSIS — I10 Essential (primary) hypertension: Secondary | ICD-10-CM

## 2016-08-04 NOTE — Telephone Encounter (Signed)
Lori [name] inappropriately entered me as PCP after she had been dismissed from [facility] for freq no show (hasn't been seen for about one yr). We are past the 30 day window in which we must provide refills. Refill request denied. [facility] Ivin [facility]y - more education about how inappropriate it is for others to alter PCPs is needed. Thanks

## 2016-08-07 ENCOUNTER — Other Ambulatory Visit: Payer: Self-pay | Admitting: Internal Medicine

## 2016-08-08 ENCOUNTER — Ambulatory Visit
Admission: RE | Admit: 2016-08-08 | Discharge: 2016-08-08 | Disposition: A | Discharge status: Home / Self Care | Payer: Medicaid Other | Source: Ambulatory Visit | Attending: Internal Medicine | Admitting: Internal Medicine

## 2016-08-08 DIAGNOSIS — R928 Other abnormal and inconclusive findings on diagnostic imaging of breast: Secondary | ICD-10-CM

## 2016-08-11 ENCOUNTER — Telehealth (INDEPENDENT_AMBULATORY_CARE_PROVIDER_SITE_OTHER): Payer: Self-pay | Admitting: Orthopedic Surgery

## 2016-08-11 NOTE — Telephone Encounter (Signed)
Returned call to patient left message on voicemail to return call to schedule appointment  [phone]

## 2016-08-15 ENCOUNTER — Other Ambulatory Visit: Payer: Self-pay | Admitting: Internal Medicine

## 2016-08-15 DIAGNOSIS — F418 Other specified anxiety disorders: Secondary | ICD-10-CM

## 2016-08-16 ENCOUNTER — Ambulatory Visit (INDEPENDENT_AMBULATORY_CARE_PROVIDER_SITE_OTHER): Payer: Medicaid Other | Admitting: Orthopedic Surgery

## 2016-08-16 DIAGNOSIS — S86899S Other injury of other muscle(s) and tendon(s) at lower leg level, unspecified leg, sequela: Secondary | ICD-10-CM | POA: Diagnosis not present

## 2016-08-16 NOTE — Progress Notes (Signed)
Office Visit Note   Patient: Lori [name]           Date of Birth: [birth year]           MRN: [id number] Visit Date: 08/16/2016              Requested by: No referring provider defined for this encounter. PCP: No PCP Per Patient  No chief complaint on file.     HPI: Patient presents in follow-up for bilateral shin splints worse on the right than the left patient complains of a sharp stabbing pain that wakes her up at night pain with weightbearing at all times it is not related to the amount of activity. Patient denies any radicular symptoms. She has tried muscle relaxants without relief. She states she is on disability for her hypertension and bipolar disease. She is on several blood pressure medications and is also on an antidepressant. Patient states that she is crying a lot.  Assessment & Plan: Visit Diagnoses:  1. Shin splints, sequela     Plan: Recommended a cooker water to help restore the potassium secondary to loss from her blood pressure medication. Recommended knee-high 15-20 mm compression stockings.  Follow-Up Instructions: Return in about 3 weeks (around 09/06/2016).   Ortho Exam  Patient is alert, oriented, no adenopathy, well-dressed, normal affect, normal respiratory effort. On examination patient has an antalgic gait. She is a good dorsalis pedis pulse bilaterally she has a negative straight leg raise bilaterally and has good dorsiflexion of both ankles. She has no pain to palpation over the tibial crest she is point tender to palpation over the medial border of the tibia along the entire midsection of the tibia consistent with periostitis bilateral lower extremities worse on the right than the left. There is no skin color or temperature changes. Patient has no motor weakness in either lower extremity.  Imaging: No results found.  Labs: Lab Results  Component Value Date   HGBA1C 5.5 07/02/2015   HGBA1C 5.5 05/10/2011    Orders:  No orders of the defined types  were placed in this encounter.  No orders of the defined types were placed in this encounter.    Procedures: No procedures performed  Clinical Data: No additional findings.  ROS:  All other systems negative, except as noted in the HPI. Review of Systems  Objective: Vital Signs: There were no vitals taken for this visit.  Specialty Comments:  No specialty comments available.  PMFS History: Patient Active Problem List   Diagnosis Date Noted  . Shin splints, sequela 08/16/2016  . Cellulitis of groin 01/05/2016  . Encounter for screening mammogram for breast cancer 07/02/2015  . 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 Allergic rhinitis 11/27/2014  . Otitis media of right ear 07/01/2012  . Cough 11/20/2011  . Renal lesion 06/12/2011  . Nausea 05/10/2011  . Caries 04/12/2011  . Abdominal pain 10/25/2010  . Preventive measure 10/25/2010  . Shoulder pain, left 10/25/2010  . Leg pain 06/20/2010  . CERVICAL CANCER 10/23/2008  . TOBACCO ABUSE 10/23/2008  . Depression with anxiety 10/23/2008  . Essential hypertension 10/23/2008  . GERD 10/23/2008  . Insomnia 10/23/2008  . SNORING 10/23/2008   Past Medical History:  Diagnosis Date  . Arthritis   . Cancer ([place])   . Depression   . Depression with anxiety 2008/08/18   Patient had multiple death in her family over a short period of time. father, brother, uncle and niece who was stabbed 3 years ago. Patient's  husband had a car accident last year and  need currently a feeding tube.   Marland Kitchen GERD (gastroesophageal reflux disease)   . Headache(784.0)   . History of cervical cancer  1985    status post partial hysterectomy , last Pap smear 10 years ago , no further followup  . History of cocaine abuse 10/23/2008    last documented in 2007 when admitted for hypertensive urgency  . Hypertension 2007   Admitted for HTN crisis in 10/2008. Was given in 2010 a wrong prescription from the pharmacy and per ED note it was Lisinopril-Hydrocholorthizide  which gave her a  hives and feeling of sickness. Patient was started  on this meds on 05/31/2010 by Dr  Ihor Gully without any problem.   . Insomnia   . Tobacco abuse     35 years    Family History  Problem Relation Age of Onset  . Thyroid disease Mother   . Diabetes Father   . Heart disease Father   . Hyperlipidemia Father   . Hypertension Father   . Diabetes Brother   . Hypertension Brother   . Hyperlipidemia Brother   . Colon polyps Brother   . Thyroid disease Daughter   . Obesity Daughter   . Mental illness Daughter   . Colon cancer Maternal Uncle     Past Surgical History:  Procedure Laterality Date  . ABDOMINAL HYSTERECTOMY    . CESAREAN SECTION  1985  . SKIN GRAFT     Social History   Occupational History  . Not on file.   Social History Main Topics  . Smoking status: Current Some Day Smoker    Packs/day: 0.50    Years: 43.00    Types: Cigarettes  . Smokeless tobacco: Never Used     Comment: patches have helped in the past  . Alcohol use No  . Drug use: No  . Sexual activity: Not on file

## 2016-09-06 ENCOUNTER — Ambulatory Visit (INDEPENDENT_AMBULATORY_CARE_PROVIDER_SITE_OTHER): Payer: Medicaid Other | Admitting: Orthopedic Surgery

## 2016-09-10 ENCOUNTER — Inpatient Hospital Stay (HOSPITAL_COMMUNITY)
Admission: EM | Admit: 2016-09-10 | Discharge: 2016-09-12 | DRG: 439 | Disposition: A | Discharge status: Home / Self Care | Payer: Medicaid Other | Attending: Internal Medicine | Admitting: Internal Medicine

## 2016-09-10 ENCOUNTER — Encounter (HOSPITAL_COMMUNITY): Payer: Self-pay

## 2016-09-10 DIAGNOSIS — G629 Polyneuropathy, unspecified: Secondary | ICD-10-CM | POA: Diagnosis present

## 2016-09-10 DIAGNOSIS — Z792 Long term (current) use of antibiotics: Secondary | ICD-10-CM

## 2016-09-10 DIAGNOSIS — Z7951 Long term (current) use of inhaled steroids: Secondary | ICD-10-CM

## 2016-09-10 DIAGNOSIS — I10 Essential (primary) hypertension: Secondary | ICD-10-CM | POA: Diagnosis present

## 2016-09-10 DIAGNOSIS — N3090 Cystitis, unspecified without hematuria: Secondary | ICD-10-CM | POA: Diagnosis present

## 2016-09-10 DIAGNOSIS — F418 Other specified anxiety disorders: Secondary | ICD-10-CM | POA: Diagnosis present

## 2016-09-10 DIAGNOSIS — E871 Hypo-osmolality and hyponatremia: Secondary | ICD-10-CM | POA: Diagnosis present

## 2016-09-10 DIAGNOSIS — F17200 Nicotine dependence, unspecified, uncomplicated: Secondary | ICD-10-CM | POA: Diagnosis present

## 2016-09-10 DIAGNOSIS — Z8249 Family history of ischemic heart disease and other diseases of the circulatory system: Secondary | ICD-10-CM

## 2016-09-10 DIAGNOSIS — K05219 Aggressive periodontitis, localized, unspecified severity: Secondary | ICD-10-CM | POA: Diagnosis present

## 2016-09-10 DIAGNOSIS — E860 Dehydration: Secondary | ICD-10-CM | POA: Diagnosis present

## 2016-09-10 DIAGNOSIS — K0510 Chronic gingivitis, plaque induced: Secondary | ICD-10-CM | POA: Diagnosis present

## 2016-09-10 DIAGNOSIS — Z853 Personal history of malignant neoplasm of breast: Secondary | ICD-10-CM

## 2016-09-10 DIAGNOSIS — Z8541 Personal history of malignant neoplasm of cervix uteri: Secondary | ICD-10-CM

## 2016-09-10 DIAGNOSIS — K8500 Idiopathic acute pancreatitis without necrosis or infection: Principal | ICD-10-CM | POA: Diagnosis present

## 2016-09-10 DIAGNOSIS — N390 Urinary tract infection, site not specified: Secondary | ICD-10-CM

## 2016-09-10 DIAGNOSIS — F17210 Nicotine dependence, cigarettes, uncomplicated: Secondary | ICD-10-CM | POA: Diagnosis present

## 2016-09-10 DIAGNOSIS — K219 Gastro-esophageal reflux disease without esophagitis: Secondary | ICD-10-CM | POA: Diagnosis present

## 2016-09-10 DIAGNOSIS — N179 Acute kidney failure, unspecified: Secondary | ICD-10-CM | POA: Diagnosis present

## 2016-09-10 DIAGNOSIS — Z8349 Family history of other endocrine, nutritional and metabolic diseases: Secondary | ICD-10-CM

## 2016-09-10 DIAGNOSIS — K029 Dental caries, unspecified: Secondary | ICD-10-CM | POA: Diagnosis present

## 2016-09-10 DIAGNOSIS — K859 Acute pancreatitis without necrosis or infection, unspecified: Secondary | ICD-10-CM | POA: Diagnosis present

## 2016-09-10 DIAGNOSIS — Z23 Encounter for immunization: Secondary | ICD-10-CM

## 2016-09-10 DIAGNOSIS — G5793 Unspecified mononeuropathy of bilateral lower limbs: Secondary | ICD-10-CM | POA: Diagnosis present

## 2016-09-10 DIAGNOSIS — Z90710 Acquired absence of both cervix and uterus: Secondary | ICD-10-CM

## 2016-09-10 DIAGNOSIS — Z833 Family history of diabetes mellitus: Secondary | ICD-10-CM

## 2016-09-10 DIAGNOSIS — D649 Anemia, unspecified: Secondary | ICD-10-CM

## 2016-09-10 LAB — COMPREHENSIVE METABOLIC PANEL
ALT: 12 U/L — ABNORMAL LOW (ref 14–54)
AST: 20 U/L (ref 15–41)
Albumin: 4.1 g/dL (ref 3.5–5.0)
Alkaline Phosphatase: 97 U/L (ref 38–126)
Anion gap: 11 (ref 5–15)
BUN: 17 mg/dL (ref 6–20)
CO2: 17 mmol/L — ABNORMAL LOW (ref 22–32)
Calcium: 9.8 mg/dL (ref 8.9–10.3)
Chloride: 105 mmol/L (ref 101–111)
Creatinine, Ser: 1.14 mg/dL — ABNORMAL HIGH (ref 0.44–1.00)
GFR calc Af Amer: 59 mL/min — ABNORMAL LOW (ref >60)
GFR calc non Af Amer: 51 mL/min — ABNORMAL LOW (ref >60)
Glucose, Bld: 112 mg/dL — ABNORMAL HIGH (ref 65–99)
Potassium: 4 mmol/L (ref 3.5–5.1)
Sodium: 133 mmol/L — ABNORMAL LOW (ref 135–145)
Total Bilirubin: 0.7 mg/dL (ref 0.3–1.2)
Total Protein: 8.8 g/dL — ABNORMAL HIGH (ref 6.5–8.1)

## 2016-09-10 LAB — CBC
HCT: 36.5 % (ref 36.0–46.0)
Hemoglobin: 12.2 g/dL (ref 12.0–15.0)
MCH: 31.9 pg (ref 26.0–34.0)
MCHC: 33.4 g/dL (ref 30.0–36.0)
MCV: 95.3 fL (ref 78.0–100.0)
Platelets: 372 10*3/uL (ref 150–400)
RBC: 3.83 MIL/uL — ABNORMAL LOW (ref 3.87–5.11)
RDW: 13 % (ref 11.5–15.5)
WBC: 12.3 10*3/uL — ABNORMAL HIGH (ref 4.0–10.5)

## 2016-09-10 LAB — URINALYSIS, ROUTINE W REFLEX MICROSCOPIC
Bilirubin Urine: NEGATIVE
Glucose, UA: NEGATIVE mg/dL
Ketones, ur: NEGATIVE mg/dL
Nitrite: NEGATIVE
Protein, ur: NEGATIVE mg/dL
Specific Gravity, Urine: 1.008 (ref 1.005–1.030)
pH: 5 (ref 5.0–8.0)

## 2016-09-10 LAB — LIPASE, BLOOD: Lipase: 318 U/L — ABNORMAL HIGH (ref 11–51)

## 2016-09-10 MED ORDER — SODIUM CHLORIDE 0.9 % IV BOLUS (SEPSIS)
1000.0000 mL | Freq: Once | INTRAVENOUS | Status: AC
  Administered 2016-09-11: 1000 mL via INTRAVENOUS

## 2016-09-10 MED ORDER — MORPHINE SULFATE (PF) 4 MG/ML IV SOLN
4.0000 mg | Freq: Once | INTRAVENOUS | Status: AC
  Administered 2016-09-11: 4 mg via INTRAVENOUS
  Filled 2016-09-10: qty 1

## 2016-09-10 MED ORDER — DEXTROSE 5 % IV SOLN
1.0000 g | Freq: Once | INTRAVENOUS | Status: AC
  Administered 2016-09-11: 1 g via INTRAVENOUS
  Filled 2016-09-10: qty 10

## 2016-09-10 MED ORDER — ONDANSETRON HCL 4 MG/2ML IJ SOLN
4.0000 mg | Freq: Once | INTRAMUSCULAR | Status: AC
  Administered 2016-09-11: 4 mg via INTRAVENOUS
  Filled 2016-09-10: qty 2

## 2016-09-10 NOTE — ED Triage Notes (Signed)
Pt complaining of abdominal pain. Pt denies any N/V/D. Pt states LBM this morning. Pt complaining of painful urination.

## 2016-09-10 NOTE — ED Provider Notes (Signed)
[facility] DEPT Provider Note   CSN: [id number] Arrival date & time: 09/10/16  31-Aug-2151     History   Chief Complaint Chief Complaint  Patient presents with  . Dysuria  . Abdominal Pain    HPI Lori [name] is a 61 y.o. female.  HPI   61 year old female history of cocaine abuse, cervical cancer, GERD, hypertension presenting complaining of abdominal pain. Patient reports gradual onset of left upper quadrant abdominal pain which has been intermittent but has become progressively worse and more consistence for the past 4 days. Pain is sharp, achy, wraps around her abdomen towards her back, worse after eating or drinking. Since last night she also complaining of burning on urination. She reports decrease in appetite without nausea vomiting or diarrhea. Denies having fever or chills,, chest pain or shortness of breath. She reportedly had several infected teeth for the past month in which she has been seen by her dentist who gave her opiate pain medication, and penicillin. She has been taking penicillin for several weeks. She did get dental extraction from her oral surgeon 2 weeks ago. She denies any alcohol use, denies any drug use, no history of diabetes. Her pain is currently moderate to severe. Prior abdominal hysterectomy.  Past Medical History:  Diagnosis Date  . Arthritis   . Cancer ([place])   . Depression   . Depression with anxiety August 30, 2008   Patient had multiple death in her family over a short period of time. father, brother, uncle and niece who was stabbed 3 years ago. Patient's husband had a car accident last year and  need currently a feeding tube.   Marland Kitchen GERD (gastroesophageal reflux disease)   . Headache(784.0)   . History of cervical cancer  1985    status post partial hysterectomy , last Pap smear 10 years ago , no further followup  . History of cocaine abuse 10/23/2008    last documented in 08-30-2005 when admitted for hypertensive urgency  . Hypertension 08-30-2005   Admitted for HTN crisis  in 10/2008. Was given in August 30, 2008 a wrong prescription from the pharmacy and per ED note it was Lisinopril-Hydrocholorthizide which gave her a  hives and feeling of sickness. Patient was started  on this meds on 05/31/2010 by Dr  Ihor Gully without any problem.   . Insomnia   . Tobacco abuse     35 years    Patient Active Problem List   Diagnosis Date Noted  . Shin splints, sequela 08/16/2016  . Cellulitis of groin 01/05/2016  . Encounter for screening mammogram for breast cancer 07/02/2015  . 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 Allergic rhinitis 11/27/2014  . Otitis media of right ear 07/01/2012  . Cough 11/20/2011  . Renal lesion 06/12/2011  . Nausea 05/10/2011  . Caries 04/12/2011  . Abdominal pain 10/25/2010  . Preventive measure 10/25/2010  . Shoulder pain, left 10/25/2010  . Leg pain 06/20/2010  . CERVICAL CANCER 10/23/2008  . TOBACCO ABUSE 10/23/2008  . Depression with anxiety 10/23/2008  . Essential hypertension 10/23/2008  . GERD 10/23/2008  . Insomnia 10/23/2008  . SNORING 10/23/2008    Past Surgical History:  Procedure Laterality Date  . ABDOMINAL HYSTERECTOMY    . CESAREAN SECTION  1985  . SKIN GRAFT      OB History    No data available       Home Medications    Prior to Admission medications   Medication Sig Start Date End Date Taking? Authorizing Provider  clotrimazole (LOTRIMIN)  1 % cream Apply to affected area 2 times daily 01/02/16   Dorie Rank, MD  diphenhydramine-acetaminophen (TYLENOL PM) 25-500 MG TABS Take 1-2 tablets by mouth at bedtime as needed (sleep).    [provider]  fluconazole (DIFLUCAN) 150 MG tablet Take 1 tablet (150 mg total) by mouth every other day. For 3 doses 01/05/16   Burgess Estelle, MD  FLUoxetine (PROZAC) 40 MG capsule TAKE 1 CAPSULE (40 MG TOTAL) BY MOUTH DAILY. 06/12/16   Burgess Estelle, MD  fluticasone (FLONASE) 50 MCG/ACT nasal spray Place 2 sprays into both nostrils daily. 11/27/14 11/27/15  Burgess Estelle, MD    gabapentin (NEURONTIN) 300 MG capsule TAKE 1 CAPSULE (300 MG TOTAL) BY MOUTH AT BEDTIME. 07/07/16   Burgess Estelle, MD  hydrochlorothiazide (MICROZIDE) 12.5 MG capsule Take 1 capsule (12.5 mg total) by mouth daily. 07/02/15 07/01/16  Corky Sox, MD  lisinopril (PRINIVIL,ZESTRIL) 20 MG tablet TAKE 1 TABLET (20 MG TOTAL) BY MOUTH DAILY. 07/07/16   Burgess Estelle, MD  loratadine (CLARITIN) 10 MG tablet Take 1 tablet (10 mg total) by mouth daily. 11/27/14 11/27/15  Burgess Estelle, MD  nicotine (NICODERM CQ - DOSED IN MG/24 HOURS) 14 mg/24hr patch Place 1 patch (14 mg total) onto the skin daily. 01/02/16   Dorie Rank, MD  ondansetron (ZOFRAN) 4 MG tablet Take 1 tablet (4 mg total) by mouth every 6 (six) hours. 01/02/16   Dorie Rank, MD  saccharomyces boulardii (FLORASTOR) 250 MG capsule Take 1 capsule (250 mg total) by mouth 2 (two) times daily. 01/02/16   Dorie Rank, MD    Family History Family History  Problem Relation Age of Onset  . Thyroid disease Mother   . Diabetes Father   . Heart disease Father   . Hyperlipidemia Father   . Hypertension Father   . Diabetes Brother   . Hypertension Brother   . Hyperlipidemia Brother   . Colon polyps Brother   . Thyroid disease Daughter   . Obesity Daughter   . Mental illness Daughter   . Colon cancer Maternal Uncle     Social History Social History  Substance Use Topics  . Smoking status: Current Some Day Smoker    Packs/day: 0.50    Years: 43.00    Types: Cigarettes  . Smokeless tobacco: Never Used     Comment: patches have helped in the past  . Alcohol use No     Allergies   Patient has no known allergies.   Review of Systems Review of Systems  All other systems reviewed and are negative.    Physical Exam Updated Vital Signs BP (!) 157/114 (BP Location: Right Arm)   Pulse (!) 110   Temp 98.6 F (37 C)   Resp (!) 24   Ht 5\' 2"  (1.575 m)   Wt 77.1 kg   SpO2 100%   BMI 31.09 kg/m   Physical Exam  Constitutional: She appears  well-developed and well-nourished. No distress.  Patient appears uncomfortable, crying and appears in pain.  HENT:  Head: Atraumatic.  Eyes: Conjunctivae are normal.  Neck: Neck supple.  Cardiovascular:  Tachycardia without murmurs rubs or gallops  Pulmonary/Chest: Effort normal and breath sounds normal.  Abdominal: Soft. She exhibits no distension. There is tenderness (Tenderness most significant to left upper quadrant on palpation without guarding or rebound tenderness. Mild suprapubic tenderness. Negative Murphy sign, no pain at McBurney's point.).  Genitourinary:  Genitourinary Comments:  No CVA tenderness  Neurological: She is alert.  Skin: No rash  noted.  Psychiatric: She has a normal mood and affect.  Nursing note and vitals reviewed.    ED Treatments / Results  Labs (all labs ordered are listed, but only abnormal results are displayed) Labs Reviewed  LIPASE, BLOOD - Abnormal; Notable for the following:       Result Value   Lipase 318 (*)    All other components within normal limits  COMPREHENSIVE METABOLIC PANEL - Abnormal; Notable for the following:    Sodium 133 (*)    CO2 17 (*)    Glucose, Bld 112 (*)    Creatinine, Ser 1.14 (*)    Total Protein 8.8 (*)    ALT 12 (*)    GFR calc non Af Amer 51 (*)    GFR calc Af Amer 59 (*)    All other components within normal limits  CBC - Abnormal; Notable for the following:    WBC 12.3 (*)    RBC 3.83 (*)    All other components within normal limits  URINALYSIS, ROUTINE W REFLEX MICROSCOPIC - Abnormal; Notable for the following:    Color, Urine STRAW (*)    Hgb urine dipstick SMALL (*)    Leukocytes, UA MODERATE (*)    Bacteria, UA RARE (*)    Squamous Epithelial / LPF 0-5 (*)    All other components within normal limits  URINE CULTURE  RAPID URINE DRUG SCREEN, HOSP PERFORMED    EKG  EKG Interpretation None       Radiology Ct Abdomen Pelvis W Contrast  Result Date: 09/11/2016 CLINICAL DATA:  LEFT upper  quadrant pain, elevated lipase. History of substance abuse, cervical cancer and hysterectomy. EXAM: CT ABDOMEN AND PELVIS WITH CONTRAST TECHNIQUE: Multidetector CT imaging of the abdomen and pelvis was performed using the standard protocol following bolus administration of intravenous contrast. CONTRAST:  151mL ISOVUE-300 IOPAMIDOL (ISOVUE-300) INJECTION 61% COMPARISON:  CT abdomen and pelvis October 31, 2011 and MRI of the abdomen June 22, 2011 FINDINGS: LOWER CHEST: Lung bases are clear. Included heart size is normal. No pericardial effusion. HEPATOBILIARY: Liver and gallbladder are normal. PANCREAS: Pancreas is mildly enlarged edematous with peripancreatic fat stranding. Homogeneous enhancement of the pancreas without pseudocyst, ductal dilatation, mass or calcifications. SPLEEN: Normal. ADRENALS/URINARY TRACT: Kidneys are orthotopic, demonstrating symmetric enhancement. No nephrolithiasis, hydronephrosis or solid renal masses. LEFT renal cysts measuring to 2.5 cm, characterized as benign on prior MRI. The unopacified ureters are normal in course and caliber. Too small to characterize hypodensity RIGHT kidney. Delayed imaging through the kidneys demonstrates symmetric prompt contrast excretion within the proximal urinary collecting system. Urinary bladder is well distended and unremarkable. Normal adrenal glands. STOMACH/BOWEL: The stomach, small and large bowel are normal in course and caliber without inflammatory changes. Normal appendix. VASCULAR/LYMPHATIC: Aortoiliac vessels are normal in course and caliber, mild calcific atherosclerosis. No lymphadenopathy by CT size criteria. REPRODUCTIVE: Status post hysterectomy. OTHER: No intraperitoneal free fluid or free air. MUSCULOSKELETAL: Nonacute. Grade 1 L4-5 anterolisthesis. Severe L5-S1 degenerative disc and lower lumbar facet arthropathy. Tiny fat containing umbilical hernia. IMPRESSION: Acute pancreatitis without necrosis or complication. Electronically  Signed   By: Elon Alas M.D.   On: 09/11/2016 01:13    Procedures Procedures (including critical care time)  Medications Ordered in ED Medications  nicotine (NICODERM CQ - dosed in mg/24 hours) patch 21 mg (not administered)  sodium chloride 0.9 % bolus 1,000 mL (0 mLs Intravenous Stopped 09/11/16 0112)  morphine 4 MG/ML injection 4 mg (4 mg Intravenous Given 09/11/16 0017)  ondansetron [facility]) injection 4 mg (4 mg Intravenous Given 09/11/16 0015)  cefTRIAXone (ROCEPHIN) 1 g in dextrose 5 % 50 mL IVPB (0 g Intravenous Stopped 09/11/16 0047)  iopamidol (ISOVUE-300) 61 % injection 100 mL (100 mLs Intravenous Contrast Given 09/11/16 0045)     Initial Impression / Assessment and Plan / ED Course  I have reviewed the triage vital signs and the nursing notes.  Pertinent labs & imaging results that were available during my care of the patient were reviewed by me and considered in my medical decision making (see chart for details).     BP (!) 139/59   Pulse 95   Temp 98.6 F (37 C)   Resp (!) 24   Ht 5\' 2"  (1.575 m)   Wt 77.1 kg   SpO2 100%   BMI 31.09 kg/m    Final Clinical Impressions(s) / ED Diagnoses   Final diagnoses:  Idiopathic acute pancreatitis without infection or necrosis  Urinary tract infection without hematuria, site unspecified    New Prescriptions New Prescriptions   No medications on file   11:49 PM Patient here with progressive worsening left upper quadrant abdominal pain. She also complaining of dysuria. Her labs remarkable for an elevated lipase of 318. No history of alcohol abuse or diabetes. Will obtain abdominal and pelvis CT scan for further evaluation. Does have an elevated white count of 12.3, UA questionable UTI, given her urinary discomfort, will treat with Rocephin. Pain medication given. Mild AKI with creatinine of 1.14, IV fluid given.   1:20 AM Elevated lipase of 318, abdominal pelvic CT scan demonstrated acute appendicitis without necrosis or  complication. Patient report pain medication did seem to help with her discomfort. She is also receiving IV antibiotic for her urinary tract infection. Plan to have patient admitted for further evaluation. Furthermore, patient has not been taking her blood pressure medication for the past several weeks and currently switching provider. She was found to be hypertensive initially however her blood pressure has improved.  1:32 AM Appreciate consultation from [facility], Dr. Hal Hope who agrees to see and admit pt for further care.    Domenic Moras, PA-C 09/11/16 [facility]    Ripley Fraise, MD 09/11/16 [phone]

## 2016-09-11 ENCOUNTER — Encounter: Payer: Self-pay | Admitting: Gastroenterology

## 2016-09-11 ENCOUNTER — Encounter (HOSPITAL_COMMUNITY): Payer: Self-pay | Admitting: Internal Medicine

## 2016-09-11 ENCOUNTER — Emergency Department (HOSPITAL_COMMUNITY): Payer: Medicaid Other

## 2016-09-11 DIAGNOSIS — F17210 Nicotine dependence, cigarettes, uncomplicated: Secondary | ICD-10-CM | POA: Diagnosis present

## 2016-09-11 DIAGNOSIS — F418 Other specified anxiety disorders: Secondary | ICD-10-CM | POA: Diagnosis present

## 2016-09-11 DIAGNOSIS — E860 Dehydration: Secondary | ICD-10-CM | POA: Diagnosis present

## 2016-09-11 DIAGNOSIS — N3090 Cystitis, unspecified without hematuria: Secondary | ICD-10-CM | POA: Diagnosis present

## 2016-09-11 DIAGNOSIS — K8500 Idiopathic acute pancreatitis without necrosis or infection: Secondary | ICD-10-CM | POA: Diagnosis present

## 2016-09-11 DIAGNOSIS — K05219 Aggressive periodontitis, localized, unspecified severity: Secondary | ICD-10-CM | POA: Diagnosis present

## 2016-09-11 DIAGNOSIS — K219 Gastro-esophageal reflux disease without esophagitis: Secondary | ICD-10-CM | POA: Diagnosis present

## 2016-09-11 DIAGNOSIS — G629 Polyneuropathy, unspecified: Secondary | ICD-10-CM | POA: Diagnosis present

## 2016-09-11 DIAGNOSIS — Z7951 Long term (current) use of inhaled steroids: Secondary | ICD-10-CM | POA: Diagnosis not present

## 2016-09-11 DIAGNOSIS — Z90710 Acquired absence of both cervix and uterus: Secondary | ICD-10-CM | POA: Diagnosis not present

## 2016-09-11 DIAGNOSIS — K029 Dental caries, unspecified: Secondary | ICD-10-CM | POA: Diagnosis not present

## 2016-09-11 DIAGNOSIS — Z8349 Family history of other endocrine, nutritional and metabolic diseases: Secondary | ICD-10-CM | POA: Diagnosis not present

## 2016-09-11 DIAGNOSIS — I10 Essential (primary) hypertension: Secondary | ICD-10-CM | POA: Diagnosis present

## 2016-09-11 DIAGNOSIS — Z23 Encounter for immunization: Secondary | ICD-10-CM | POA: Diagnosis not present

## 2016-09-11 DIAGNOSIS — Z8249 Family history of ischemic heart disease and other diseases of the circulatory system: Secondary | ICD-10-CM | POA: Diagnosis not present

## 2016-09-11 DIAGNOSIS — Z853 Personal history of malignant neoplasm of breast: Secondary | ICD-10-CM | POA: Diagnosis not present

## 2016-09-11 DIAGNOSIS — Z833 Family history of diabetes mellitus: Secondary | ICD-10-CM | POA: Diagnosis not present

## 2016-09-11 DIAGNOSIS — R109 Unspecified abdominal pain: Secondary | ICD-10-CM | POA: Diagnosis present

## 2016-09-11 DIAGNOSIS — K0510 Chronic gingivitis, plaque induced: Secondary | ICD-10-CM | POA: Diagnosis present

## 2016-09-11 DIAGNOSIS — Z8541 Personal history of malignant neoplasm of cervix uteri: Secondary | ICD-10-CM | POA: Diagnosis not present

## 2016-09-11 DIAGNOSIS — D649 Anemia, unspecified: Secondary | ICD-10-CM | POA: Diagnosis present

## 2016-09-11 DIAGNOSIS — K859 Acute pancreatitis without necrosis or infection, unspecified: Secondary | ICD-10-CM | POA: Diagnosis present

## 2016-09-11 DIAGNOSIS — Z792 Long term (current) use of antibiotics: Secondary | ICD-10-CM | POA: Diagnosis not present

## 2016-09-11 DIAGNOSIS — E871 Hypo-osmolality and hyponatremia: Secondary | ICD-10-CM | POA: Diagnosis present

## 2016-09-11 DIAGNOSIS — N179 Acute kidney failure, unspecified: Secondary | ICD-10-CM | POA: Diagnosis present

## 2016-09-11 LAB — CBC WITH DIFFERENTIAL/PLATELET
Basophils Absolute: 0 10*3/uL (ref 0.0–0.1)
Basophils Relative: 0 %
Eosinophils Absolute: 0.2 10*3/uL (ref 0.0–0.7)
Eosinophils Relative: 2 %
HCT: 34.7 % — ABNORMAL LOW (ref 36.0–46.0)
Hemoglobin: 11.2 g/dL — ABNORMAL LOW (ref 12.0–15.0)
Lymphocytes Relative: 22 %
Lymphs Abs: 1.9 10*3/uL (ref 0.7–4.0)
MCH: 30.9 pg (ref 26.0–34.0)
MCHC: 32.3 g/dL (ref 30.0–36.0)
MCV: 95.9 fL (ref 78.0–100.0)
Monocytes Absolute: 0.9 10*3/uL (ref 0.1–1.0)
Monocytes Relative: 11 %
Neutro Abs: 5.5 10*3/uL (ref 1.7–7.7)
Neutrophils Relative %: 65 %
Platelets: 320 10*3/uL (ref 150–400)
RBC: 3.62 MIL/uL — ABNORMAL LOW (ref 3.87–5.11)
RDW: 13.2 % (ref 11.5–15.5)
WBC: 8.6 10*3/uL (ref 4.0–10.5)

## 2016-09-11 LAB — HEPATIC FUNCTION PANEL
ALT: 10 U/L — ABNORMAL LOW (ref 14–54)
AST: 19 U/L (ref 15–41)
Albumin: 3.7 g/dL (ref 3.5–5.0)
Alkaline Phosphatase: 85 U/L (ref 38–126)
Bilirubin, Direct: 0.1 mg/dL (ref 0.1–0.5)
Indirect Bilirubin: 0.6 mg/dL (ref 0.3–0.9)
Total Bilirubin: 0.7 mg/dL (ref 0.3–1.2)
Total Protein: 7.7 g/dL (ref 6.5–8.1)

## 2016-09-11 LAB — RAPID URINE DRUG SCREEN, HOSP PERFORMED
Amphetamines: NOT DETECTED
Barbiturates: NOT DETECTED
Benzodiazepines: NOT DETECTED
Cocaine: NOT DETECTED
Opiates: NOT DETECTED
Tetrahydrocannabinol: NOT DETECTED

## 2016-09-11 LAB — BASIC METABOLIC PANEL
Anion gap: 8 (ref 5–15)
BUN: 13 mg/dL (ref 6–20)
CO2: 24 mmol/L (ref 22–32)
Calcium: 9.2 mg/dL (ref 8.9–10.3)
Chloride: 106 mmol/L (ref 101–111)
Creatinine, Ser: 1.02 mg/dL — ABNORMAL HIGH (ref 0.44–1.00)
GFR calc Af Amer: >60 mL/min (ref >60)
GFR calc non Af Amer: 59 mL/min — ABNORMAL LOW (ref >60)
Glucose, Bld: 102 mg/dL — ABNORMAL HIGH (ref 65–99)
Potassium: 4.1 mmol/L (ref 3.5–5.1)
Sodium: 138 mmol/L (ref 135–145)

## 2016-09-11 LAB — LIPASE, BLOOD: Lipase: 208 U/L — ABNORMAL HIGH (ref 11–51)

## 2016-09-11 LAB — TROPONIN I: Troponin I: <0.03 ng/mL (ref <0.03)

## 2016-09-11 LAB — HIV ANTIBODY (ROUTINE TESTING W REFLEX): HIV Screen 4th Generation wRfx: NONREACTIVE

## 2016-09-11 LAB — TRIGLYCERIDES: Triglycerides: 106 mg/dL (ref <150)

## 2016-09-11 MED ORDER — ONDANSETRON HCL 4 MG/2ML IJ SOLN
4.0000 mg | Freq: Four times a day (QID) | INTRAMUSCULAR | Status: DC | PRN
  Administered 2016-09-11 – 2016-09-12 (×2): 4 mg via INTRAVENOUS
  Filled 2016-09-11 (×2): qty 2

## 2016-09-11 MED ORDER — MORPHINE SULFATE (PF) 4 MG/ML IV SOLN
1.0000 mg | INTRAVENOUS | Status: DC | PRN
  Administered 2016-09-11 (×2): 1 mg via INTRAVENOUS
  Filled 2016-09-11 (×3): qty 1

## 2016-09-11 MED ORDER — PNEUMOCOCCAL VAC POLYVALENT 25 MCG/0.5ML IJ INJ
0.5000 mL | INJECTION | INTRAMUSCULAR | Status: AC
  Administered 2016-09-12: 0.5 mL via INTRAMUSCULAR
  Filled 2016-09-11: qty 0.5

## 2016-09-11 MED ORDER — SODIUM CHLORIDE 0.9 % IV SOLN
INTRAVENOUS | Status: DC
  Administered 2016-09-11 (×2): via INTRAVENOUS

## 2016-09-11 MED ORDER — ACETAMINOPHEN 650 MG RE SUPP: 650.0000 mg | Freq: Four times a day (QID) | RECTAL | Status: DC | PRN

## 2016-09-11 MED ORDER — NICOTINE 21 MG/24HR TD PT24
21.0000 mg | MEDICATED_PATCH | Freq: Every day | TRANSDERMAL | Status: DC
  Administered 2016-09-11 – 2016-09-12 (×3): 21 mg via TRANSDERMAL
  Filled 2016-09-11 (×3): qty 1

## 2016-09-11 MED ORDER — IBUPROFEN 400 MG PO TABS: 400.0000 mg | ORAL_TABLET | Freq: Four times a day (QID) | ORAL | Status: DC | PRN

## 2016-09-11 MED ORDER — ACETAMINOPHEN 325 MG PO TABS
650.0000 mg | ORAL_TABLET | Freq: Four times a day (QID) | ORAL | Status: DC | PRN
  Administered 2016-09-12 (×2): 650 mg via ORAL
  Filled 2016-09-11 (×2): qty 2

## 2016-09-11 MED ORDER — DEXTROSE 5 % IV SOLN
1.0000 g | Freq: Every day | INTRAVENOUS | Status: DC
  Administered 2016-09-11: 1 g via INTRAVENOUS
  Filled 2016-09-11 (×2): qty 10

## 2016-09-11 MED ORDER — MORPHINE SULFATE (PF) 4 MG/ML IV SOLN
1.0000 mg | INTRAVENOUS | Status: DC | PRN
  Administered 2016-09-11 – 2016-09-12 (×3): 1 mg via INTRAVENOUS
  Filled 2016-09-11 (×3): qty 1

## 2016-09-11 MED ORDER — HYDRALAZINE HCL 20 MG/ML IJ SOLN: 10.0000 mg | INTRAMUSCULAR | Status: DC | PRN

## 2016-09-11 MED ORDER — ONDANSETRON HCL 4 MG PO TABS
4.0000 mg | ORAL_TABLET | Freq: Four times a day (QID) | ORAL | Status: DC | PRN
  Administered 2016-09-11 – 2016-09-12 (×2): 4 mg via ORAL
  Filled 2016-09-11 (×2): qty 1

## 2016-09-11 MED ORDER — IOPAMIDOL (ISOVUE-300) INJECTION 61%
100.0000 mL | Freq: Once | INTRAVENOUS | Status: AC | PRN
  Administered 2016-09-11: 100 mL via INTRAVENOUS

## 2016-09-11 NOTE — H&P (Addendum)
History and Physical    Lori [name] ZDG:[id number] DOB: 04-Sep-1955 DOA: 09/10/2016  PCP: Patient, No Pcp Per  Patient coming from: Home.  Chief Complaint: Abdominal pain.  HPI: Lori [name] is a 61 y.o. female with history of hypertension, depression, breast cancer in remission, tobacco abuse presents to the ER with complaints of persistent abdominal pain over the past 3-4 days. Pain is mostly periumbilical and epigastric in area. Has nausea denies any vomiting or diarrhea. Patient thought she may be having constipation and tried laxatives. Despite which the pain did not improve. Patient has been recently on antibiotics for tooth and gum infection and has been on ampicillin for almost 6-7 weeks.   ED Course: In the ER labs revealed elevated lipase. CT of the abdomen and pelvis shows features consistent with pancreatitis. Patient denies drinking alcohol and CAT scan does not show any definite evidence of gallstones. UA shows some bacteria and leukocyte esterase positive for which patient was placed on ceftriaxone. Since patient has significant discomfort patient will be admitted for observation. Denies any chest pain or shortness of breath.  Review of Systems: As per HPI, rest all negative.   Past Medical History:  Diagnosis Date  . Arthritis   . Cancer ([place])   . Depression   . Depression with anxiety Aug 15, 2008   Patient had multiple death in her family over a short period of time. father, brother, uncle and niece who was stabbed 3 years ago. Patient's husband had a car accident last year and  need currently a feeding tube.   Marland Kitchen GERD (gastroesophageal reflux disease)   . Headache(784.0)   . History of cervical cancer  1985    status post partial hysterectomy , last Pap smear 10 years ago , no further followup  . History of cocaine abuse 10/23/2008    last documented in Aug 15, 2005 when admitted for hypertensive urgency  . Hypertension 2005-08-15   Admitted for HTN crisis in 10/2008. Was given in 15-Aug-2008 a wrong  prescription from the pharmacy and per ED note it was Lisinopril-Hydrocholorthizide which gave her a  hives and feeling of sickness. Patient was started  on this meds on 05/31/2010 by Dr  Ihor Gully without any problem.   . Insomnia   . Tobacco abuse     35 years    Past Surgical History:  Procedure Laterality Date  . ABDOMINAL HYSTERECTOMY    . CESAREAN SECTION  1985  . SKIN GRAFT       reports that she has been smoking Cigarettes.  She has a 21.50 pack-year smoking history. She has never used smokeless tobacco. She reports that she does not drink alcohol or use drugs.  No Known Allergies  Family History  Problem Relation Age of Onset  . Thyroid disease Mother   . Diabetes Father   . Heart disease Father   . Hyperlipidemia Father   . Hypertension Father   . Diabetes Brother   . Hypertension Brother   . Hyperlipidemia Brother   . Colon polyps Brother   . Thyroid disease Daughter   . Obesity Daughter   . Mental illness Daughter   . Colon cancer Maternal Uncle     Prior to Admission medications   Medication Sig Start Date End Date Taking? Authorizing Provider  amoxicillin (AMOXIL) 500 MG capsule Take 500 mg by mouth 3 (three) times daily. 08/31/16  Yes [provider]  diphenhydramine-acetaminophen (TYLENOL PM) 25-500 MG TABS Take 1-2 tablets by mouth at bedtime as needed (sleep).  Yes [provider]  FLUoxetine (PROZAC) 40 MG capsule TAKE 1 CAPSULE (40 MG TOTAL) BY MOUTH DAILY. 06/12/16  Yes Burgess Estelle, MD  gabapentin (NEURONTIN) 300 MG capsule TAKE 1 CAPSULE (300 MG TOTAL) BY MOUTH AT BEDTIME. 07/07/16  Yes Burgess Estelle, MD  hydrochlorothiazide (MICROZIDE) 12.5 MG capsule Take 1 capsule (12.5 mg total) by mouth daily. 07/02/15 09/11/16 Yes Corky Sox, MD  HYDROcodone-acetaminophen (NORCO/VICODIN) 5-325 MG tablet Take 1-2 tablets by mouth every 4 (four) hours as needed. 09/05/16  Yes [provider]  ibuprofen (ADVIL,MOTRIN) 800 MG tablet Take 800  mg by mouth every 8 (eight) hours as needed. for pain 08/31/16  Yes [provider]  lisinopril (PRINIVIL,ZESTRIL) 20 MG tablet TAKE 1 TABLET (20 MG TOTAL) BY MOUTH DAILY. 07/07/16  Yes Burgess Estelle, MD  clotrimazole (LOTRIMIN) 1 % cream Apply to affected area 2 times daily Patient not taking: Reported on 09/11/2016 01/02/16   Dorie Rank, MD  fluconazole (DIFLUCAN) 150 MG tablet Take 1 tablet (150 mg total) by mouth every other day. For 3 doses Patient not taking: Reported on 09/11/2016 01/05/16   Burgess Estelle, MD  fluticasone [facility]) 50 MCG/ACT nasal spray Place 2 sprays into both nostrils daily. Patient not taking: Reported on 09/11/2016 11/27/14 09/11/16  Burgess Estelle, MD  loratadine (CLARITIN) 10 MG tablet Take 1 tablet (10 mg total) by mouth daily. Patient not taking: Reported on 09/11/2016 11/27/14 09/11/16  Burgess Estelle, MD  nicotine (NICODERM CQ - DOSED IN MG/24 HOURS) 14 mg/24hr patch Place 1 patch (14 mg total) onto the skin daily. Patient not taking: Reported on 09/11/2016 01/02/16   Dorie Rank, MD  ondansetron (ZOFRAN) 4 MG tablet Take 1 tablet (4 mg total) by mouth every 6 (six) hours. Patient not taking: Reported on 09/11/2016 01/02/16   Dorie Rank, MD  saccharomyces boulardii (FLORASTOR) 250 MG capsule Take 1 capsule (250 mg total) by mouth 2 (two) times daily. Patient not taking: Reported on 09/11/2016 01/02/16   Dorie Rank, MD    Physical Exam: Vitals:   09/11/16 0215 09/11/16 0230 09/11/16 0245 09/11/16 0315  BP: (!) 151/69 (!) 136/53 (!) 136/55   Pulse: 94 96 95   Resp:      Temp:      SpO2: 98% 99% 99% 99%  Weight:      Height:          Constitutional: Moderately built and nourished. Vitals:   09/11/16 0215 09/11/16 0230 09/11/16 0245 09/11/16 0315  BP: (!) 151/69 (!) 136/53 (!) 136/55   Pulse: 94 96 95   Resp:      Temp:      SpO2: 98% 99% 99% 99%  Weight:      Height:       Eyes: Anicteric. No pallor. ENMT: No discharge from the ears eyes nose or mouth. Neck:  No mass felt. No neck rigidity. No JVD appreciated. Respiratory: No rhonchi or crepitations. Cardiovascular: S1 and S2 heard no murmurs appreciated. Abdomen: Mild epigastric tenderness. No guarding or rigidity. Musculoskeletal: No edema. No joint effusion. Skin: No rash. Skin appears warm. Neurologic: Alert awake oriented to time place and person. Moves all extremities. Psychiatric: Appears normal. Normal affect.   Labs on Admission: I have personally reviewed following labs and imaging studies  CBC:  Recent Labs Lab 09/10/16 2209  WBC 12.3*  HGB 12.2  HCT 36.5  MCV 95.3  PLT 638   Basic Metabolic Panel:  Recent Labs Lab 09/10/16 2209  NA 133*  K 4.0  CL 105  CO2 17*  GLUCOSE 112*  BUN 17  CREATININE 1.14*  CALCIUM 9.8   GFR: Estimated Creatinine Clearance: 50.5 mL/min (A) (by C-G formula based on SCr of 1.14 mg/dL (H)). Liver Function Tests:  Recent Labs Lab 09/10/16 2209  AST 20  ALT 12*  ALKPHOS 97  BILITOT 0.7  PROT 8.8*  ALBUMIN 4.1    Recent Labs Lab 09/10/16 2209  LIPASE 318*   No results for input(s): AMMONIA in the last 168 hours. Coagulation Profile: No results for input(s): INR, PROTIME in the last 168 hours. Cardiac Enzymes: No results for input(s): CKTOTAL, CKMB, CKMBINDEX, TROPONINI in the last 168 hours. BNP (last 3 results) No results for input(s): PROBNP in the last 8760 hours. HbA1C: No results for input(s): HGBA1C in the last 72 hours. CBG: No results for input(s): GLUCAP in the last 168 hours. Lipid Profile: No results for input(s): CHOL, HDL, LDLCALC, TRIG, CHOLHDL, LDLDIRECT in the last 72 hours. Thyroid Function Tests: No results for input(s): TSH, T4TOTAL, FREET4, T3FREE, THYROIDAB in the last 72 hours. Anemia Panel: No results for input(s): VITAMINB12, FOLATE, FERRITIN, TIBC, IRON, RETICCTPCT in the last 72 hours. Urine analysis:    Component Value Date/Time   COLORURINE STRAW (A) 09/10/2016 2210   APPEARANCEUR  CLEAR 09/10/2016 2210   LABSPEC 1.008 09/10/2016 2210   PHURINE 5.0 09/10/2016 2210   GLUCOSEU NEGATIVE 09/10/2016 2210   HGBUR SMALL (A) 09/10/2016 2210   BILIRUBINUR NEGATIVE 09/10/2016 2210   KETONESUR NEGATIVE 09/10/2016 2210   PROTEINUR NEGATIVE 09/10/2016 2210   UROBILINOGEN 0.2 06/12/2012 1559   NITRITE NEGATIVE 09/10/2016 2210   LEUKOCYTESUR MODERATE (A) 09/10/2016 2210   Sepsis Labs: @LABRCNTIP (procalcitonin:4,lacticidven:4) )No results found for this or any previous visit (from the past 240 hour(s)).   Radiological Exams on Admission: Ct Abdomen Pelvis W Contrast  Result Date: 09/11/2016 CLINICAL DATA:  LEFT upper quadrant pain, elevated lipase. History of substance abuse, cervical cancer and hysterectomy. EXAM: CT ABDOMEN AND PELVIS WITH CONTRAST TECHNIQUE: Multidetector CT imaging of the abdomen and pelvis was performed using the standard protocol following bolus administration of intravenous contrast. CONTRAST:  144mL ISOVUE-300 IOPAMIDOL (ISOVUE-300) INJECTION 61% COMPARISON:  CT abdomen and pelvis October 31, 2011 and MRI of the abdomen June 22, 2011 FINDINGS: LOWER CHEST: Lung bases are clear. Included heart size is normal. No pericardial effusion. HEPATOBILIARY: Liver and gallbladder are normal. PANCREAS: Pancreas is mildly enlarged edematous with peripancreatic fat stranding. Homogeneous enhancement of the pancreas without pseudocyst, ductal dilatation, mass or calcifications. SPLEEN: Normal. ADRENALS/URINARY TRACT: Kidneys are orthotopic, demonstrating symmetric enhancement. No nephrolithiasis, hydronephrosis or solid renal masses. LEFT renal cysts measuring to 2.5 cm, characterized as benign on prior MRI. The unopacified ureters are normal in course and caliber. Too small to characterize hypodensity RIGHT kidney. Delayed imaging through the kidneys demonstrates symmetric prompt contrast excretion within the proximal urinary collecting system. Urinary bladder is well distended  and unremarkable. Normal adrenal glands. STOMACH/BOWEL: The stomach, small and large bowel are normal in course and caliber without inflammatory changes. Normal appendix. VASCULAR/LYMPHATIC: Aortoiliac vessels are normal in course and caliber, mild calcific atherosclerosis. No lymphadenopathy by CT size criteria. REPRODUCTIVE: Status post hysterectomy. OTHER: No intraperitoneal free fluid or free air. MUSCULOSKELETAL: Nonacute. Grade 1 L4-5 anterolisthesis. Severe L5-S1 degenerative disc and lower lumbar facet arthropathy. Tiny fat containing umbilical hernia. IMPRESSION: Acute pancreatitis without necrosis or complication. Electronically Signed   By: Elon Alas M.D.   On: 09/11/2016 01:13  Assessment/Plan Principal Problem:   Acute pancreatitis Active Problems:   Essential hypertension    1. Acute pancreatitis - cause not clear. Patient does not drink alcohol and CT scan does not show any gallstones. Will check triglyceride levels. Other causes could be medication induced. Patient was recently on prolonged dose of antibiotics and also is on lisinopril. IV fluids nothing by mouth and pain relief medications. 2. Possible UTI -  follow urine cultures. Patient is on ceftriaxone.  3. Hypertension - we'll keep patient on when necessary IV hydralazine for now. 4. History of depression presently nothing by mouth. 5. Tobacco abuse - tobacco cessation counseling requested.  6.  Gum infection - was recently on prolonged antibiotic course. Patient is presently on ceftriaxone. 7. Acute renal failure - probably from poor oral intake. Creatinine has increased from last August. Follow metabolic panel after hydration.   DVT prophylaxis: SCDs. Code Status: Full code.  Family Communication: Discussed with patient.  Disposition Plan: Home.  Consults called: None.  Admission status: Observation.    Rise Patience MD [facility] Pager [phone].  If 7PM-7AM, please contact  night-coverage [email or web address] Password [email or web address]  09/11/2016, 3:16 AM

## 2016-09-11 NOTE — ED Notes (Signed)
Patient transported to CT 

## 2016-09-11 NOTE — Progress Notes (Signed)
Patient admitted after midnight.  Seen and examined.  States she has never had pancreatitis before.  She has only been taking two medications for the last month, including gabapentin and her antibiotic amoxicillin.  HCTZ is on her home medication list but since she has not been taking it for the last month, seems less likely that this medication caused her pancreatitis.  No evidence of stones on her CT scan.  Triglycerides are normal.  Spoke with GI regarding possible follow up for idiopathic pancreatitis.  States she continues to have severe abdominal pain in a "circle" pattern around her upper abdomen.  She also states she may have had some rectal bleeding when her symptoms first started followed by some black tarry stools, which have since resolved.  She has had normal BMs since then.  Still has little appetite, severe headache, and nausea.  VSS.  GEN:  Adult female, NAD.  CV:  RRR, no mrg. PULM::  CTAB.  ABD:  NABS, soft, nondistended, TTP in the epigastrium, RUQ and LUQ with epigastric area being the most severe pain.  No rebound or guarding.  Will continue IVF and advance diet as tolerated.  Plan to follow up with gastroenterology as outpatient.

## 2016-09-11 NOTE — Progress Notes (Signed)
Received report from ER RN. Room ready.

## 2016-09-11 NOTE — Progress Notes (Signed)
New Admission Note:  Arrival Method:Stretcher  Mental Orientation:Alert and oriented x 4 Telemetry: Box 24 NSR Assessment: Completed Skin: Warm and dry  IV: NSL  Pain: 8/10 abdomen  Tubes: N/A  Safety Measures: Safety Fall Prevention Plan was given, discussed and signed. Admission: Completed [address] Orientation: Patient has been orientated to the room, unit and the staff. Family: None   Orders have been reviewed and implemented. Will continue to monitor the patient. Call light has been placed within reach and bed alarm has been activated.   Sima Matas BSN, RN  Phone Number: [phone]

## 2016-09-11 NOTE — Progress Notes (Signed)
Pharmacy Antibiotic Note  Lori [name] is a 61 y.o. female admitted on 09/10/2016 with UTI.  Pharmacy has been consulted for Rocephin dosing.  Plan: Rocephin 1gm IV q24h Pharmacy will sign off - please reconsult if needed  Height: 5\' 2"  (157.5 cm) Weight: 170 lb (77.1 kg) IBW/kg (Calculated) : 50.1  Temp (24hrs), Avg:98.6 F (37 C), Min:98.6 F (37 C), Max:98.6 F (37 C)   Recent Labs Lab 09/10/16 2209  WBC 12.3*  CREATININE 1.14*    Estimated Creatinine Clearance: 50.5 mL/min (A) (by C-G formula based on SCr of 1.14 mg/dL (H)).    No Known Allergies  Thank you for allowing pharmacy to be a part of this patient's care.  Sherlon Handing, PharmD, BCPS Clinical pharmacist, pager [phone] 09/11/2016 3:24 AM

## 2016-09-12 DIAGNOSIS — E871 Hypo-osmolality and hyponatremia: Secondary | ICD-10-CM

## 2016-09-12 DIAGNOSIS — D649 Anemia, unspecified: Secondary | ICD-10-CM

## 2016-09-12 DIAGNOSIS — K029 Dental caries, unspecified: Secondary | ICD-10-CM

## 2016-09-12 LAB — COMPREHENSIVE METABOLIC PANEL
ALT: 9 U/L — ABNORMAL LOW (ref 14–54)
AST: 16 U/L (ref 15–41)
Albumin: 3.2 g/dL — ABNORMAL LOW (ref 3.5–5.0)
Alkaline Phosphatase: 77 U/L (ref 38–126)
Anion gap: 11 (ref 5–15)
BUN: 9 mg/dL (ref 6–20)
CO2: 21 mmol/L — ABNORMAL LOW (ref 22–32)
Calcium: 9 mg/dL (ref 8.9–10.3)
Chloride: 108 mmol/L (ref 101–111)
Creatinine, Ser: 0.9 mg/dL (ref 0.44–1.00)
GFR calc Af Amer: >60 mL/min (ref >60)
GFR calc non Af Amer: >60 mL/min (ref >60)
Glucose, Bld: 99 mg/dL (ref 65–99)
Potassium: 3.9 mmol/L (ref 3.5–5.1)
Sodium: 140 mmol/L (ref 135–145)
Total Bilirubin: 0.7 mg/dL (ref 0.3–1.2)
Total Protein: 7 g/dL (ref 6.5–8.1)

## 2016-09-12 LAB — CBC
HCT: 31.6 % — ABNORMAL LOW (ref 36.0–46.0)
Hemoglobin: 10.1 g/dL — ABNORMAL LOW (ref 12.0–15.0)
MCH: 30.9 pg (ref 26.0–34.0)
MCHC: 32 g/dL (ref 30.0–36.0)
MCV: 96.6 fL (ref 78.0–100.0)
Platelets: 298 10*3/uL (ref 150–400)
RBC: 3.27 MIL/uL — ABNORMAL LOW (ref 3.87–5.11)
RDW: 13.3 % (ref 11.5–15.5)
WBC: 8.5 10*3/uL (ref 4.0–10.5)

## 2016-09-12 LAB — URINE CULTURE

## 2016-09-12 MED ORDER — DEXTROSE 5 % IV SOLN
1.0000 g | Freq: Every day | INTRAVENOUS | Status: DC
  Filled 2016-09-12: qty 10

## 2016-09-12 MED ORDER — POLYETHYLENE GLYCOL 3350 17 GM/SCOOP PO POWD: 17.0000 g | Freq: Every day | ORAL | 0 refills | Status: DC | PRN

## 2016-09-12 MED ORDER — OXYCODONE HCL 5 MG PO TABS: 5.0000 mg | ORAL_TABLET | ORAL | 0 refills | Status: DC | PRN

## 2016-09-12 MED ORDER — OXYCODONE HCL 5 MG PO TABS
5.0000 mg | ORAL_TABLET | ORAL | Status: DC | PRN
  Administered 2016-09-12 (×2): 5 mg via ORAL
  Filled 2016-09-12 (×2): qty 1

## 2016-09-12 MED ORDER — MORPHINE SULFATE (PF) 4 MG/ML IV SOLN
1.0000 mg | INTRAVENOUS | Status: DC | PRN
Start: 2016-09-12 — End: 2016-09-12

## 2016-09-12 MED ORDER — OXYCODONE HCL 5 MG PO TABS: 5.0000 mg | ORAL_TABLET | ORAL | Status: DC | PRN

## 2016-09-12 MED ORDER — ONDANSETRON HCL 4 MG PO TABS: 4.0000 mg | ORAL_TABLET | Freq: Four times a day (QID) | ORAL | 0 refills | Status: DC | PRN

## 2016-09-12 NOTE — Discharge Summary (Signed)
Physician Discharge Summary  Lori [name] ZOX:[id number] DOB: July 15, 1955 DOA: 09/10/2016  PCP: Alvester Chou, NP  Admit date: 09/10/2016 Discharge date: 09/12/2016  Admitted From: home  Disposition:  home  Recommendations for Outpatient Follow-up:  1. Follow up with PCP in 1-2 weeks 2. Please do not refill HCTZ.    Home Health:  none  Equipment/Devices:  None  Discharge Condition:  Stable, improved CODE STATUS:  Full code  Diet recommendation:  Low fat diet, advance slowly from full liquids to bland foods   Brief/Interim Summary:  Lori [name] is a 61 y.o. female with history of hypertension, depression, breast cancer in remission, tobacco abuse presented to the ER with complaints of progressive persistent abdominal pain over the past2 weeks.   Patient has been recently on antibiotics for tooth and gum infection and has been on ampicillin for almost 6-7 weeks.  CT of the abdomen and pelvis shows features consistent with pancreatitis and her lipase was elevated.  LFTs were normal.  HCTZ is on her home medication list but since she has not been taking it for the last month, seems less likely that this medication caused her pancreatitis.  No evidence of stones on her CT scan.  Triglycerides were normal.  No family history of pancreatitis.  She improved with pain medication, antiemetics and IVF and was able to tolerate a full liquids diet and oral pain medications prior to discharge.  She felt ready to go home.  She will follow up with her PCP for refills of her chronic medications and with gastroenterology for idiopathic pancreatitis.    Discharge Diagnoses:  Principal Problem:   Acute pancreatitis Active Problems:   TOBACCO ABUSE   Depression with anxiety   Essential hypertension   Caries   Neuropathy of both feet   Normocytic anemia   Hyponatremia  Acute idiopathic pancreatitis, no evidence of gallstones, recent culprit medications, EtOH, or high triglycerides -  Push fluids at home -   Oxycodone and antiemetics prescribed for home -  Advised to eat low fat diet -  f/u with gastroenterology  Possible UTI/cystitis with suprapubic discomfort and some dysuria, present at time of admission urine culture grew mixed flora.  -  Given 3 doses of ceftriaxone during admission  Hypertension, ran out of blood pressure medication a month ago  Depression, ran out of medication a month ago  Neuropathy, continue gabapentin  Tobacco abuse, given nicotine patch.    Gingivitis, periodontal abscess -  Continue amoxicillin per prescriber recommendations  Creatinine trended down with hydration.  Did not meet criteria for AKI.    Hyponatremia due to dehydration, resolved with IVF  Normocytic anemia, likely due to acute illness and IVF.  No evidence of hemorrhage.  Recommend follow up with PCP for repeat CBC and additional work up for anemia if persistent.   Discharge Instructions  Discharge Instructions    Call MD for:  difficulty breathing, headache or visual disturbances    Complete by:  As directed    Call MD for:  extreme fatigue    Complete by:  As directed    Call MD for:  hives    Complete by:  As directed    Call MD for:  persistant dizziness or light-headedness    Complete by:  As directed    Call MD for:  persistant nausea and vomiting    Complete by:  As directed    Call MD for:  severe uncontrolled pain    Complete by:  As directed  Call MD for:  temperature >100.4    Complete by:  As directed    Diet - low sodium heart healthy    Complete by:  As directed    Increase activity slowly    Complete by:  As directed        Medication List    STOP taking these medications   fluconazole 150 MG tablet Commonly known as:  DIFLUCAN   fluticasone 50 MCG/ACT nasal spray Commonly known as:  FLONASE   hydrochlorothiazide 12.5 MG capsule Commonly known as:  MICROZIDE   HYDROcodone-acetaminophen 5-325 MG tablet Commonly known as:  NORCO/VICODIN   ibuprofen  800 MG tablet Commonly known as:  ADVIL,MOTRIN   loratadine 10 MG tablet Commonly known as:  CLARITIN   nicotine 14 mg/24hr patch Commonly known as:  NICODERM CQ - dosed in mg/24 hours   saccharomyces boulardii 250 MG capsule Commonly known as:  FLORASTOR     TAKE these medications   amoxicillin 500 MG capsule Commonly known as:  AMOXIL Take 500 mg by mouth 3 (three) times daily.   clotrimazole 1 % cream Commonly known as:  LOTRIMIN Apply to affected area 2 times daily   diphenhydramine-acetaminophen 25-500 MG Tabs tablet Commonly known as:  TYLENOL PM Take 1-2 tablets by mouth at bedtime as needed (sleep).   FLUoxetine 40 MG capsule Commonly known as:  PROZAC TAKE 1 CAPSULE (40 MG TOTAL) BY MOUTH DAILY.   gabapentin 300 MG capsule Commonly known as:  NEURONTIN TAKE 1 CAPSULE (300 MG TOTAL) BY MOUTH AT BEDTIME.   lisinopril 20 MG tablet Commonly known as:  PRINIVIL,ZESTRIL TAKE 1 TABLET (20 MG TOTAL) BY MOUTH DAILY.   ondansetron 4 MG tablet Commonly known as:  ZOFRAN Take 1 tablet (4 mg total) by mouth every 6 (six) hours as needed for nausea. What changed:  when to take this  reasons to take this   oxyCODONE 5 MG immediate release tablet Commonly known as:  Oxy IR/ROXICODONE Take 1 tablet (5 mg total) by mouth every 4 (four) hours as needed for moderate pain or severe pain.   polyethylene glycol powder powder Commonly known as:  MIRALAX Take 17 g by mouth daily as needed for mild constipation.      Follow-up Information    Milus Banister, MD Follow up on 10/17/2016.   Specialty:  Gastroenterology Why:  10 AM.  follow up with GI MD regarding recent pancreatitis of unclear cause.  Contact information: [address] [place] [address] [phone]        Alvester Chou, NP. Schedule an appointment as soon as possible for a visit in 1 week(s).   Specialty:  Nurse Practitioner Contact information: [facility] [place] [address] [phone]          No Known Allergies  Consultations: none   Procedures/Studies: Ct Abdomen Pelvis W Contrast  Result Date: 09/11/2016 CLINICAL DATA:  LEFT upper quadrant pain, elevated lipase. History of substance abuse, cervical cancer and hysterectomy. EXAM: CT ABDOMEN AND PELVIS WITH CONTRAST TECHNIQUE: Multidetector CT imaging of the abdomen and pelvis was performed using the standard protocol following bolus administration of intravenous contrast. CONTRAST:  172mL ISOVUE-300 IOPAMIDOL (ISOVUE-300) INJECTION 61% COMPARISON:  CT abdomen and pelvis October 31, 2011 and MRI of the abdomen June 22, 2011 FINDINGS: LOWER CHEST: Lung bases are clear. Included heart size is normal. No pericardial effusion. HEPATOBILIARY: Liver and gallbladder are normal. PANCREAS: Pancreas is mildly  enlarged edematous with peripancreatic fat stranding. Homogeneous enhancement of the pancreas without pseudocyst, ductal dilatation, mass or calcifications. SPLEEN: Normal. ADRENALS/URINARY TRACT: Kidneys are orthotopic, demonstrating symmetric enhancement. No nephrolithiasis, hydronephrosis or solid renal masses. LEFT renal cysts measuring to 2.5 cm, characterized as benign on prior MRI. The unopacified ureters are normal in course and caliber. Too small to characterize hypodensity RIGHT kidney. Delayed imaging through the kidneys demonstrates symmetric prompt contrast excretion within the proximal urinary collecting system. Urinary bladder is well distended and unremarkable. Normal adrenal glands. STOMACH/BOWEL: The stomach, small and large bowel are normal in course and caliber without inflammatory changes. Normal appendix. VASCULAR/LYMPHATIC: Aortoiliac vessels are normal in course and caliber, mild calcific atherosclerosis. No lymphadenopathy by CT size criteria. REPRODUCTIVE: Status post hysterectomy. OTHER: No intraperitoneal free fluid or free air. MUSCULOSKELETAL: Nonacute. Grade 1 L4-5  anterolisthesis. Severe L5-S1 degenerative disc and lower lumbar facet arthropathy. Tiny fat containing umbilical hernia. IMPRESSION: Acute pancreatitis without necrosis or complication. Electronically Signed   By: Elon Alas M.D.   On: 09/11/2016 01:13      Subjective: Abdominal pain continues to improve.  Oral pain medication helps her pain better than the IV pain medication.  Having nausea every time she eats and is preferring to eat a full liquids diet for now because her pain worsens when she eats solid foods.  Feels that she is eating and drinking enough to stay hydrated.  Nausea medication helps.    Discharge Exam: Vitals:   09/12/16 0446 09/12/16 1000  BP: 127/61 (!) 117/52  Pulse: 82 82  Resp: 18 18  Temp: 97.8 F (36.6 C) 97.7 F (36.5 C)   Vitals:   09/11/16 1700 09/11/16 2131 09/12/16 0446 09/12/16 1000  BP: 132/68 128/65 127/61 (!) 117/52  Pulse: 88 89 82 82  Resp: 18 18 18 18   Temp: 98 F (36.7 C) 98.8 F (37.1 C) 97.8 F (36.6 C) 97.7 F (36.5 C)  TempSrc: Oral Oral Oral Oral  SpO2: 100% 96% 97% 98%  Weight:  78.2 kg (172 lb 6.4 oz)    Height:        General: Pt is alert, awake, not in acute distress Cardiovascular: RRR, S1/S2 +, no rubs, no gallops Respiratory: CTA bilaterally, no wheezing, no rhonchi Abdominal:   NABS, soft, mildly distended, TTP in the epigastrium with moderate palpation, no rebound or guarding Extremities: no edema, no cyanosis    The results of significant diagnostics from this hospitalization (including imaging, microbiology, ancillary and laboratory) are listed below for reference.     Microbiology: Recent Results (from the past 240 hour(s))  Urine culture     Status: Abnormal   Collection Time: 09/10/16 10:09 PM  Result Value Ref Range Status   Specimen Description URINE, RANDOM  Final   Special Requests NONE  Final   Culture MULTIPLE SPECIES PRESENT, SUGGEST RECOLLECTION (A)  Final   Report Status 09/12/2016 FINAL   Final     Labs: BNP (last 3 results) No results for input(s): BNP in the last 8760 hours. Basic Metabolic Panel:  Recent Labs Lab 09/10/16 2209 09/11/16 0403 09/12/16 0528  NA 133* 138 140  K 4.0 4.1 3.9  CL 105 106 108  CO2 17* 24 21*  GLUCOSE 112* 102* 99  BUN 17 13 9   CREATININE 1.14* 1.02* 0.90  CALCIUM 9.8 9.2 9.0   Liver Function Tests:  Recent Labs Lab 09/10/16 2209 09/11/16 0403 09/12/16 0528  AST 20 19 16   ALT 12* 10* 9*  ALKPHOS 97 85 77  BILITOT 0.7 0.7 0.7  PROT 8.8* 7.7 7.0  ALBUMIN 4.1 3.7 3.2*    Recent Labs Lab 09/10/16 2209 09/11/16 0403  LIPASE 318* 208*   No results for input(s): AMMONIA in the last 168 hours. CBC:  Recent Labs Lab 09/10/16 2209 09/11/16 0403 09/12/16 0528  WBC 12.3* 8.6 8.5  NEUTROABS  --  5.5  --   HGB 12.2 11.2* 10.1*  HCT 36.5 34.7* 31.6*  MCV 95.3 95.9 96.6  PLT 372 320 298   Cardiac Enzymes:  Recent Labs Lab 09/11/16 0635  TROPONINI <0.03   BNP: Invalid input(s): POCBNP CBG: No results for input(s): GLUCAP in the last 168 hours. D-Dimer No results for input(s): DDIMER in the last 72 hours. Hgb A1c No results for input(s): HGBA1C in the last 72 hours. Lipid Profile  Recent Labs  09/11/16 0403  TRIG 106   Thyroid function studies No results for input(s): TSH, T4TOTAL, T3FREE, THYROIDAB in the last 72 hours.  Invalid input(s): FREET3 Anemia work up No results for input(s): VITAMINB12, FOLATE, FERRITIN, TIBC, IRON, RETICCTPCT in the last 72 hours. Urinalysis    Component Value Date/Time   COLORURINE STRAW (A) 09/10/2016 2210   APPEARANCEUR CLEAR 09/10/2016 2210   LABSPEC 1.008 09/10/2016 2210   PHURINE 5.0 09/10/2016 2210   GLUCOSEU NEGATIVE 09/10/2016 2210   HGBUR SMALL (A) 09/10/2016 2210   BILIRUBINUR NEGATIVE 09/10/2016 2210   KETONESUR NEGATIVE 09/10/2016 2210   PROTEINUR NEGATIVE 09/10/2016 2210   UROBILINOGEN 0.2 06/12/2012 1559   NITRITE NEGATIVE 09/10/2016 2210    LEUKOCYTESUR MODERATE (A) 09/10/2016 2210   Sepsis Labs Invalid input(s): PROCALCITONIN,  WBC,  LACTICIDVEN   Time coordinating discharge: Over 30 minutes  SIGNED:   Janece Canterbury, MD  [facility] 09/12/2016, 2:48 PM Pager   If 7PM-7AM, please contact night-coverage [email or web address] Password [email or web address]

## 2016-09-12 NOTE — Progress Notes (Signed)
Patient discharge teaching given, including activity, diet, follow-up appoints, and medications. Patient verbalized understanding of all discharge instructions. IV access was d/c'd. Vitals are stable. Skin is intact except as charted in most recent assessments. Pt to be escorted out by NT, to be driven home by family.  Romolo Sieling, MBA, BSN, RN 

## 2016-09-14 ENCOUNTER — Ambulatory Visit
Admission: RE | Admit: 2016-09-14 | Discharge: 2016-09-14 | Disposition: A | Discharge status: Home / Self Care | Payer: Medicaid Other | Source: Ambulatory Visit | Attending: Adult Health | Admitting: Adult Health

## 2016-09-14 ENCOUNTER — Other Ambulatory Visit: Payer: Self-pay | Admitting: Adult Health

## 2016-09-14 DIAGNOSIS — R109 Unspecified abdominal pain: Secondary | ICD-10-CM

## 2016-10-03 ENCOUNTER — Other Ambulatory Visit: Payer: Self-pay | Admitting: Internal Medicine

## 2016-10-03 DIAGNOSIS — N63 Unspecified lump in unspecified breast: Secondary | ICD-10-CM

## 2016-10-05 ENCOUNTER — Other Ambulatory Visit: Payer: Self-pay | Admitting: Adult Health

## 2016-10-05 ENCOUNTER — Other Ambulatory Visit: Payer: Self-pay | Admitting: Internal Medicine

## 2016-10-05 ENCOUNTER — Other Ambulatory Visit: Payer: Self-pay | Admitting: Student

## 2016-10-05 DIAGNOSIS — N63 Unspecified lump in unspecified breast: Secondary | ICD-10-CM

## 2016-10-16 ENCOUNTER — Encounter (INDEPENDENT_AMBULATORY_CARE_PROVIDER_SITE_OTHER): Payer: Self-pay | Admitting: Orthopedic Surgery

## 2016-10-16 ENCOUNTER — Ambulatory Visit (INDEPENDENT_AMBULATORY_CARE_PROVIDER_SITE_OTHER): Payer: Medicaid Other | Admitting: Orthopedic Surgery

## 2016-10-16 VITALS — Ht 62.0 in | Wt 172.0 lb

## 2016-10-16 DIAGNOSIS — M79671 Pain in right foot: Secondary | ICD-10-CM | POA: Diagnosis not present

## 2016-10-16 DIAGNOSIS — M25571 Pain in right ankle and joints of right foot: Secondary | ICD-10-CM | POA: Diagnosis not present

## 2016-10-16 DIAGNOSIS — M25572 Pain in left ankle and joints of left foot: Secondary | ICD-10-CM

## 2016-10-16 DIAGNOSIS — M79672 Pain in left foot: Secondary | ICD-10-CM | POA: Diagnosis not present

## 2016-10-16 MED ORDER — PREDNISONE 10 MG PO TABS: 10.0000 mg | ORAL_TABLET | Freq: Every day | ORAL | 0 refills | Status: DC

## 2016-10-16 NOTE — Progress Notes (Signed)
Office Visit Note   Patient: Lori [name]           Date of Birth: 10-31-1955           MRN: [id number] Visit Date: 10/16/2016              Requested by: Alvester Chou, NP [facility] [address] [place], [place] [address] PCP: Alvester Chou, NP  Chief Complaint  Patient presents with  . Left Knee - Pain  . Right Knee - Pain  . Right Ankle - Pain  . Left Ankle - Pain  . Right Foot - Pain  . Left Foot - Pain      HPI: Patient presents in follow-up for both lower extremities. Patient states that her calf pain has completely resolved with using the [place] water. She states she now has pain in both ankles and both feet. She states that her feet and ankle throb when she tries to walk. She states the right knee is not helping her.  Patient states she has a history of pancreatitis and kidney swelling. She states she now has some axillary lymph nodes that are swollen on both axilla and has a breast mass that she is going to undergo an ultrasound biopsy. She states she was told not to take anti-inflammatories due to her pancreatitis and kidney swelling. She states that she just had multiple teeth pulled from infection.  Assessment & Plan: Visit Diagnoses:  1. Acute bilateral ankle pain   2. Bilateral foot pain     Plan: We will try low-dose of prednisone. Patient has no signs or symptoms of infection.  Follow-Up Instructions: Return in about 2 weeks (around 10/30/2016).   Ortho Exam  Patient is alert, oriented, no adenopathy, well-dressed, normal affect, normal respiratory effort. Examination patient is tearful with her medical conditions. She has an antalgic gait. Examination of her calf and tibia are nontender to palpation she is completely resolve the periostitis. Examination she has no swelling in her foot or ankle there is no redness there were no ulcers she does have tenderness to palpation over the ankle and over the metatarsal heads bilaterally. She has good  ankle and subtalar motion. She has no ulnar deviation of the MCP joint of her hands and no swelling of the PIP or DIP joints.  Imaging: No results found.  Labs: Lab Results  Component Value Date   HGBA1C 5.5 07/02/2015   HGBA1C 5.5 05/10/2011   REPTSTATUS 09/12/2016 FINAL 09/10/2016   CULT MULTIPLE SPECIES PRESENT, SUGGEST RECOLLECTION (A) 09/10/2016    Orders:  No orders of the defined types were placed in this encounter.  Meds ordered this encounter  Medications  . predniSONE (DELTASONE) 10 MG tablet    Sig: Take 1 tablet (10 mg total) by mouth daily with breakfast.    Dispense:  30 tablet    Refill:  0     Procedures: No procedures performed  Clinical Data: No additional findings.  ROS:  All other systems negative, except as noted in the HPI. Review of Systems  Objective: Vital Signs: Ht 5\' 2"  (1.575 m)   Wt 172 lb (78 kg)   BMI 31.46 kg/m   Specialty Comments:  No specialty comments available.  PMFS History: Patient Active Problem List   Diagnosis Date Noted  . Normocytic anemia 09/12/2016  . Hyponatremia 09/12/2016  . Acute pancreatitis 09/11/2016  . Shin splints, sequela 08/16/2016  . Cellulitis of groin 01/05/2016  . Encounter for  screening mammogram for breast cancer 07/02/2015  . 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 Allergic rhinitis 11/27/2014  . Otitis media of right ear 07/01/2012  . Cough 11/20/2011  . Renal lesion 06/12/2011  . Nausea 05/10/2011  . Caries 04/12/2011  . Abdominal pain 10/25/2010  . Preventive measure 10/25/2010  . Shoulder pain, left 10/25/2010  . Leg pain 06/20/2010  . CERVICAL CANCER 10/23/2008  . TOBACCO ABUSE 10/23/2008  . Depression with anxiety 10/23/2008  . Essential hypertension 10/23/2008  . GERD 10/23/2008  . Insomnia 10/23/2008  . SNORING 10/23/2008   Past Medical History:  Diagnosis Date  . Arthritis   . Cancer ([place])   . Depression   . Depression with anxiety 2008/08/14   Patient had multiple death in  her family over a short period of time. father, brother, uncle and niece who was stabbed 3 years ago. Patient's husband had a car accident last year and  need currently a feeding tube.   Marland Kitchen GERD (gastroesophageal reflux disease)   . Headache(784.0)   . History of cervical cancer  1985    status post partial hysterectomy , last Pap smear 10 years ago , no further followup  . History of cocaine abuse 10/23/2008    last documented in 2005-08-14 when admitted for hypertensive urgency  . Hypertension 08/14/2005   Admitted for HTN crisis in 10/2008. Was given in 2008-08-14 a wrong prescription from the pharmacy and per ED note it was Lisinopril-Hydrocholorthizide which gave her a  hives and feeling of sickness. Patient was started  on this meds on 05/31/2010 by Dr  Ihor Gully without any problem.   . Insomnia   . Tobacco abuse     35 years    Family History  Problem Relation Age of Onset  . Thyroid disease Mother   . Diabetes Father   . Heart disease Father   . Hyperlipidemia Father   . Hypertension Father   . Diabetes Brother   . Hypertension Brother   . Hyperlipidemia Brother   . Colon polyps Brother   . Thyroid disease Daughter   . Obesity Daughter   . Mental illness Daughter   . Colon cancer Maternal Uncle     Past Surgical History:  Procedure Laterality Date  . ABDOMINAL HYSTERECTOMY    . CESAREAN SECTION  1985  . SKIN GRAFT     Social History   Occupational History  . Not on file.   Social History Main Topics  . Smoking status: Current Some Day Smoker    Packs/day: 0.50    Years: 43.00    Types: Cigarettes  . Smokeless tobacco: Never Used     Comment: patches have helped in the past  . Alcohol use No  . Drug use: No  . Sexual activity: Not on file

## 2016-10-17 ENCOUNTER — Ambulatory Visit: Payer: Medicaid Other | Admitting: Gastroenterology

## 2016-10-23 ENCOUNTER — Ambulatory Visit: Payer: Medicaid Other | Admitting: Gastroenterology

## 2016-10-30 ENCOUNTER — Ambulatory Visit (INDEPENDENT_AMBULATORY_CARE_PROVIDER_SITE_OTHER): Payer: Medicaid Other | Admitting: Orthopedic Surgery

## 2016-11-05 ENCOUNTER — Other Ambulatory Visit (INDEPENDENT_AMBULATORY_CARE_PROVIDER_SITE_OTHER): Payer: Self-pay | Admitting: Orthopedic Surgery

## 2016-11-07 ENCOUNTER — Other Ambulatory Visit: Payer: Medicaid Other

## 2016-11-13 ENCOUNTER — Ambulatory Visit
Admission: RE | Admit: 2016-11-13 | Discharge: 2016-11-13 | Disposition: A | Discharge status: Home / Self Care | Payer: Medicaid Other | Source: Ambulatory Visit | Attending: Adult Health | Admitting: Adult Health

## 2016-11-13 ENCOUNTER — Other Ambulatory Visit: Payer: Self-pay | Admitting: Adult Health

## 2016-11-13 DIAGNOSIS — R599 Enlarged lymph nodes, unspecified: Secondary | ICD-10-CM

## 2016-11-13 DIAGNOSIS — N63 Unspecified lump in unspecified breast: Secondary | ICD-10-CM

## 2016-11-15 ENCOUNTER — Telehealth (INDEPENDENT_AMBULATORY_CARE_PROVIDER_SITE_OTHER): Payer: Self-pay | Admitting: Orthopedic Surgery

## 2016-11-15 NOTE — Telephone Encounter (Signed)
Returned call to patient left message to call back  [phone]

## 2016-11-16 ENCOUNTER — Ambulatory Visit (INDEPENDENT_AMBULATORY_CARE_PROVIDER_SITE_OTHER): Payer: Medicaid Other | Admitting: Orthopedic Surgery

## 2016-11-29 ENCOUNTER — Ambulatory Visit (INDEPENDENT_AMBULATORY_CARE_PROVIDER_SITE_OTHER): Payer: Medicaid Other | Admitting: Orthopedic Surgery

## 2016-12-03 ENCOUNTER — Other Ambulatory Visit (INDEPENDENT_AMBULATORY_CARE_PROVIDER_SITE_OTHER): Payer: Self-pay | Admitting: Orthopedic Surgery

## 2016-12-04 ENCOUNTER — Ambulatory Visit: Payer: Medicaid Other | Admitting: Gastroenterology

## 2016-12-04 ENCOUNTER — Telehealth: Payer: Self-pay | Admitting: Gastroenterology

## 2016-12-04 NOTE — Telephone Encounter (Signed)
Ok, thanks.

## 2016-12-04 NOTE — Telephone Encounter (Signed)
Do you want to charge? 

## 2016-12-13 ENCOUNTER — Ambulatory Visit (INDEPENDENT_AMBULATORY_CARE_PROVIDER_SITE_OTHER): Payer: Medicaid Other | Admitting: Orthopedic Surgery

## 2016-12-13 DIAGNOSIS — M25571 Pain in right ankle and joints of right foot: Secondary | ICD-10-CM

## 2016-12-13 DIAGNOSIS — M25562 Pain in left knee: Secondary | ICD-10-CM

## 2016-12-13 DIAGNOSIS — M25572 Pain in left ankle and joints of left foot: Secondary | ICD-10-CM

## 2016-12-13 DIAGNOSIS — M25561 Pain in right knee: Secondary | ICD-10-CM

## 2016-12-14 ENCOUNTER — Other Ambulatory Visit (INDEPENDENT_AMBULATORY_CARE_PROVIDER_SITE_OTHER): Payer: Self-pay | Admitting: Orthopedic Surgery

## 2016-12-14 LAB — CBC
HCT: 35.3 % (ref 35.0–45.0)
Hemoglobin: 11.7 g/dL (ref 11.7–15.5)
MCH: 31.5 pg (ref 27.0–33.0)
MCHC: 33.1 g/dL (ref 32.0–36.0)
MCV: 95.1 fL (ref 80.0–100.0)
MPV: 9.6 fL (ref 7.5–12.5)
Platelets: 349 10*3/uL (ref 140–400)
RBC: 3.71 MIL/uL — ABNORMAL LOW (ref 3.80–5.10)
RDW: 14.2 % (ref 11.0–15.0)
WBC: 8.4 10*3/uL (ref 3.8–10.8)

## 2016-12-14 LAB — URIC ACID: Uric Acid, Serum: 6.7 mg/dL (ref 2.5–7.0)

## 2016-12-14 NOTE — Progress Notes (Signed)
Office Visit Note   Patient: Lori [name]           Date of Birth: 1955/05/23           MRN: [id number] Visit Date: 12/13/2016              Requested by: Alvester Chou, NP [facility] [address] [place], [place] [address] PCP: Alvester Chou, NP  No chief complaint on file.     HPI: Patient is a 61 year old woman who states that the 5 mg prednisone helped for quite a while for the arthritis pain in both lower extremities she states it worked for about 6 weeks she states the pain has returned at this time complains of bilateral knee pain and bilateral ankle pain. Patient states she's going to the beach and with need to increase her prednisone so she can enjoy her trip. Patient currently ambulate and flip-flops.  Patient states she cannot take nonsteroidals due to her pancreatitis. She states the pain is worse anteriorly over both ankles.  Assessment & Plan: Visit Diagnoses:  1. Acute bilateral ankle pain   2. Acute pain of both knees     Plan: Prescription provided for prednisone 10 mg tablets to be taken every morning with breakfast. She is also given a prescription to go to [facility] for a rheumatology screen for ANA rheumatoid factor CBC and sedimentation rate.  Follow-Up Instructions: Return in about 4 weeks (around 01/10/2017).   Ortho Exam  Patient is alert, oriented, no adenopathy, well-dressed, normal affect, normal respiratory effort. Examination patient has an unsteady gait. She has no effusion of the knees or ankles. She has a negative straight leg raise bilaterally. No focal motor weakness in either lower extremity. Patient is globally tender to palpation around her knees legs and ankles. No signs of DVT.  Imaging: No results found.  Labs: Lab Results  Component Value Date   HGBA1C 5.5 07/02/2015   HGBA1C 5.5 05/10/2011   REPTSTATUS 09/12/2016 FINAL 09/10/2016   CULT MULTIPLE SPECIES PRESENT, SUGGEST RECOLLECTION (A) 09/10/2016    Orders:   No orders of the defined types were placed in this encounter.  No orders of the defined types were placed in this encounter.    Procedures: No procedures performed  Clinical Data: No additional findings.  ROS:  All other systems negative, except as noted in the HPI. Review of Systems  Objective: Vital Signs: There were no vitals taken for this visit.  Specialty Comments:  No specialty comments available.  PMFS History: Patient Active Problem List   Diagnosis Date Noted  . Normocytic anemia 09/12/2016  . Hyponatremia 09/12/2016  . Acute pancreatitis 09/11/2016  . Shin splints, sequela 08/16/2016  . Cellulitis of groin 01/05/2016  . Encounter for screening mammogram for breast cancer 07/02/2015  . 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 Allergic rhinitis 11/27/2014  . Otitis media of right ear 07/01/2012  . Cough 11/20/2011  . Renal lesion 06/12/2011  . Nausea 05/10/2011  . Caries 04/12/2011  . Abdominal pain 10/25/2010  . Preventive measure 10/25/2010  . Shoulder pain, left 10/25/2010  . Leg pain 06/20/2010  . CERVICAL CANCER 10/23/2008  . TOBACCO ABUSE 10/23/2008  . Depression with anxiety 10/23/2008  . Essential hypertension 10/23/2008  . GERD 10/23/2008  . Insomnia 10/23/2008  . SNORING 10/23/2008   Past Medical History:  Diagnosis Date  . Arthritis   . Cancer ([place])   . Depression   . Depression with anxiety 2010  Patient had multiple death in her family over a short period of time. father, brother, uncle and niece who was stabbed 3 years ago. Patient's husband had a car accident last year and  need currently a feeding tube.   Marland Kitchen GERD (gastroesophageal reflux disease)   . Headache(784.0)   . History of cervical cancer  1985    status post partial hysterectomy , last Pap smear 10 years ago , no further followup  . History of cocaine abuse 10/23/2008    last documented in 2007 when admitted for hypertensive urgency  . Hypertension 2007   Admitted for HTN  crisis in 10/2008. Was given in 2010 a wrong prescription from the pharmacy and per ED note it was Lisinopril-Hydrocholorthizide which gave her a  hives and feeling of sickness. Patient was started  on this meds on 05/31/2010 by Dr  Ihor Gully without any problem.   . Insomnia   . Tobacco abuse     35 years    Family History  Problem Relation Age of Onset  . Thyroid disease Mother   . Diabetes Father   . Heart disease Father   . Hyperlipidemia Father   . Hypertension Father   . Diabetes Brother   . Hypertension Brother   . Hyperlipidemia Brother   . Colon polyps Brother   . Thyroid disease Daughter   . Obesity Daughter   . Mental illness Daughter   . Colon cancer Maternal Uncle     Past Surgical History:  Procedure Laterality Date  . ABDOMINAL HYSTERECTOMY    . CESAREAN SECTION  1985  . SKIN GRAFT     Social History   Occupational History  . Not on file.   Social History Main Topics  . Smoking status: Current Some Day Smoker    Packs/day: 0.50    Years: 43.00    Types: Cigarettes  . Smokeless tobacco: Never Used     Comment: patches have helped in the past  . Alcohol use No  . Drug use: No  . Sexual activity: Not on file

## 2016-12-15 ENCOUNTER — Telehealth (INDEPENDENT_AMBULATORY_CARE_PROVIDER_SITE_OTHER): Payer: Self-pay

## 2016-12-15 ENCOUNTER — Other Ambulatory Visit (INDEPENDENT_AMBULATORY_CARE_PROVIDER_SITE_OTHER): Payer: Self-pay

## 2016-12-15 LAB — SEDIMENTATION RATE: Sed Rate: 20 mm/hr (ref 0–30)

## 2016-12-15 LAB — RHEUMATOID FACTOR: Rhuematoid fact SerPl-aCnc: <14 IU/mL (ref <14)

## 2016-12-15 LAB — ANA: Anti Nuclear Antibody(ANA): NEGATIVE

## 2016-12-15 MED ORDER — COLCHICINE 0.6 MG PO TABS: 0.6000 mg | ORAL_TABLET | Freq: Every day | ORAL | 3 refills | Status: DC

## 2016-12-15 NOTE — Telephone Encounter (Signed)
Patient returned call and I advised her of message concerning Prednisone.

## 2016-12-15 NOTE — Telephone Encounter (Signed)
Patient called wanting to know if she could get a different Rx prescribed instead of the Prednisone 10mg .  CB# is [phone].  Please advise.  Thank You.

## 2016-12-15 NOTE — Telephone Encounter (Signed)
I called and left message for the patient. Advised I'm not sure what else she could be written. Narcotics is inappropriate. She requested the prednisone at a higher dose herself at last office visit two days ago. She is unable to take any nsaids. Prednisone is probably her best option.

## 2016-12-19 ENCOUNTER — Other Ambulatory Visit (INDEPENDENT_AMBULATORY_CARE_PROVIDER_SITE_OTHER): Payer: Self-pay

## 2016-12-19 ENCOUNTER — Telehealth (INDEPENDENT_AMBULATORY_CARE_PROVIDER_SITE_OTHER): Payer: Self-pay | Admitting: Orthopedic Surgery

## 2016-12-19 MED ORDER — COLCHICINE 0.6 MG PO CAPS: 0.6000 mg | ORAL_CAPSULE | Freq: Every day | ORAL | 2 refills | Status: DC | PRN

## 2016-12-19 NOTE — Telephone Encounter (Signed)
I called to advise patient that colchicine tablets denied by insurance. Capsules should be approved. This was sent into pharmacy. Had a discussion about gout, what colchicine is, what foods to avoid.

## 2016-12-19 NOTE — Telephone Encounter (Signed)
PT REQUESTED A CALL BACK REGARDING THE MEDICATION SHE WAS GIVEN.  [phone]

## 2017-01-02 ENCOUNTER — Other Ambulatory Visit (INDEPENDENT_AMBULATORY_CARE_PROVIDER_SITE_OTHER): Payer: Self-pay | Admitting: Orthopedic Surgery

## 2017-01-03 ENCOUNTER — Telehealth (INDEPENDENT_AMBULATORY_CARE_PROVIDER_SITE_OTHER): Payer: Self-pay | Admitting: Orthopedic Surgery

## 2017-01-03 ENCOUNTER — Other Ambulatory Visit (INDEPENDENT_AMBULATORY_CARE_PROVIDER_SITE_OTHER): Payer: Self-pay | Admitting: Orthopedic Surgery

## 2017-01-03 MED ORDER — DICLOFENAC SODIUM 1 % TD GEL
2.0000 g | Freq: Four times a day (QID) | TRANSDERMAL | 2 refills | Status: DC
Start: 2017-01-03 — End: 2017-02-02

## 2017-01-03 NOTE — Telephone Encounter (Signed)
Advised patient over the phone why her prednisone prescription was declined. Prednisone is not meant to be taken for chronic, long term use can cause AVN. She just got an rx for this 12/04/16. Rx sent into pharmacy for volatren gel patient aware. Made follow up appointment with patient over the phone.

## 2017-01-15 ENCOUNTER — Other Ambulatory Visit (INDEPENDENT_AMBULATORY_CARE_PROVIDER_SITE_OTHER): Payer: Self-pay | Admitting: Orthopedic Surgery

## 2017-01-15 ENCOUNTER — Ambulatory Visit (INDEPENDENT_AMBULATORY_CARE_PROVIDER_SITE_OTHER): Payer: Medicaid Other | Admitting: Orthopedic Surgery

## 2017-01-15 ENCOUNTER — Encounter (INDEPENDENT_AMBULATORY_CARE_PROVIDER_SITE_OTHER): Payer: Self-pay | Admitting: Orthopedic Surgery

## 2017-01-15 DIAGNOSIS — M6701 Short Achilles tendon (acquired), right ankle: Secondary | ICD-10-CM | POA: Insufficient documentation

## 2017-01-15 DIAGNOSIS — M79672 Pain in left foot: Secondary | ICD-10-CM | POA: Diagnosis not present

## 2017-01-15 DIAGNOSIS — M6702 Short Achilles tendon (acquired), left ankle: Secondary | ICD-10-CM | POA: Diagnosis not present

## 2017-01-15 DIAGNOSIS — M79671 Pain in right foot: Secondary | ICD-10-CM | POA: Diagnosis not present

## 2017-01-15 MED ORDER — PREDNISONE 10 MG PO TABS: 10.0000 mg | ORAL_TABLET | Freq: Every day | ORAL | 0 refills | Status: DC

## 2017-01-15 NOTE — Progress Notes (Signed)
Office Visit Note   Patient: Lori [name]           Date of Birth: 1955-09-08           MRN: [id number] Visit Date: 01/15/2017              Requested by: Alvester Chou, [facility] [address] [place], [place] [address] PCP: Alvester Chou, NP  Chief Complaint  Patient presents with  . Right Foot - Follow-up, Pain  . Left Foot - Follow-up, Pain      HPI: Patient is a 61 year old woman presents in follow-up for bilateral foot pain. She complains of global burning pain circumferential around both feet. She states that she could not obtain the Voltaren gel states that this was not covered by insurance she has tried Neurontin in the past and this has not helped patient states she does not recall taking any prednisone she states she did take a pill for gout which was green and states that this did not help. Patient denies any fever or chills.  Assessment & Plan: Visit Diagnoses:  1. Bilateral foot pain   2. Achilles tendon contracture, bilateral     Plan: Patient is a 61 year old woman complaining of bilateral foot pain. Patient is very tearful about her foot pain. She is currently wearing sandals. She states that the Voltaren gel was too expensive. She has tried Neurontin in the past and this has not helped. She states that she did take the green gout pills and this did not help her at all. Patient states she has not taken prednisone. Patient states she would like to go to pain management.  Patient's uric acid was 6.7 her sedimentation rate was in the normal range her rheumatoid factor and ANA were negative. White blood cell count is normal.  Uric acid level repeated. We will start prednisone 10 mg every morning with breakfast.  Follow-Up Instructions: Return in about 3 weeks (around 02/05/2017).   Ortho Exam  Patient is alert, oriented, no adenopathy, well-dressed, normal affect, normal respiratory effort. Examination patient has an antalgic gait. She is  extremely heel cord tightness with dorsiflexion about 20 short of neutral bilaterally. She has good pulses bilaterally good ankle and subtalar motion. Patient has no plantar ulcers there are no venous stasis ulcers she has hypersensitivity to light touch in both feet. She has no dystrophic changes no skin color or temperature changes.  Imaging: No results found. No images are attached to the encounter.  Labs: Lab Results  Component Value Date   HGBA1C 5.5 07/02/2015   HGBA1C 5.5 05/10/2011   ESRSEDRATE 20 12/14/2016   LABURIC 6.7 12/14/2016   REPTSTATUS 09/12/2016 FINAL 09/10/2016   CULT MULTIPLE SPECIES PRESENT, SUGGEST RECOLLECTION (A) 09/10/2016    Orders:  No orders of the defined types were placed in this encounter.  No orders of the defined types were placed in this encounter.    Procedures: No procedures performed  Clinical Data: No additional findings.  ROS:  All other systems negative, except as noted in the HPI. Review of Systems  Objective: Vital Signs: There were no vitals taken for this visit.  Specialty Comments:  No specialty comments available.  PMFS History: Patient Active Problem List   Diagnosis Date Noted  . Bilateral foot pain 01/15/2017  . 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 Normocytic anemia 09/12/2016  . Hyponatremia 09/12/2016  . Acute pancreatitis 09/11/2016  . Shin splints, sequela 08/16/2016  . Cellulitis  of groin 01/05/2016  . Encounter for screening mammogram for breast cancer 07/02/2015  . 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 Allergic rhinitis 11/27/2014  . Otitis media of right ear 07/01/2012  . Cough 11/20/2011  . Renal lesion 06/12/2011  . Nausea 05/10/2011  . Caries 04/12/2011  . Abdominal pain 10/25/2010  . Preventive measure 10/25/2010  . Shoulder pain, left 10/25/2010  . Leg pain 06/20/2010  . CERVICAL CANCER 10/23/2008  . TOBACCO ABUSE 10/23/2008  . Depression with anxiety 10/23/2008  .  Essential hypertension 10/23/2008  . GERD 10/23/2008  . Insomnia 10/23/2008  . SNORING 10/23/2008   Past Medical History:  Diagnosis Date  . Arthritis   . Cancer ([place])   . Depression   . Depression with anxiety 08-25-2008   Patient had multiple death in her family over a short period of time. father, brother, uncle and niece who was stabbed 3 years ago. Patient's husband had a car accident last year and  need currently a feeding tube.   Marland Kitchen GERD (gastroesophageal reflux disease)   . Headache(784.0)   . History of cervical cancer  1985    status post partial hysterectomy , last Pap smear 10 years ago , no further followup  . History of cocaine abuse 10/23/2008    last documented in 08/25/2005 when admitted for hypertensive urgency  . Hypertension 2005-08-25   Admitted for HTN crisis in 10/2008. Was given in 08-25-2008 a wrong prescription from the pharmacy and per ED note it was Lisinopril-Hydrocholorthizide which gave her a  hives and feeling of sickness. Patient was started  on this meds on 05/31/2010 by Dr  Ihor Gully without any problem.   . Insomnia   . Tobacco abuse     35 years    Family History  Problem Relation Age of Onset  . Thyroid disease Mother   . Diabetes Father   . Heart disease Father   . Hyperlipidemia Father   . Hypertension Father   . Diabetes Brother   . Hypertension Brother   . Hyperlipidemia Brother   . Colon polyps Brother   . Thyroid disease Daughter   . Obesity Daughter   . Mental illness Daughter   . Colon cancer Maternal Uncle     Past Surgical History:  Procedure Laterality Date  . ABDOMINAL HYSTERECTOMY    . CESAREAN SECTION  1985  . SKIN GRAFT     Social History   Occupational History  . Not on file.   Social History Main Topics  . Smoking status: Current Some Day Smoker    Packs/day: 0.50    Years: 43.00    Types: Cigarettes  . Smokeless tobacco: Never Used     Comment: patches have helped in the past  . Alcohol use No  . Drug use: No  . Sexual  activity: Not on file

## 2017-01-16 LAB — URIC ACID: Uric Acid, Serum: 7 mg/dL (ref 2.5–7.0)

## 2017-01-17 ENCOUNTER — Telehealth (INDEPENDENT_AMBULATORY_CARE_PROVIDER_SITE_OTHER): Payer: Self-pay | Admitting: Radiology

## 2017-01-17 NOTE — Telephone Encounter (Signed)
Advised patient her uric acid was 7.0 continue with gout medication. Continue with prednisone 10mg  with breakfast daily. And we will see her in three weeks.

## 2017-01-17 NOTE — Telephone Encounter (Signed)
-----   Message from Newt Minion, MD sent at 01/16/2017 11:18 AM EDT ----- Call patient her uric acid is about the same at 7.0 ----- Message ----- From: Interface, [facility] Lab Results In Sent: 01/16/2017   2:57 AM To: Newt Minion, MD

## 2017-02-02 ENCOUNTER — Ambulatory Visit (INDEPENDENT_AMBULATORY_CARE_PROVIDER_SITE_OTHER): Payer: Medicaid Other | Admitting: Gastroenterology

## 2017-02-02 ENCOUNTER — Other Ambulatory Visit (INDEPENDENT_AMBULATORY_CARE_PROVIDER_SITE_OTHER): Payer: Medicaid Other

## 2017-02-02 ENCOUNTER — Encounter: Payer: Self-pay | Admitting: Gastroenterology

## 2017-02-02 VITALS — BP 114/64 | HR 80 | Ht 62.0 in | Wt 186.1 lb

## 2017-02-02 DIAGNOSIS — K859 Acute pancreatitis without necrosis or infection, unspecified: Secondary | ICD-10-CM

## 2017-02-02 DIAGNOSIS — R109 Unspecified abdominal pain: Secondary | ICD-10-CM

## 2017-02-02 DIAGNOSIS — Z86010 Personal history of colonic polyps: Secondary | ICD-10-CM | POA: Diagnosis not present

## 2017-02-02 DIAGNOSIS — Z860101 Personal history of adenomatous and serrated colon polyps: Secondary | ICD-10-CM

## 2017-02-02 DIAGNOSIS — R140 Abdominal distension (gaseous): Secondary | ICD-10-CM | POA: Diagnosis not present

## 2017-02-02 LAB — CBC WITH DIFFERENTIAL/PLATELET
Basophils Absolute: 0.1 10*3/uL (ref 0.0–0.1)
Basophils Relative: 1.2 % (ref 0.0–3.0)
Eosinophils Absolute: 0.3 10*3/uL (ref 0.0–0.7)
Eosinophils Relative: 3.3 % (ref 0.0–5.0)
HCT: 36 % (ref 36.0–46.0)
Hemoglobin: 12.2 g/dL (ref 12.0–15.0)
Lymphocytes Relative: 31.4 % (ref 12.0–46.0)
Lymphs Abs: 2.7 10*3/uL (ref 0.7–4.0)
MCHC: 33.8 g/dL (ref 30.0–36.0)
MCV: 93.7 fl (ref 78.0–100.0)
Monocytes Absolute: 0.6 10*3/uL (ref 0.1–1.0)
Monocytes Relative: 7.3 % (ref 3.0–12.0)
Neutro Abs: 4.8 10*3/uL (ref 1.4–7.7)
Neutrophils Relative %: 56.8 % (ref 43.0–77.0)
Platelets: 266 10*3/uL (ref 150.0–400.0)
RBC: 3.84 Mil/uL — ABNORMAL LOW (ref 3.87–5.11)
RDW: 13.2 % (ref 11.5–15.5)
WBC: 8.4 10*3/uL (ref 4.0–10.5)

## 2017-02-02 LAB — COMPREHENSIVE METABOLIC PANEL
ALT: 9 U/L (ref 0–35)
AST: 15 U/L (ref 0–37)
Albumin: 4.3 g/dL (ref 3.5–5.2)
Alkaline Phosphatase: 44 U/L (ref 39–117)
BUN: 12 mg/dL (ref 6–23)
CO2: 29 mEq/L (ref 19–32)
Calcium: 9.7 mg/dL (ref 8.4–10.5)
Chloride: 99 mEq/L (ref 96–112)
Creatinine, Ser: 0.99 mg/dL (ref 0.40–1.20)
GFR: 60.73 mL/min (ref >60.00)
Glucose, Bld: 105 mg/dL — ABNORMAL HIGH (ref 70–99)
Potassium: 3.9 mEq/L (ref 3.5–5.1)
Sodium: 135 mEq/L (ref 135–145)
Total Bilirubin: 0.6 mg/dL (ref 0.2–1.2)
Total Protein: 7.2 g/dL (ref 6.0–8.3)

## 2017-02-02 MED ORDER — NA SULFATE-K SULFATE-MG SULF 17.5-3.13-1.6 GM/177ML PO SOLN: 1.0000 | Freq: Once | ORAL | 0 refills | Status: AC

## 2017-02-02 NOTE — Patient Instructions (Addendum)
You will be set up for an ultrasound for recent pancreatitis, check for gallstones in GB. If stones are found in your gallbladder you will be referred to a surgeon to consider cholecystectomy (gallbladder removal).  You have been scheduled for an abdominal ultrasound at [facility] (1st floor of hospital) on 02/07/17 at 9 am. Please arrive 15 minutes prior to your appointment for registration. Make certain not to have anything to eat or drink 6 hours prior to your appointment. Should you need to reschedule your appointment, please contact radiology at [phone]. This test typically takes about 30 minutes to perform.  You will be set up for a colonoscopy for history of adenomatous polyps ([facility], October 11th).  You will be set up for an upper endoscopy for bloating, abdominal pains ([facility]).  Stop taking epsom salt and instead please start taking citrucel (orange flavored) powder fiber supplement. Take miralax one dose once daily.  You will have labs checked today in the basement lab.  Please head down after you check out with the front desk  (cbc, cmet)  Stop advil.  Tylenol is safe at doses not to exceed the box direction.  Normal BMI (Body Mass Index- based on height and weight) is between 19 and 25. Your BMI today is Body mass index is 34.04 kg/m. Marland Kitchen Please consider follow up  regarding your BMI with your Primary Care Provider.

## 2017-02-02 NOTE — Progress Notes (Signed)
Review of pertinent gastrointestinal problems: 1. Adenomatous colon polyp: Colonoscopy 08/2016 Dr. Deatra Ina for routine screening found a single subCM adenoma, recommended 5 year recall.   HPI: This is a  very pleasant 61 year old woman  who was referred to me by Alvester Chou, NP  to evaluate  abdominal pain, constipation, recent pancreatitis .    Chief complaint is abdominal pain, bloating, constipation, recent pancreatitis  She was hospitalized for a 2 day stay this past May for mild acute pancreatitis.  The workup is summarized below. She was advised to not resume hydrochlorothiazide since it has been known, albeit rarely, to cause acute pancreatitis.  CT scan abd/pelvis with IV an oral contrast 09/2016: "acute pancreatitis without necrosis or complication" Labs 08/6566: lipase max 318, LFTs were all normal, triglycerides were normal.  Abdominal US was not performed.  Abdominal pains about a year ago, admitted 8 days with celluliitis, in [place].  She noticed tighter stomach over the past 3 weeks.  Has been nauseas also.  She takes Protonix once daily. She has been on this for a year or 2.  Has been constipated, taking epson salts. She has never taken MiraLAX or fiber supplements..  She's gained 25 pounds in about 41months.  She is on prednisone for gout, for the past 2 weeks.  She takes advil 6 per day.  Takes goodies 2-3 per month.  Also takes about 3grams of tylenol per day.  She takes these for gouty pains.  PCP Dr. Aris Lot.   Review of systems: Pertinent positive and negative review of systems were noted in the above HPI section. All other review negative.   Past Medical History:  Diagnosis Date  . Arthritis   . Depression   . Depression with anxiety August 19, 2008   Patient had multiple death in her family over a short period of time. father, brother, uncle and niece who was stabbed 3 years ago. Patient's husband had a car accident last year and  need currently a feeding tube.   Marland Kitchen GERD  (gastroesophageal reflux disease)   . Gout   . Headache(784.0)   . History of cervical cancer  1985    status post partial hysterectomy , last Pap smear 10 years ago , no further followup  . History of cocaine abuse 10/23/2008    last documented in 2005-08-19 when admitted for hypertensive urgency  . Hypertension 08-19-2005   Admitted for HTN crisis in 10/2008. Was given in 08-19-08 a wrong prescription from the pharmacy and per ED note it was Lisinopril-Hydrocholorthizide which gave her a  hives and feeling of sickness. Patient was started  on this meds on 05/31/2010 by Dr  Ihor Gully without any problem.   . Insomnia   . Tobacco abuse     35 years  . Uterine cancer [facility])     Past Surgical History:  Procedure Laterality Date  . CESAREAN SECTION  1985  . PARTIAL HYSTERECTOMY    . SKIN GRAFT      Current Outpatient Prescriptions  Medication Sig Dispense Refill  . Colchicine 0.6 MG CAPS Take 0.6 mg by mouth daily as needed. 30 capsule 2  . FLUoxetine (PROZAC) 40 MG capsule TAKE 1 CAPSULE (40 MG TOTAL) BY MOUTH DAILY. 30 capsule 1  . gabapentin (NEURONTIN) 300 MG capsule TAKE 1 CAPSULE (300 MG TOTAL) BY MOUTH AT BEDTIME. 30 capsule 2  . lisinopril (PRINIVIL,ZESTRIL) 20 MG tablet TAKE 1 TABLET (20 MG TOTAL) BY MOUTH DAILY. 30 tablet 0  . NICOTINE STEP 3 7  MG/24HR patch Place 1 patch onto the skin every other day.  1  . ondansetron (ZOFRAN) 4 MG tablet Take 1 tablet (4 mg total) by mouth every 6 (six) hours as needed for nausea. 20 tablet 0  . pantoprazole (PROTONIX) 40 MG tablet Take 40 mg by mouth daily.  5  . predniSONE (DELTASONE) 10 MG tablet Take 1 tablet (10 mg total) by mouth daily with breakfast. 30 tablet 0  . QUEtiapine (SEROQUEL) 25 MG tablet Take 1 tablet by mouth at bedtime.  1  . sucralfate (CARAFATE) 1 g tablet Take 1 g by mouth 4 (four) times daily -  with meals and at bedtime.    . traZODone (DESYREL) 100 MG tablet Take 1 tablet by mouth at bedtime.  5  . zolpidem (AMBIEN) 5 MG tablet  Take 5 mg by mouth at bedtime as needed. for sleep  5   No current facility-administered medications for this visit.     Allergies as of 02/02/2017  . (No Known Allergies)    Family History  Problem Relation Age of Onset  . Thyroid disease Mother   . Diabetes Father   . Heart disease Father   . Hyperlipidemia Father   . Hypertension Father   . Diabetes Brother   . Hypertension Brother   . Hyperlipidemia Brother   . Colon polyps Brother   . Thyroid disease Daughter   . Obesity Daughter   . Mental illness Daughter   . Colon cancer Maternal Uncle     Social History   Social History  . Marital status: Single    Spouse name: N/A  . Number of children: 2  . Years of education: N/A   Occupational History  . Not on file.   Social History Main Topics  . Smoking status: Former Smoker    Packs/day: 0.50    Years: 43.00    Types: Cigarettes    Quit date: 09/05/2016  . Smokeless tobacco: Never Used     Comment: patches have helped in the past  . Alcohol use No  . Drug use: No  . Sexual activity: Not on file   Other Topics Concern  . Not on file   Social History Narrative   Patient had multiple death in her family over a short period of time. father, brother, uncle and niece who was stabbed 3 years ago. Patient's husband had a car accident last year and  need currently a feeding tube.            Physical Exam: BP 114/64 (BP Location: Left Arm, Patient Position: Sitting, Cuff Size: Normal)   Pulse 80   Ht 5\' 2"  (1.575 m) Comment: height measured without shoes  Wt 186 lb 2 oz (84.4 kg)   BMI 34.04 kg/m  Constitutional: generally well-appearing Psychiatric: alert and oriented x3 Eyes: extraocular movements intact Mouth: oral pharynx moist, no lesions Neck: supple no lymphadenopathy Cardiovascular: heart regular rate and rhythm Lungs: clear to auscultation bilaterally Abdomen: soft, Mildly tender throughout the epigastrium,  nondistended, no obvious ascites, no  peritoneal signs, normal bowel sounds Extremities: no lower extremity edema bilaterally Skin: no lesions on visible extremities   Assessment and plan: 61 y.o. female with  abdominal pain, constipation, personal history of adenomatous polyps, recent mild acute pancreatitis  First she is taking 6 Advil every day for gouty foot pain. I explained to her that this is a very high dose and can cause ulcers which might be the cause of her abdominal pains.  I recommended she completely stopped the Advil. She will continue to take proton X once daily and I recommended upper endoscopy in the next week or 2. She has significant constipation and has been taking Epsom salts because of it. I recommended she stop the Epsom salts and instead take MiraLAX on a daily basis. She has a personal history of adenomatous polyps and at the same time as her upper endoscopy we will proceed with colonoscopy to evaluate her constipation as well as surveillance her for more polyps. She had mild acute pancreatitis 4 months ago. She is not alcohol drinker. CAT scan did not show any signs of gallstone disease however an ultrasound was never done which is a much more sensitive test than CAT scan for gallbladder stones. We will arrange for abdominal ultrasound to be performed. She will lastly get a basic set of labs including a CBC and complete metabolic profile now.      Please see the "Patient Instructions" section for addition details about the plan.   Owens Loffler, MD [facility] 02/02/2017, 3:03 PM  Cc: Alvester Chou, NP

## 2017-02-05 ENCOUNTER — Telehealth: Payer: Self-pay | Admitting: Gastroenterology

## 2017-02-05 NOTE — Telephone Encounter (Signed)
The pt has been rescheduled from 10/11 to 10/25 at 730 am.  She was re instructed and verbalized understanding.  Endo schedule was notified

## 2017-02-07 ENCOUNTER — Ambulatory Visit (HOSPITAL_COMMUNITY)
Admission: RE | Admit: 2017-02-07 | Discharge: 2017-02-07 | Disposition: A | Discharge status: Home / Self Care | Payer: Medicaid Other | Source: Ambulatory Visit | Attending: Gastroenterology | Admitting: Gastroenterology

## 2017-02-07 DIAGNOSIS — Z86010 Personal history of colonic polyps: Secondary | ICD-10-CM | POA: Diagnosis present

## 2017-02-07 DIAGNOSIS — R140 Abdominal distension (gaseous): Secondary | ICD-10-CM | POA: Diagnosis present

## 2017-02-07 DIAGNOSIS — K859 Acute pancreatitis without necrosis or infection, unspecified: Secondary | ICD-10-CM | POA: Insufficient documentation

## 2017-02-07 DIAGNOSIS — R109 Unspecified abdominal pain: Secondary | ICD-10-CM | POA: Diagnosis not present

## 2017-02-08 ENCOUNTER — Telehealth: Payer: Self-pay | Admitting: Gastroenterology

## 2017-02-08 NOTE — Telephone Encounter (Signed)
Milus Banister, MD sent to Jeoffrey Massed, RN          Please call the patient. The labs were all normal. No gallstone in GB. Should continue with the suggestions outlined at recent visit.    The pt has been notified and will call back with any concerns

## 2017-02-19 ENCOUNTER — Inpatient Hospital Stay
Admission: RE | Admit: 2017-02-19 | Discharge: 2017-02-19 | Disposition: A | Discharge status: Home / Self Care | Payer: Medicaid Other | Source: Ambulatory Visit | Attending: Adult Health | Admitting: Adult Health

## 2017-02-26 ENCOUNTER — Encounter (HOSPITAL_COMMUNITY): Payer: Self-pay

## 2017-03-01 ENCOUNTER — Ambulatory Visit (HOSPITAL_COMMUNITY)
Admission: RE | Admit: 2017-03-01 | Discharge: 2017-03-01 | Disposition: A | Discharge status: Home / Self Care | Payer: Medicaid Other | Source: Ambulatory Visit | Attending: Gastroenterology | Admitting: Gastroenterology

## 2017-03-01 ENCOUNTER — Ambulatory Visit (HOSPITAL_COMMUNITY): Payer: Medicaid Other | Admitting: Anesthesiology

## 2017-03-01 ENCOUNTER — Encounter (HOSPITAL_COMMUNITY): Payer: Self-pay | Admitting: *Deleted

## 2017-03-01 ENCOUNTER — Encounter (HOSPITAL_COMMUNITY)
Admission: RE | Disposition: A | Discharge status: Home / Self Care | Payer: Self-pay | Source: Ambulatory Visit | Attending: Gastroenterology

## 2017-03-01 DIAGNOSIS — Z8542 Personal history of malignant neoplasm of other parts of uterus: Secondary | ICD-10-CM | POA: Diagnosis not present

## 2017-03-01 DIAGNOSIS — K2970 Gastritis, unspecified, without bleeding: Secondary | ICD-10-CM

## 2017-03-01 DIAGNOSIS — Z79899 Other long term (current) drug therapy: Secondary | ICD-10-CM | POA: Insufficient documentation

## 2017-03-01 DIAGNOSIS — B9681 Helicobacter pylori [H. pylori] as the cause of diseases classified elsewhere: Secondary | ICD-10-CM | POA: Diagnosis not present

## 2017-03-01 DIAGNOSIS — M109 Gout, unspecified: Secondary | ICD-10-CM | POA: Diagnosis not present

## 2017-03-01 DIAGNOSIS — Z8541 Personal history of malignant neoplasm of cervix uteri: Secondary | ICD-10-CM | POA: Insufficient documentation

## 2017-03-01 DIAGNOSIS — K219 Gastro-esophageal reflux disease without esophagitis: Secondary | ICD-10-CM | POA: Diagnosis not present

## 2017-03-01 DIAGNOSIS — Z860101 Personal history of adenomatous and serrated colon polyps: Secondary | ICD-10-CM

## 2017-03-01 DIAGNOSIS — K2990 Gastroduodenitis, unspecified, without bleeding: Secondary | ICD-10-CM

## 2017-03-01 DIAGNOSIS — Z86010 Personal history of colonic polyps: Secondary | ICD-10-CM | POA: Insufficient documentation

## 2017-03-01 DIAGNOSIS — R140 Abdominal distension (gaseous): Secondary | ICD-10-CM

## 2017-03-01 DIAGNOSIS — F329 Major depressive disorder, single episode, unspecified: Secondary | ICD-10-CM | POA: Diagnosis not present

## 2017-03-01 DIAGNOSIS — R109 Unspecified abdominal pain: Secondary | ICD-10-CM

## 2017-03-01 DIAGNOSIS — F419 Anxiety disorder, unspecified: Secondary | ICD-10-CM | POA: Insufficient documentation

## 2017-03-01 DIAGNOSIS — D123 Benign neoplasm of transverse colon: Secondary | ICD-10-CM | POA: Insufficient documentation

## 2017-03-01 DIAGNOSIS — Z1211 Encounter for screening for malignant neoplasm of colon: Secondary | ICD-10-CM | POA: Insufficient documentation

## 2017-03-01 DIAGNOSIS — K5900 Constipation, unspecified: Secondary | ICD-10-CM | POA: Insufficient documentation

## 2017-03-01 DIAGNOSIS — Z87891 Personal history of nicotine dependence: Secondary | ICD-10-CM | POA: Insufficient documentation

## 2017-03-01 DIAGNOSIS — K859 Acute pancreatitis without necrosis or infection, unspecified: Secondary | ICD-10-CM

## 2017-03-01 DIAGNOSIS — Z7952 Long term (current) use of systemic steroids: Secondary | ICD-10-CM | POA: Insufficient documentation

## 2017-03-01 DIAGNOSIS — I10 Essential (primary) hypertension: Secondary | ICD-10-CM | POA: Insufficient documentation

## 2017-03-01 HISTORY — PX: COLONOSCOPY WITH PROPOFOL: SHX5780

## 2017-03-01 HISTORY — PX: ESOPHAGOGASTRODUODENOSCOPY (EGD) WITH PROPOFOL: SHX5813

## 2017-03-01 SURGERY — COLONOSCOPY WITH PROPOFOL
Anesthesia: Monitor Anesthesia Care

## 2017-03-01 MED ORDER — ONDANSETRON HCL 4 MG/2ML IJ SOLN
INTRAMUSCULAR | Status: DC | PRN
  Administered 2017-03-01: 4 mg via INTRAVENOUS

## 2017-03-01 MED ORDER — ONDANSETRON HCL 4 MG/2ML IJ SOLN
INTRAMUSCULAR | Status: AC
  Filled 2017-03-01: qty 2

## 2017-03-01 MED ORDER — LACTATED RINGERS IV SOLN
INTRAVENOUS | Status: DC
  Administered 2017-03-01: 07:00:00 via INTRAVENOUS

## 2017-03-01 MED ORDER — SODIUM CHLORIDE 0.9 % IV SOLN: INTRAVENOUS | Status: DC

## 2017-03-01 MED ORDER — PROPOFOL 500 MG/50ML IV EMUL
INTRAVENOUS | Status: DC | PRN
  Administered 2017-03-01: 125 ug/kg/min via INTRAVENOUS

## 2017-03-01 MED ORDER — PROPOFOL 500 MG/50ML IV EMUL
INTRAVENOUS | Status: DC | PRN
  Administered 2017-03-01 (×2): 20 mg via INTRAVENOUS
  Administered 2017-03-01: 40 mg via INTRAVENOUS
  Administered 2017-03-01: 20 mg via INTRAVENOUS

## 2017-03-01 MED ORDER — PROPOFOL 10 MG/ML IV BOLUS
INTRAVENOUS | Status: AC
  Filled 2017-03-01: qty 40

## 2017-03-01 MED ORDER — PROPOFOL 10 MG/ML IV BOLUS
INTRAVENOUS | Status: AC
  Filled 2017-03-01: qty 20

## 2017-03-01 SURGICAL SUPPLY — 25 items

## 2017-03-01 NOTE — Anesthesia Preprocedure Evaluation (Addendum)
Anesthesia Evaluation  Patient identified by MRN, date of birth, ID band Patient awake    Reviewed: Allergy & Precautions, NPO status , Patient's Chart, lab work & pertinent test results  Airway Mallampati: II  TM Distance: >3 FB Neck ROM: Full    Dental  (+) Edentulous Upper, Missing, Poor Dentition   Pulmonary neg pulmonary ROS, former smoker,    Pulmonary exam normal breath sounds clear to auscultation       Cardiovascular hypertension, negative cardio ROS Normal cardiovascular exam Rhythm:Regular Rate:Normal     Neuro/Psych  Headaches, PSYCHIATRIC DISORDERS Anxiety Depression    GI/Hepatic Neg liver ROS, GERD  ,  Endo/Other  negative endocrine ROS  Renal/GU negative Renal ROS     Musculoskeletal  (+) Arthritis ,   Abdominal   Peds  Hematology  (+) Blood dyscrasia, anemia ,   Anesthesia Other Findings   Reproductive/Obstetrics negative OB ROS                            Anesthesia Physical Anesthesia Plan  ASA: II  Anesthesia Plan: MAC   Post-op Pain Management:    Induction: Intravenous  PONV Risk Score and Plan: 2 and Ondansetron and Propofol infusion  Airway Management Planned:   Additional Equipment:   Intra-op Plan:   Post-operative Plan:   Informed Consent: I have reviewed the patients History and Physical, chart, labs and discussed the procedure including the risks, benefits and alternatives for the proposed anesthesia with the patient or authorized representative who has indicated his/her understanding and acceptance.   Dental advisory given  Plan Discussed with: CRNA  Anesthesia Plan Comments:         Anesthesia Quick Evaluation

## 2017-03-01 NOTE — Anesthesia Procedure Notes (Signed)
Date/Time: 03/01/2017 7:31 AM Performed by: Glory Buff Oxygen Delivery Method: Nasal cannula

## 2017-03-01 NOTE — Interval H&P Note (Signed)
History and Physical Interval Note:  03/01/2017 7:20 AM  Lori [name]  has presented today for surgery, with the diagnosis of bloating, abd pain, hx polyps   The various methods of treatment have been discussed with the patient and family. After consideration of risks, benefits and other options for treatment, the patient has consented to  Procedure(s): COLONOSCOPY WITH PROPOFOL (N/A) ESOPHAGOGASTRODUODENOSCOPY (EGD) WITH PROPOFOL (N/A) as a surgical intervention .  The patient's history has been reviewed, patient examined, no change in status, stable for surgery.  I have reviewed the patient's chart and labs.  Questions were answered to the patient's satisfaction.     Milus Banister

## 2017-03-01 NOTE — Op Note (Signed)
[facility] Patient Name: Lori [name] Procedure Date: 03/01/2017 MRN: [id number] Attending MD: Milus Banister , MD Date of Birth: 14-Sep-1955 CSN: [id number] Age: 61 Admit Type: Outpatient Procedure:                Upper GI endoscopy Indications:              Epigastric abdominal pain Providers:                Milus Banister, MD, Angus Seller, William Dalton, Technician Referring MD:              Medicines:                Monitored Anesthesia Care Complications:            No immediate complications. Estimated blood loss:                            None. Estimated Blood Loss:     Estimated blood loss: none. Procedure:                Pre-Anesthesia Assessment:                           - Prior to the procedure, a History and Physical                            was performed, and patient medications and                            allergies were reviewed. The patient's tolerance of                            previous anesthesia was also reviewed. The risks                            and benefits of the procedure and the sedation                            options and risks were discussed with the patient.                            All questions were answered, and informed consent                            was obtained. Prior Anticoagulants: The patient has                            taken no previous anticoagulant or antiplatelet                            agents. ASA Grade Assessment: III - A patient with                            severe systemic  disease. After reviewing the risks                            and benefits, the patient was deemed in                            satisfactory condition to undergo the procedure.                           - Prior to the procedure, a History and Physical                            was performed, and patient medications and                            allergies were reviewed. The patient's  tolerance of                            previous anesthesia was also reviewed. The risks                            and benefits of the procedure and the sedation                            options and risks were discussed with the patient.                            All questions were answered, and informed consent                            was obtained. Prior Anticoagulants: The patient has                            taken no previous anticoagulant or antiplatelet                            agents. ASA Grade Assessment: III - A patient with                            severe systemic disease. After reviewing the risks                            and benefits, the patient was deemed in                            satisfactory condition to undergo the procedure.                           After obtaining informed consent, the endoscope was                            passed under direct vision. Throughout the  procedure, the patient's blood pressure, pulse, and                            oxygen saturations were monitored continuously. The                            [id number] ([id number]) scope was introduced through the                            mouth, and advanced to the second part of duodenum.                            The upper GI endoscopy was accomplished without                            difficulty. The patient tolerated the procedure                            well. Scope In: Scope Out: Findings:      The esophagus was normal.      Minimal inflammation characterized by erythema was found in the gastric       antrum. Biopsies were taken with a cold forceps for histology.      The examined duodenum was normal. Impression:               - Normal esophagus.                           - Mild gatritis, biopsied to check for H. pylori.                           - Normal examined duodenum. Moderate Sedation:      N/A- Per Anesthesia Care Recommendation:           -  Patient has a contact number available for                            emergencies. The signs and symptoms of potential                            delayed complications were discussed with the                            patient. Return to normal activities tomorrow.                            Written discharge instructions were provided to the                            patient.                           - Resume previous diet.                           - Continue present medications. Continue once daily  protonix and continue to avoid NSAIDs.                           - Await pathology results. If biopsies show H.                            pylori you will be started on appropriate                            antibiotics. Procedure Code(s):        --- Professional ---                           [phone], Esophagogastroduodenoscopy, flexible,                            transoral; with biopsy, single or multiple Diagnosis Code(s):        --- Professional ---                           K29.70, Gastritis, unspecified, without bleeding                           R10.13, Epigastric pain CPT copyright 2016 American Medical Association. All rights reserved. The codes documented in this report are preliminary and upon coder review may  be revised to meet current compliance requirements. Milus Banister, MD 03/01/2017 8:03:31 AM This report has been signed electronically. Number of Addenda: 0

## 2017-03-01 NOTE — H&P (View-Only) (Signed)
Review of pertinent gastrointestinal problems: 1. Adenomatous colon polyp: Colonoscopy 08/2016 Dr. Deatra Ina for routine screening found a single subCM adenoma, recommended 5 year recall.   HPI: This is a  very pleasant 61 year old woman  who was referred to me by Alvester Chou, NP  to evaluate  abdominal pain, constipation, recent pancreatitis .    Chief complaint is abdominal pain, bloating, constipation, recent pancreatitis  She was hospitalized for a 2 day stay this past May for mild acute pancreatitis.  The workup is summarized below. She was advised to not resume hydrochlorothiazide since it has been known, albeit rarely, to cause acute pancreatitis.  CT scan abd/pelvis with IV an oral contrast 09/2016: "acute pancreatitis without necrosis or complication" Labs 01/5637: lipase max 318, LFTs were all normal, triglycerides were normal.  Abdominal US was not performed.  Abdominal pains about a year ago, admitted 8 days with celluliitis, in [place].  She noticed tighter stomach over the past 3 weeks.  Has been nauseas also.  She takes Protonix once daily. She has been on this for a year or 2.  Has been constipated, taking epson salts. She has never taken MiraLAX or fiber supplements..  She's gained 25 pounds in about 67months.  She is on prednisone for gout, for the past 2 weeks.  She takes advil 6 per day.  Takes goodies 2-3 per month.  Also takes about 3grams of tylenol per day.  She takes these for gouty pains.  PCP Dr. Aris Lot.   Review of systems: Pertinent positive and negative review of systems were noted in the above HPI section. All other review negative.   Past Medical History:  Diagnosis Date  . Arthritis   . Depression   . Depression with anxiety 02-Sep-2008   Patient had multiple death in her family over a short period of time. father, brother, uncle and niece who was stabbed 3 years ago. Patient's husband had a car accident last year and  need currently a feeding tube.   Marland Kitchen GERD  (gastroesophageal reflux disease)   . Gout   . Headache(784.0)   . History of cervical cancer  1985    status post partial hysterectomy , last Pap smear 10 years ago , no further followup  . History of cocaine abuse 10/23/2008    last documented in 09-02-2005 when admitted for hypertensive urgency  . Hypertension 09/02/2005   Admitted for HTN crisis in 10/2008. Was given in 2008/09/02 a wrong prescription from the pharmacy and per ED note it was Lisinopril-Hydrocholorthizide which gave her a  hives and feeling of sickness. Patient was started  on this meds on 05/31/2010 by Dr  Ihor Gully without any problem.   . Insomnia   . Tobacco abuse     35 years  . Uterine cancer [facility])     Past Surgical History:  Procedure Laterality Date  . CESAREAN SECTION  1985  . PARTIAL HYSTERECTOMY    . SKIN GRAFT      Current Outpatient Prescriptions  Medication Sig Dispense Refill  . Colchicine 0.6 MG CAPS Take 0.6 mg by mouth daily as needed. 30 capsule 2  . FLUoxetine (PROZAC) 40 MG capsule TAKE 1 CAPSULE (40 MG TOTAL) BY MOUTH DAILY. 30 capsule 1  . gabapentin (NEURONTIN) 300 MG capsule TAKE 1 CAPSULE (300 MG TOTAL) BY MOUTH AT BEDTIME. 30 capsule 2  . lisinopril (PRINIVIL,ZESTRIL) 20 MG tablet TAKE 1 TABLET (20 MG TOTAL) BY MOUTH DAILY. 30 tablet 0  . NICOTINE STEP 3 7  MG/24HR patch Place 1 patch onto the skin every other day.  1  . ondansetron (ZOFRAN) 4 MG tablet Take 1 tablet (4 mg total) by mouth every 6 (six) hours as needed for nausea. 20 tablet 0  . pantoprazole (PROTONIX) 40 MG tablet Take 40 mg by mouth daily.  5  . predniSONE (DELTASONE) 10 MG tablet Take 1 tablet (10 mg total) by mouth daily with breakfast. 30 tablet 0  . QUEtiapine (SEROQUEL) 25 MG tablet Take 1 tablet by mouth at bedtime.  1  . sucralfate (CARAFATE) 1 g tablet Take 1 g by mouth 4 (four) times daily -  with meals and at bedtime.    . traZODone (DESYREL) 100 MG tablet Take 1 tablet by mouth at bedtime.  5  . zolpidem (AMBIEN) 5 MG tablet  Take 5 mg by mouth at bedtime as needed. for sleep  5   No current facility-administered medications for this visit.     Allergies as of 02/02/2017  . (No Known Allergies)    Family History  Problem Relation Age of Onset  . Thyroid disease Mother   . Diabetes Father   . Heart disease Father   . Hyperlipidemia Father   . Hypertension Father   . Diabetes Brother   . Hypertension Brother   . Hyperlipidemia Brother   . Colon polyps Brother   . Thyroid disease Daughter   . Obesity Daughter   . Mental illness Daughter   . Colon cancer Maternal Uncle     Social History   Social History  . Marital status: Single    Spouse name: N/A  . Number of children: 2  . Years of education: N/A   Occupational History  . Not on file.   Social History Main Topics  . Smoking status: Former Smoker    Packs/day: 0.50    Years: 43.00    Types: Cigarettes    Quit date: 09/05/2016  . Smokeless tobacco: Never Used     Comment: patches have helped in the past  . Alcohol use No  . Drug use: No  . Sexual activity: Not on file   Other Topics Concern  . Not on file   Social History Narrative   Patient had multiple death in her family over a short period of time. father, brother, uncle and niece who was stabbed 3 years ago. Patient's husband had a car accident last year and  need currently a feeding tube.            Physical Exam: BP 114/64 (BP Location: Left Arm, Patient Position: Sitting, Cuff Size: Normal)   Pulse 80   Ht 5\' 2"  (1.575 m) Comment: height measured without shoes  Wt 186 lb 2 oz (84.4 kg)   BMI 34.04 kg/m  Constitutional: generally well-appearing Psychiatric: alert and oriented x3 Eyes: extraocular movements intact Mouth: oral pharynx moist, no lesions Neck: supple no lymphadenopathy Cardiovascular: heart regular rate and rhythm Lungs: clear to auscultation bilaterally Abdomen: soft, Mildly tender throughout the epigastrium,  nondistended, no obvious ascites, no  peritoneal signs, normal bowel sounds Extremities: no lower extremity edema bilaterally Skin: no lesions on visible extremities   Assessment and plan: 61 y.o. female with  abdominal pain, constipation, personal history of adenomatous polyps, recent mild acute pancreatitis  First she is taking 6 Advil every day for gouty foot pain. I explained to her that this is a very high dose and can cause ulcers which might be the cause of her abdominal pains.  I recommended she completely stopped the Advil. She will continue to take proton X once daily and I recommended upper endoscopy in the next week or 2. She has significant constipation and has been taking Epsom salts because of it. I recommended she stop the Epsom salts and instead take MiraLAX on a daily basis. She has a personal history of adenomatous polyps and at the same time as her upper endoscopy we will proceed with colonoscopy to evaluate her constipation as well as surveillance her for more polyps. She had mild acute pancreatitis 4 months ago. She is not alcohol drinker. CAT scan did not show any signs of gallstone disease however an ultrasound was never done which is a much more sensitive test than CAT scan for gallbladder stones. We will arrange for abdominal ultrasound to be performed. She will lastly get a basic set of labs including a CBC and complete metabolic profile now.      Please see the "Patient Instructions" section for addition details about the plan.   Owens Loffler, MD [facility] 02/02/2017, 3:03 PM  Cc: Alvester Chou, NP

## 2017-03-01 NOTE — Transfer of Care (Signed)
Immediate Anesthesia Transfer of Care Note  Patient: Lori [name]  Procedure(s) Performed: COLONOSCOPY WITH PROPOFOL (N/A ) ESOPHAGOGASTRODUODENOSCOPY (EGD) WITH PROPOFOL (N/A )  Patient Location: PACU  Anesthesia Type:MAC  Level of Consciousness: awake  Airway & Oxygen Therapy: Patient Spontanous Breathing and Patient connected to nasal cannula oxygen  Post-op Assessment: Report given to RN and Post -op Vital signs reviewed and stable  Post vital signs: Reviewed and stable  Last Vitals:  Vitals:   03/01/17 0635  BP: (!) 143/79  Pulse: 78  Resp: 19  Temp: 36.6 C  SpO2: 100%    Last Pain:  Vitals:   03/01/17 0635  TempSrc: Oral         Complications: No apparent anesthesia complications

## 2017-03-01 NOTE — Op Note (Signed)
[facility] Patient Name: Lori [name] Procedure Date: 03/01/2017 MRN: [id number] Attending MD: Milus Banister , MD Date of Birth: 1956-02-21 CSN: [id number] Age: 61 Admit Type: Outpatient Procedure:                Colonoscopy Indications:              High risk colon cancer surveillance: Personal                            history of colonic polyps; 08/2011 colonoscopy                            single small TA removed Providers:                Milus Banister, MD, Angus Seller, William Dalton, Technician Referring MD:              Medicines:                Monitored Anesthesia Care Complications:            No immediate complications. Estimated blood loss:                            None. Estimated Blood Loss:     Estimated blood loss: none. Procedure:                Pre-Anesthesia Assessment:                           - Prior to the procedure, a History and Physical                            was performed, and patient medications and                            allergies were reviewed. The patient's tolerance of                            previous anesthesia was also reviewed. The risks                            and benefits of the procedure and the sedation                            options and risks were discussed with the patient.                            All questions were answered, and informed consent                            was obtained. Prior Anticoagulants: The patient has                            taken no previous anticoagulant or antiplatelet  agents. ASA Grade Assessment: III - A patient with                            severe systemic disease. After reviewing the risks                            and benefits, the patient was deemed in                            satisfactory condition to undergo the procedure.                           After obtaining informed consent, the colonoscope                         was passed under direct vision. Throughout the                            procedure, the patient's blood pressure, pulse, and                            oxygen saturations were monitored continuously. The                            EC-[id number] ([id number]) scope was introduced through                            the anus and advanced to the the cecum, identified                            by appendiceal orifice and ileocecal valve. The                            colonoscopy was performed without difficulty. The                            patient tolerated the procedure well. The quality                            of the bowel preparation was good. The ileocecal                            valve, appendiceal orifice, and rectum were                            photographed but not all pictures saved due to                            technical issues with the computer. Scope In: Scope Out: Findings:      A 4 mm polyp was found in the transverse colon. The polyp was sessile.       The polyp was removed with a cold snare. Resection and retrieval were       complete.      The  exam was otherwise without abnormality on direct and retroflexion       views. Impression:               - One 4 mm polyp in the transverse colon, removed                            with a cold snare. Resected and retrieved.                           - The examination was otherwise normal on direct                            and retroflexion views. Moderate Sedation:      N/A- Per Anesthesia Care Recommendation:           - Patient has a contact number available for                            emergencies. The signs and symptoms of potential                            delayed complications were discussed with the                            patient. Return to normal activities tomorrow.                            Written discharge instructions were provided to the                            patient.                            - Resume previous diet.                           - Continue present medications. Continue daily                            miralax, it seems to be helping your constipation.                           You will receive a letter within 2-3 weeks with the                            pathology results and my final recommendations.                           If the polyp(s) is proven to be 'pre-cancerous' on                            pathology, you will need repeat colonoscopy in 5                            years. If the polyp(s) is  NOT 'precancerous' on                            pathology then you should repeat colon cancer                            screening in 10 years with colonoscopy without need                            for colon cancer screening by any method prior to                            then (including stool testing). Procedure Code(s):        --- Professional ---                           [phone], Colonoscopy, flexible; with removal of                            tumor(s), polyp(s), or other lesion(s) by snare                            technique Diagnosis Code(s):        --- Professional ---                           Z86.010, Personal history of colonic polyps                           D12.3, Benign neoplasm of transverse colon (hepatic                            flexure or splenic flexure) CPT copyright 2016 American Medical Association. All rights reserved. The codes documented in this report are preliminary and upon coder review may  be revised to meet current compliance requirements. Milus Banister, MD 03/01/2017 7:51:49 AM This report has been signed electronically. Number of Addenda: 0

## 2017-03-01 NOTE — Anesthesia Postprocedure Evaluation (Signed)
Anesthesia Post Note  Patient: Lori [name]  Procedure(s) Performed: COLONOSCOPY WITH PROPOFOL (N/A ) ESOPHAGOGASTRODUODENOSCOPY (EGD) WITH PROPOFOL (N/A )     Patient location during evaluation: PACU Anesthesia Type: MAC Level of consciousness: awake and alert Pain management: pain level controlled Vital Signs Assessment: post-procedure vital signs reviewed and stable Respiratory status: spontaneous breathing Cardiovascular status: stable Anesthetic complications: no    Last Vitals:  Vitals:   03/01/17 0820 03/01/17 0830  BP: (!) 118/59 (!) 141/67  Pulse: 66 69  Resp: 11 20  Temp:    SpO2: 100% 99%    Last Pain:  Vitals:   03/01/17 0810  TempSrc: Oral                 Nolon Nations

## 2017-03-01 NOTE — Discharge Instructions (Signed)
Monitored Anesthesia Care, Care After °These instructions provide you with information about caring for yourself after your procedure. Your health care provider may also give you more specific instructions. Your treatment has been planned according to current medical practices, but problems sometimes occur. Call your health care provider if you have any problems or questions after your procedure. °What can I expect after the procedure? °After your procedure, it is common to: °· Feel sleepy for several hours. °· Feel clumsy and have poor balance for several hours. °· Feel forgetful about what happened after the procedure. °· Have poor judgment for several hours. °· Feel nauseous or vomit. °· Have a sore throat if you had a breathing tube during the procedure. ° °Follow these instructions at home: °For at least 24 hours after the procedure: ° °· Do not: °? Participate in activities in which you could fall or become injured. °? Drive. °? Use heavy machinery. °? Drink alcohol. °? Take sleeping pills or medicines that cause drowsiness. °? Make important decisions or sign legal documents. °? Take care of children on your own. °· Rest. °Eating and drinking °· Follow the diet that is recommended by your health care provider. °· If you vomit, drink water, juice, or soup when you can drink without vomiting. °· Make sure you have little or no nausea before eating solid foods. °General instructions °· Have a responsible adult stay with you until you are awake and alert. °· Take over-the-counter and prescription medicines only as told by your health care provider. °· If you smoke, do not smoke without supervision. °· Keep all follow-up visits as told by your health care provider. This is important. °Contact a health care provider if: °· You keep feeling nauseous or you keep vomiting. °· You feel light-headed. °· You develop a rash. °· You have a fever. °Get help right away if: °· You have trouble breathing. °This information is  not intended to replace advice given to you by your health care provider. Make sure you discuss any questions you have with your health care provider. °Document Released: 08/15/2015 Document Revised: 12/15/2015 Document Reviewed: 08/15/2015 °Elsevier Interactive Patient Education © 2018 Else[employer] °YOU HAD AN ENDOSCOPIC PROCEDURE TODAY: Refer to the procedure report that was given to you for any specific questions about what was found during the examination.  If the procedure report does not answer your questions, please call your gastroenterologist to clarify. ° °YOU SHOULD EXPECT: Some feelings of bloating in the abdomen. Passage of more gas than usual.  Walking can help get rid of the air that was put into your GI tract during the procedure and reduce the bloating. If you had a lower endoscopy (such as a colonoscopy or flexible sigmoidoscopy) you may notice spotting of blood in your stool or on the toilet paper.  ° °DIET: Your first meal following the procedure should be a light meal and then it is ok to progress to your normal diet.  A half-sandwich or bowl of soup is an example of a good first meal.  Heavy or fried foods are harder to digest and may make you feel nasueas or bloated.  Drink plenty of fluids but you should avoid alcoholic beverages for 24 hours. ° °ACTIVITY: Your care partner should take you home directly after the procedure.  You should plan to take it easy, moving slowly for the rest of the day.  You can resume normal activity the day after the procedure however you should NOT DRIVE or use   heavy machinery for 24 hours (because of the sedation medicines used during the test).   ° °SYMPTOMS TO REPORT IMMEDIATELY  °A gastroenterologist can be reached at any hour.  Please call your doctor's office for any of the following symptoms: ° °· Following lower endoscopy (colonoscopy, flexible sigmoidoscopy) ° Excessive amounts of blood in the stool ° Significant tenderness, worsening of abdominal  pains ° Swelling of the abdomen that is new, acute ° Fever of 100° or higher °· Following upper endoscopy (EGD, EUS, ERCP) ° Vomiting of blood or coffee ground material ° New, significant abdominal pain ° New, significant chest pain or pain under the shoulder blades ° Painful or persistently difficult swallowing ° New shortness of breath ° Black, tarry-looking stools ° °FOLLOW UP: °If any biopsies were taken you will be contacted by phone or by letter within the next 1-3 weeks.  Call your gastroenterologist if you have not heard about the biopsies in 3 weeks.  °Please also call your gastroenterologist's office with any specific questions about appointments or follow up tests. ° °

## 2017-03-02 ENCOUNTER — Encounter (HOSPITAL_COMMUNITY): Payer: Self-pay | Admitting: Gastroenterology

## 2017-03-05 ENCOUNTER — Other Ambulatory Visit: Payer: Self-pay

## 2017-03-05 MED ORDER — BIS SUBCIT-METRONID-TETRACYC 140-125-125 MG PO CAPS: 3.0000 | ORAL_CAPSULE | Freq: Three times a day (TID) | ORAL | 0 refills | Status: DC

## 2017-03-05 MED ORDER — PANTOPRAZOLE SODIUM 40 MG PO TBEC: 40.0000 mg | DELAYED_RELEASE_TABLET | Freq: Two times a day (BID) | ORAL | 6 refills | Status: AC

## 2017-03-12 ENCOUNTER — Ambulatory Visit
Admission: RE | Admit: 2017-03-12 | Discharge: 2017-03-12 | Disposition: A | Discharge status: Home / Self Care | Payer: Medicaid Other | Source: Ambulatory Visit | Attending: Adult Health | Admitting: Adult Health

## 2017-03-12 DIAGNOSIS — R599 Enlarged lymph nodes, unspecified: Secondary | ICD-10-CM

## 2017-03-26 ENCOUNTER — Other Ambulatory Visit (INDEPENDENT_AMBULATORY_CARE_PROVIDER_SITE_OTHER): Payer: Self-pay | Admitting: Orthopedic Surgery

## 2017-05-07 ENCOUNTER — Other Ambulatory Visit (INDEPENDENT_AMBULATORY_CARE_PROVIDER_SITE_OTHER): Payer: Self-pay | Admitting: Family

## 2017-05-07 MED ORDER — COLCHICINE 0.6 MG PO CAPS: 1.0000 | ORAL_CAPSULE | Freq: Every day | ORAL | 2 refills | Status: DC | PRN

## 2017-05-14 ENCOUNTER — Ambulatory Visit: Payer: Medicaid Other | Admitting: Gastroenterology

## 2017-05-29 ENCOUNTER — Other Ambulatory Visit (INDEPENDENT_AMBULATORY_CARE_PROVIDER_SITE_OTHER): Payer: Self-pay | Admitting: Radiology

## 2017-05-29 DIAGNOSIS — G5793 Unspecified mononeuropathy of bilateral lower limbs: Secondary | ICD-10-CM

## 2017-05-30 ENCOUNTER — Telehealth (INDEPENDENT_AMBULATORY_CARE_PROVIDER_SITE_OTHER): Payer: Self-pay | Admitting: Radiology

## 2017-05-30 MED ORDER — COLCHICINE 0.6 MG PO CAPS: 1.0000 | ORAL_CAPSULE | Freq: Every day | ORAL | 2 refills | Status: DC | PRN

## 2017-05-30 MED ORDER — GABAPENTIN 300 MG PO CAPS: 300.0000 mg | ORAL_CAPSULE | Freq: Every day | ORAL | 2 refills | Status: DC

## 2017-05-30 NOTE — Telephone Encounter (Signed)
Sent into pharmacy to fill for mitigare, the preferred drug.

## 2017-07-03 ENCOUNTER — Ambulatory Visit: Payer: Medicaid Other | Admitting: Gastroenterology

## 2017-07-05 ENCOUNTER — Other Ambulatory Visit: Payer: Self-pay | Admitting: Adult Health

## 2017-07-05 DIAGNOSIS — Z139 Encounter for screening, unspecified: Secondary | ICD-10-CM

## 2017-07-26 ENCOUNTER — Telehealth: Payer: Self-pay | Admitting: Gastroenterology

## 2017-07-26 NOTE — Telephone Encounter (Signed)
Patient states she is having a burning feeling on her left upper side and wants advice on what to do.

## 2017-07-26 NOTE — Telephone Encounter (Signed)
The pt has increasing reflux, she has not been taking her pantoprazole as prescribed.  She was advised to take it BID and also add zantac at bedtime.  She will call back if she has no relief.

## 2017-08-13 ENCOUNTER — Ambulatory Visit: Payer: Medicaid Other

## 2017-09-03 ENCOUNTER — Ambulatory Visit: Payer: Medicaid Other

## 2017-09-21 ENCOUNTER — Ambulatory Visit
Admission: RE | Admit: 2017-09-21 | Discharge: 2017-09-21 | Disposition: A | Discharge status: Home / Self Care | Payer: Medicaid Other | Source: Ambulatory Visit | Attending: Adult Health | Admitting: Adult Health

## 2017-09-21 DIAGNOSIS — Z139 Encounter for screening, unspecified: Secondary | ICD-10-CM

## 2017-11-01 ENCOUNTER — Other Ambulatory Visit: Payer: Self-pay | Admitting: Adult Health

## 2017-11-01 DIAGNOSIS — R5381 Other malaise: Secondary | ICD-10-CM

## 2017-11-07 ENCOUNTER — Other Ambulatory Visit (INDEPENDENT_AMBULATORY_CARE_PROVIDER_SITE_OTHER): Payer: Self-pay | Admitting: Family

## 2017-11-07 DIAGNOSIS — G5793 Unspecified mononeuropathy of bilateral lower limbs: Secondary | ICD-10-CM

## 2017-11-09 NOTE — Telephone Encounter (Signed)
Ok for refill? 

## 2017-11-20 ENCOUNTER — Other Ambulatory Visit: Payer: Self-pay | Admitting: Adult Health

## 2017-11-20 DIAGNOSIS — E2839 Other primary ovarian failure: Secondary | ICD-10-CM

## 2017-11-27 ENCOUNTER — Telehealth: Payer: Self-pay | Admitting: Gastroenterology

## 2017-11-27 ENCOUNTER — Encounter (HOSPITAL_COMMUNITY): Payer: Self-pay

## 2017-11-27 ENCOUNTER — Emergency Department (HOSPITAL_COMMUNITY): Payer: Medicaid Other

## 2017-11-27 ENCOUNTER — Emergency Department (HOSPITAL_COMMUNITY)
Admission: EM | Admit: 2017-11-27 | Discharge: 2017-11-27 | Disposition: A | Discharge status: Home / Self Care | Payer: Medicaid Other | Attending: Emergency Medicine | Admitting: Emergency Medicine

## 2017-11-27 DIAGNOSIS — R1011 Right upper quadrant pain: Secondary | ICD-10-CM | POA: Diagnosis present

## 2017-11-27 DIAGNOSIS — I10 Essential (primary) hypertension: Secondary | ICD-10-CM | POA: Diagnosis not present

## 2017-11-27 DIAGNOSIS — Z8541 Personal history of malignant neoplasm of cervix uteri: Secondary | ICD-10-CM | POA: Diagnosis not present

## 2017-11-27 DIAGNOSIS — R1013 Epigastric pain: Secondary | ICD-10-CM | POA: Diagnosis not present

## 2017-11-27 DIAGNOSIS — Z79899 Other long term (current) drug therapy: Secondary | ICD-10-CM | POA: Insufficient documentation

## 2017-11-27 DIAGNOSIS — Z87891 Personal history of nicotine dependence: Secondary | ICD-10-CM | POA: Insufficient documentation

## 2017-11-27 LAB — URINALYSIS, ROUTINE W REFLEX MICROSCOPIC
Bacteria, UA: NONE SEEN
Bilirubin Urine: NEGATIVE
Glucose, UA: NEGATIVE mg/dL
Ketones, ur: 5 mg/dL — AB
Leukocytes, UA: NEGATIVE
Nitrite: NEGATIVE
Protein, ur: NEGATIVE mg/dL
Specific Gravity, Urine: 1.004 — ABNORMAL LOW (ref 1.005–1.030)
pH: 6 (ref 5.0–8.0)

## 2017-11-27 LAB — LIPASE, BLOOD: Lipase: 48 U/L (ref 11–51)

## 2017-11-27 LAB — CBC
HCT: 38.4 % (ref 36.0–46.0)
Hemoglobin: 12.4 g/dL (ref 12.0–15.0)
MCH: 31.4 pg (ref 26.0–34.0)
MCHC: 32.3 g/dL (ref 30.0–36.0)
MCV: 97.2 fL (ref 78.0–100.0)
Platelets: 288 10*3/uL (ref 150–400)
RBC: 3.95 MIL/uL (ref 3.87–5.11)
RDW: 12.5 % (ref 11.5–15.5)
WBC: 12.2 10*3/uL — ABNORMAL HIGH (ref 4.0–10.5)

## 2017-11-27 LAB — COMPREHENSIVE METABOLIC PANEL
ALT: 9 U/L (ref 0–44)
AST: 15 U/L (ref 15–41)
Albumin: 3.8 g/dL (ref 3.5–5.0)
Alkaline Phosphatase: 106 U/L (ref 38–126)
Anion gap: 14 (ref 5–15)
BUN: 6 mg/dL — ABNORMAL LOW (ref 8–23)
CO2: 21 mmol/L — ABNORMAL LOW (ref 22–32)
Calcium: 9.5 mg/dL (ref 8.9–10.3)
Chloride: 101 mmol/L (ref 98–111)
Creatinine, Ser: 0.92 mg/dL (ref 0.44–1.00)
GFR calc Af Amer: >60 mL/min (ref >60)
GFR calc non Af Amer: >60 mL/min (ref >60)
Glucose, Bld: 131 mg/dL — ABNORMAL HIGH (ref 70–99)
Potassium: 4.1 mmol/L (ref 3.5–5.1)
Sodium: 136 mmol/L (ref 135–145)
Total Bilirubin: 0.9 mg/dL (ref 0.3–1.2)
Total Protein: 7.6 g/dL (ref 6.5–8.1)

## 2017-11-27 MED ORDER — SUCRALFATE 1 G PO TABS: 1.0000 g | ORAL_TABLET | Freq: Three times a day (TID) | ORAL | 0 refills | Status: DC

## 2017-11-27 MED ORDER — MORPHINE SULFATE (PF) 4 MG/ML IV SOLN
4.0000 mg | Freq: Once | INTRAVENOUS | Status: AC
  Administered 2017-11-27: 4 mg via INTRAVENOUS
  Filled 2017-11-27: qty 1

## 2017-11-27 MED ORDER — GI COCKTAIL ~~LOC~~
30.0000 mL | Freq: Once | ORAL | Status: AC
  Administered 2017-11-27: 30 mL via ORAL
  Filled 2017-11-27: qty 30

## 2017-11-27 MED ORDER — IOHEXOL 300 MG/ML  SOLN
100.0000 mL | Freq: Once | INTRAMUSCULAR | Status: AC | PRN
  Administered 2017-11-27: 100 mL via INTRAVENOUS

## 2017-11-27 NOTE — Telephone Encounter (Signed)
Pt is requesting to be seen asap. She was just d/c from [facility] ED, they told her that they cannot do anything for her and she needs to see Dr. Ardis Hughs. She stated to have upper abd pain and it feels like "it is burning." Pls call her.

## 2017-11-27 NOTE — Telephone Encounter (Signed)
Left message on machine to call back  

## 2017-11-27 NOTE — ED Triage Notes (Signed)
Pt. Arrived by EMS from home with reports of right upper quadrant pain that radiates to umbilical and ribs X2 days. Pt. Reports having a hx. Of pancreatitis and reports this feeling the same. Pt. Reports diarrhea 2 days ago but has resolved. A/O X4

## 2017-11-27 NOTE — Discharge Instructions (Signed)
Please continue to take your medication as previously prescribed.  Use Carafate with each meal as it can help you with your abdominal pain.  Call and follow-up with your GI specialist for further care.  Return if you have any concern.

## 2017-11-27 NOTE — ED Provider Notes (Signed)
[place] EMERGENCY DEPARTMENT Provider Note   CSN: [id number] Arrival date & time: 11/27/17  6269     History   Chief Complaint No chief complaint on file.   HPI Lori [name] is a 62 y.o. female.  The history is provided by the patient. No language interpreter was used.     62 year old female with history of polysubstance abuse, GERD, pancreatitis, hypertension, urine cancer presenting for evaluation of abdominal pain.  Patient developed burning sharp sensation to her mid upper abdomen which started yesterday and has been persistent.  Pain is moderate to severe, endorsed nausea without vomiting and having subjective fever and chills.  Pain not adequately relieved despite taking 1 dose of ibuprofen 800 mg and drinking fluids.  She also endorsed a nonproductive cough that started about 3 hours ago.  She mentioned symptoms felt similar to prior pancreatitis that has not alcohol-related.  She endorsed diarrhea 2 days prior but normal bowel movement since.  She denies any urinary symptoms.  She mentioned had a recent colonoscopy earlier last year and was told that she have a "mask".  GI is Dr. Edison Nasuti.  She denies persistent use of NSAIDs, and only take ibuprofen once recently.   Past Medical History:  Diagnosis Date  . Arthritis   . Depression   . Depression with anxiety 2008-08-13   Patient had multiple death in her family over a short period of time. father, brother, uncle and niece who was stabbed 3 years ago. Patient's husband had a car accident last year and  need currently a feeding tube.   Marland Kitchen GERD (gastroesophageal reflux disease)   . Gout   . Headache(784.0)    migraines  . History of cervical cancer  1985    status post partial hysterectomy , last Pap smear 10 years ago , no further followup  . History of cocaine abuse 10/23/2008    last documented in Aug 13, 2005 when admitted for hypertensive urgency  . Hypertension 13-Aug-2005   Admitted for HTN crisis in 10/2008. Was given  in 08-13-08 a wrong prescription from the pharmacy and per ED note it was Lisinopril-Hydrocholorthizide which gave her a  hives and feeling of sickness. Patient was started  on this meds on 05/31/2010 by Dr  Ihor Gully without any problem.   . Insomnia   . Tobacco abuse     35 years  . Uterine cancer [facility])     Patient Active Problem List   Diagnosis Date Noted  . Hx of adenomatous colonic polyps   . Benign neoplasm of transverse colon   . Bloating   . Gastritis and gastroduodenitis   . Bilateral foot pain 01/15/2017  . 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 Normocytic anemia 09/12/2016  . Hyponatremia 09/12/2016  . Acute pancreatitis 09/11/2016  . Shin splints, sequela 08/16/2016  . Cellulitis of groin 01/05/2016  . Encounter for screening mammogram for breast cancer 07/02/2015  . 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 Allergic rhinitis 11/27/2014  . Otitis media of right ear 07/01/2012  . Cough 11/20/2011  . Renal lesion 06/12/2011  . Nausea 05/10/2011  . Caries 04/12/2011  . Abdominal pain 10/25/2010  . Preventive measure 10/25/2010  . Shoulder pain, left 10/25/2010  . Leg pain 06/20/2010  . CERVICAL CANCER 10/23/2008  . TOBACCO ABUSE 10/23/2008  . Depression with anxiety 10/23/2008  . Essential hypertension 10/23/2008  . GERD 10/23/2008  . Insomnia 10/23/2008  . SNORING 10/23/2008    Past Surgical History:  Procedure  Laterality Date  . CESAREAN SECTION  1985  . COLONOSCOPY WITH PROPOFOL N/A 03/01/2017   Procedure: COLONOSCOPY WITH PROPOFOL;  Surgeon: Milus Banister, MD;  Location: [facility] ENDOSCOPY;  Service: Endoscopy;  Laterality: N/A;  . ESOPHAGOGASTRODUODENOSCOPY (EGD) WITH PROPOFOL N/A 03/01/2017   Procedure: ESOPHAGOGASTRODUODENOSCOPY (EGD) WITH PROPOFOL;  Surgeon: Milus Banister, MD;  Location: [facility] ENDOSCOPY;  Service: Endoscopy;  Laterality: N/A;  . PARTIAL HYSTERECTOMY    . SKIN GRAFT       OB History   None      Home Medications     Prior to Admission medications   Medication Sig Start Date End Date Taking? Authorizing Provider  bismuth-metronidazole-tetracycline [facility]) [phone] MG capsule Take 3 capsules by mouth 4 (four) times daily -  before meals and at bedtime. 03/05/17 03/19/17  Milus Banister, MD  Colchicine 0.6 MG CAPS Take 1 capsule by mouth daily as needed. 05/30/17   Suzan Slick, NP  diphenhydramine-acetaminophen (TYLENOL PM) 25-500 MG TABS tablet Take 2 tablets by mouth at bedtime as needed (for sleep).    [provider]  gabapentin (NEURONTIN) 300 MG capsule TAKE 1 CAPSULE BY MOUTH EVERYDAY AT BEDTIME 11/09/17   Newt Minion, MD  lisinopril (PRINIVIL,ZESTRIL) 20 MG tablet TAKE 1 TABLET (20 MG TOTAL) BY MOUTH DAILY. 07/07/16   Burgess Estelle, MD  NICOTINE STEP 2 14 MG/24HR patch Place 1 patch onto the skin daily. 01/11/17   [provider]  ondansetron (ZOFRAN) 4 MG tablet Take 1 tablet (4 mg total) by mouth every 6 (six) hours as needed for nausea. 09/12/16   Janece Canterbury, MD  pantoprazole (PROTONIX) 40 MG tablet Take 1 tablet (40 mg total) by mouth 2 (two) times daily. 03/05/17 04/04/17  Milus Banister, MD  predniSONE (DELTASONE) 10 MG tablet Take 1 tablet (10 mg total) by mouth daily with breakfast. 01/15/17   Newt Minion, MD  QUEtiapine (SEROQUEL) 25 MG tablet Take 25 mg by mouth at bedtime.  12/15/16   [provider]  sucralfate (CARAFATE) 1 g tablet Take 1 g by mouth 2 (two) times daily.     [provider]  traZODone (DESYREL) 100 MG tablet Take 50 mg by mouth at bedtime.  12/15/16   [provider]  zolpidem (AMBIEN) 5 MG tablet Take 5 mg by mouth at bedtime.  12/06/16   [provider]    Family History Family History  Problem Relation Age of Onset  . Thyroid disease Mother   . Diabetes Father   . Heart disease Father   . Hyperlipidemia Father   . Hypertension Father   . Diabetes Brother   . Hypertension Brother   . Hyperlipidemia  Brother   . Colon polyps Brother   . Thyroid disease Daughter   . Obesity Daughter   . Mental illness Daughter   . Colon cancer Maternal Uncle     Social History Social History   Tobacco Use  . Smoking status: Former Smoker    Packs/day: 0.50    Years: 43.00    Pack years: 21.50    Types: Cigarettes    Last attempt to quit: 09/05/2016    Years since quitting: 1.2  . Smokeless tobacco: Never Used  . Tobacco comment: patches have helped in the past  Substance Use Topics  . Alcohol use: No  . Drug use: No     Allergies   Patient has no known allergies.   Review of Systems Review of Systems  All other systems reviewed and are negative.    Physical Exam Updated Vital Signs BP (!) 162/102   Pulse (!) 103   Temp 98.9 F (37.2 C) (Oral)   Resp 20   Ht 5\' 3"  (1.6 m)   Wt 81.6 kg (180 lb)   SpO2 98%   BMI 31.89 kg/m   Physical Exam  Constitutional: She appears well-developed and well-nourished. No distress.  Patient appears uncomfortable.  HENT:  Head: Atraumatic.  Eyes: Conjunctivae are normal.  Neck: Neck supple.  Cardiovascular: Normal rate and regular rhythm.  Pulmonary/Chest: Effort normal. She has wheezes (Very faint wheeze heard).  Abdominal: Soft. Bowel sounds are normal. She exhibits no distension. There is tenderness (Tenderness to the epigastric and periumbilical region on palpation without guarding or rebound tenderness.).  Neurological: She is alert.  Skin: No rash noted.  Psychiatric: She has a normal mood and affect.  Nursing note and vitals reviewed.    ED Treatments / Results  Labs (all labs ordered are listed, but only abnormal results are displayed) Labs Reviewed  COMPREHENSIVE METABOLIC PANEL - Abnormal; Notable for the following components:      Result Value   CO2 21 (*)    Glucose, Bld 131 (*)    BUN 6 (*)    All other components within normal limits  CBC - Abnormal; Notable for the following components:   WBC 12.2 (*)    All  other components within normal limits  URINALYSIS, ROUTINE W REFLEX MICROSCOPIC - Abnormal; Notable for the following components:   Color, Urine STRAW (*)    Specific Gravity, Urine 1.004 (*)    Hgb urine dipstick SMALL (*)    Ketones, ur 5 (*)    All other components within normal limits  LIPASE, BLOOD    EKG None  Radiology Dg Chest 2 View  Result Date: 11/27/2017 CLINICAL DATA:  Cough EXAM: CHEST - 2 VIEW COMPARISON:  August 25, 2005 FINDINGS: There is no edema or consolidation. The heart size and pulmonary vascularity are normal. No adenopathy. There is degenerative change in the thoracic spine. IMPRESSION: No edema or consolidation. Electronically Signed   By: Lowella Grip III M.D.   On: 11/27/2017 07:07   Ct Abdomen Pelvis W Contrast  Result Date: 11/27/2017 CLINICAL DATA:  Right upper quadrant abdominal pain EXAM: CT ABDOMEN AND PELVIS WITH CONTRAST TECHNIQUE: Multidetector CT imaging of the abdomen and pelvis was performed using the standard protocol following bolus administration of intravenous contrast. CONTRAST:  19mL OMNIPAQUE IOHEXOL 300 MG/ML  SOLN COMPARISON:  None. FINDINGS: Lower chest: Lung bases are clear. No effusions. Heart is normal size. Hepatobiliary: No focal hepatic abnormality. Gallbladder unremarkable. Pancreas: No focal abnormality or ductal dilatation. Spleen: No focal abnormality.  Normal size. Adrenals/Urinary Tract: Exophytic cysts off the mid to lower pole of the left kidney. No hydronephrosis bilaterally. Adrenal glands and urinary bladder unremarkable. Stomach/Bowel: Stomach, large and small bowel grossly unremarkable. Appendix is normal. Vascular/Lymphatic: Aortic atherosclerosis. No enlarged abdominal or pelvic lymph nodes. Reproductive: Prior hysterectomy.  No adnexal masses. Other: No free fluid or free air. Musculoskeletal: Degenerative changes in the lower lumbar spine. IMPRESSION: No acute findings in the abdomen or pelvis. Aortic atherosclerosis.  Electronically Signed   By: Rolm Baptise M.D.   On: 11/27/2017 08:22    Procedures Procedures (including critical care time)  Medications Ordered in ED Medications  morphine 4 MG/ML injection 4 mg (has no administration in time range)  gi cocktail (Maalox,Lidocaine,Donnatal) (30 mLs Oral Given  11/27/17 0650)  iohexol (OMNIPAQUE) 300 MG/ML solution 100 mL (100 mLs Intravenous Contrast Given 11/27/17 0802)     Initial Impression / Assessment and Plan / ED Course  I have reviewed the triage vital signs and the nursing notes.  Pertinent labs & imaging results that were available during my care of the patient were reviewed by me and considered in my medical decision making (see chart for details).     BP 101/72   Pulse 99   Temp 98.9 F (37.2 C) (Oral)   Resp 19   Ht 5\' 3"  (1.6 m)   Wt 81.6 kg (180 lb)   SpO2 94%   BMI 31.89 kg/m    Final Clinical Impressions(s) / ED Diagnoses   Final diagnoses:  Epigastric pain    ED Discharge Orders    None     6:38 AM Patient here with epigastric pain and nausea.  Described as burning sensation.  Suspect gastritis causing his symptoms.  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  She would benefit from an abdominal pelvic CT scan for further evaluation.  GI cocktail given for comfort.  9:10 AM Urine without signs of urinary tract infection.  Normal lipase.  Electrolytes panel are reassuring.  Mildly elevated white count of 12.2.  Nonspecific.  Chest x-ray is unremarkable, abdominal and pelvis CT scan without any abnormal finding.  Suspect gastritis causing her symptoms.  Patient discharged home with Prilosec, and Zantac.  No complaints of shortness of breath to suggest PE.  Symptoms not consistent with ACS.  Encourage patient to avoid NSAIDs and follow-up with a GI specialist, Dr. Edison Nasuti for further evaluation of her condition.  Will prescribe Carafate.  Return precautions discussed.   Domenic Moras, PA-C 11/27/17  0945    Jola Schmidt, MD 11/27/17 [phone]

## 2017-11-28 NOTE — Telephone Encounter (Signed)
Pt has appt scheduled now.

## 2017-11-28 NOTE — Telephone Encounter (Signed)
Pt called today again asking for advice for her pain. I schd an appt with Amy Esterwood for 12/14/17 @ 10:30am

## 2017-12-09 IMAGING — US US ABDOMEN COMPLETE
1 series · 14 of 25 positions shown · non-contrast
Comparison: CT scan of September 11, 2016.

CLINICAL DATA: Abdominal bloating and pain.

EXAM:
ABDOMEN ULTRASOUND COMPLETE

[Series 1: us abdomen complete · 0.23mm/px · 14 of 100 slices shown]
[im 1/100]
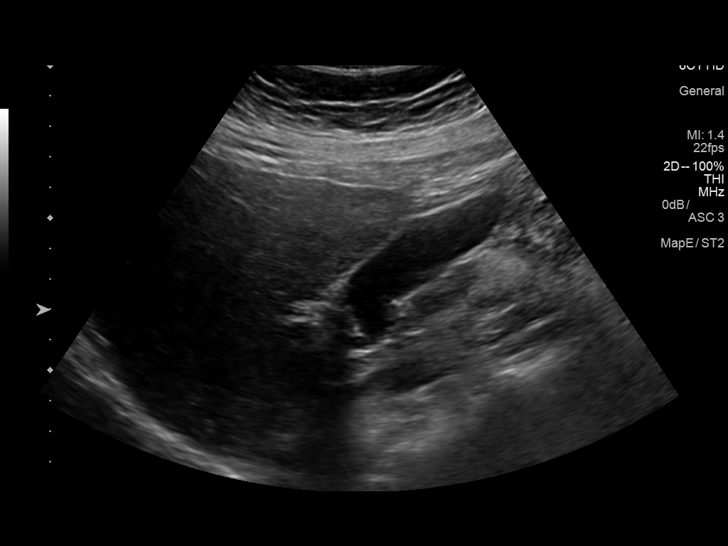
[im 9/100]
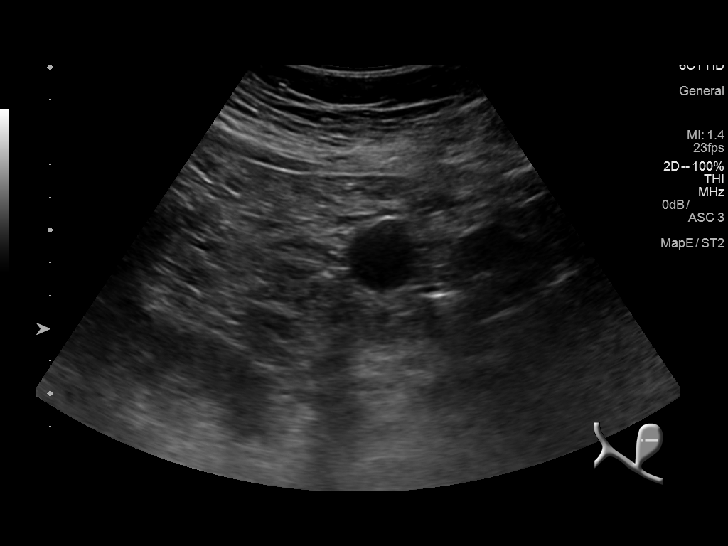
[im 17/100]
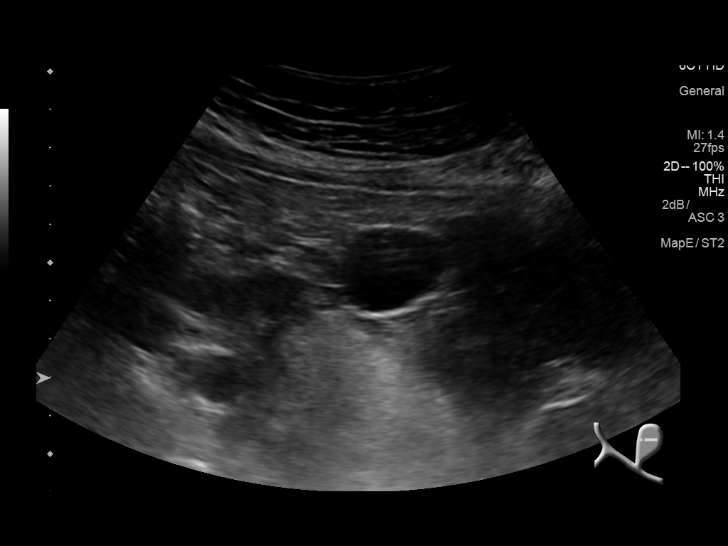
[im 25/100]
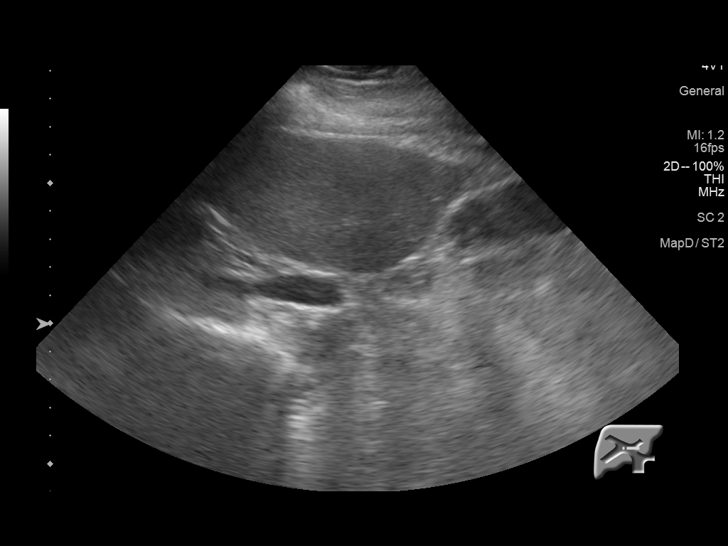
[im 34/100]
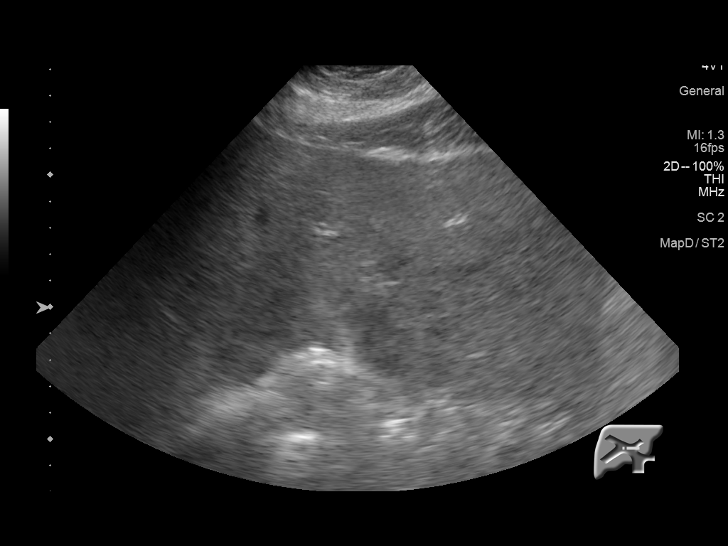
[im 38/100]
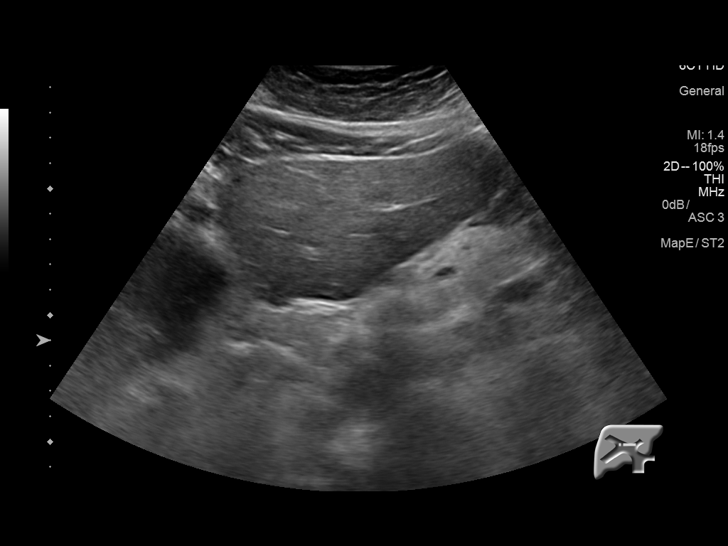
[im 46/100]
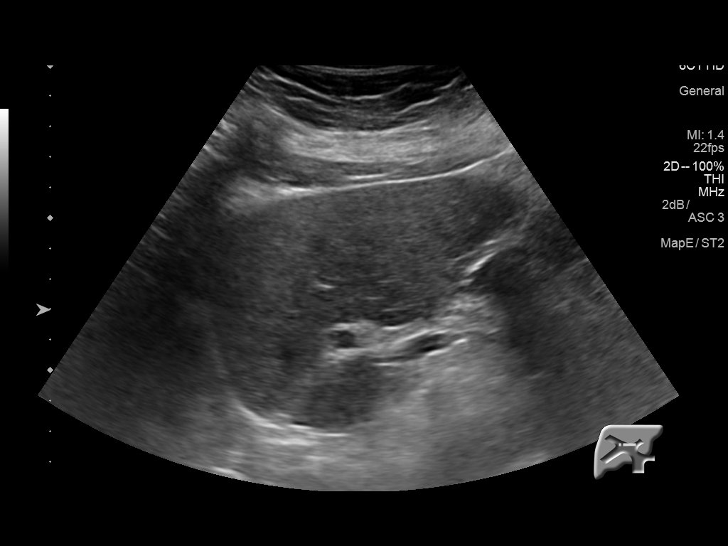
[im 54/100]
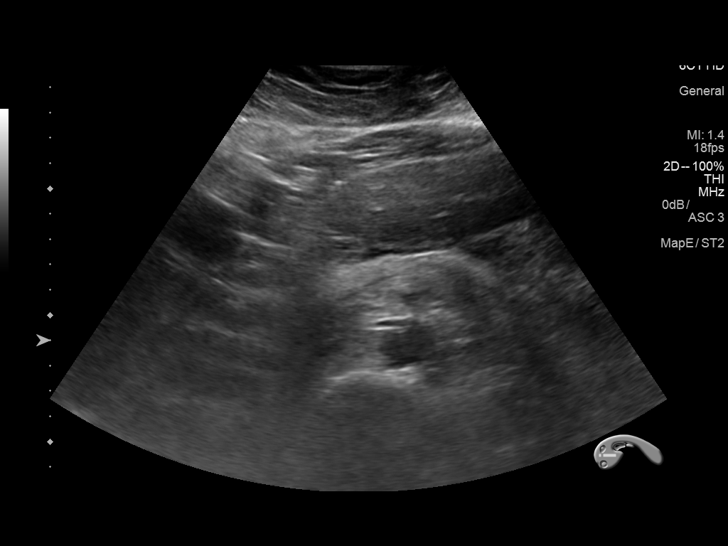
[im 62/100]
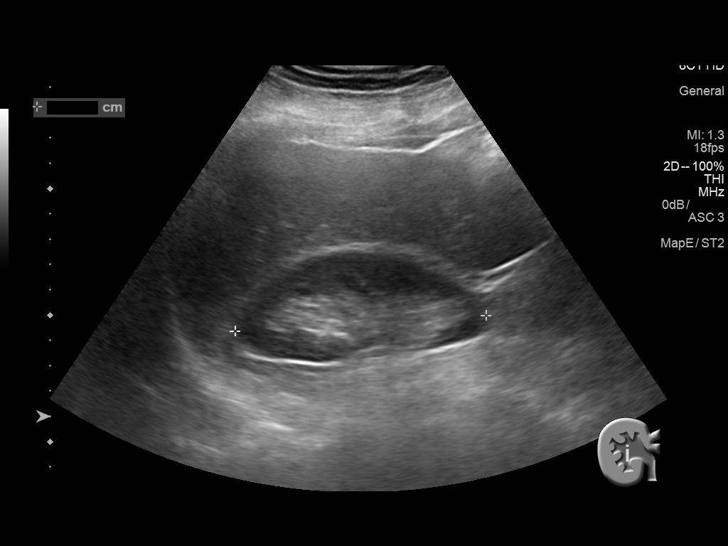
[im 67/100]
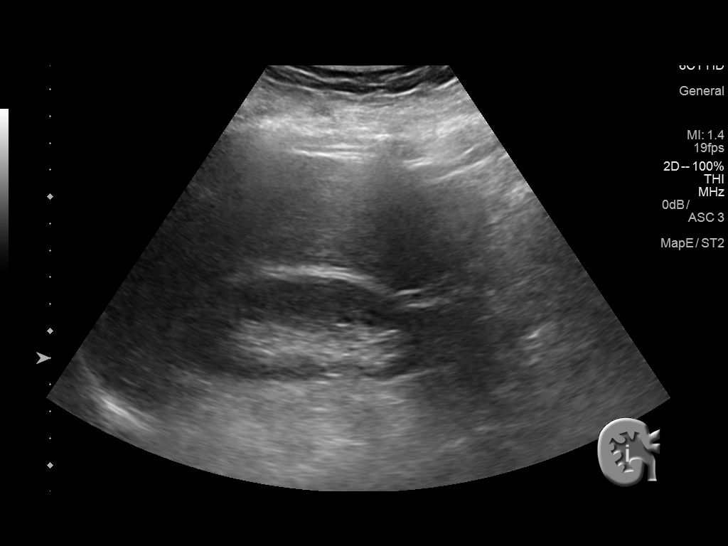
[im 75/100]
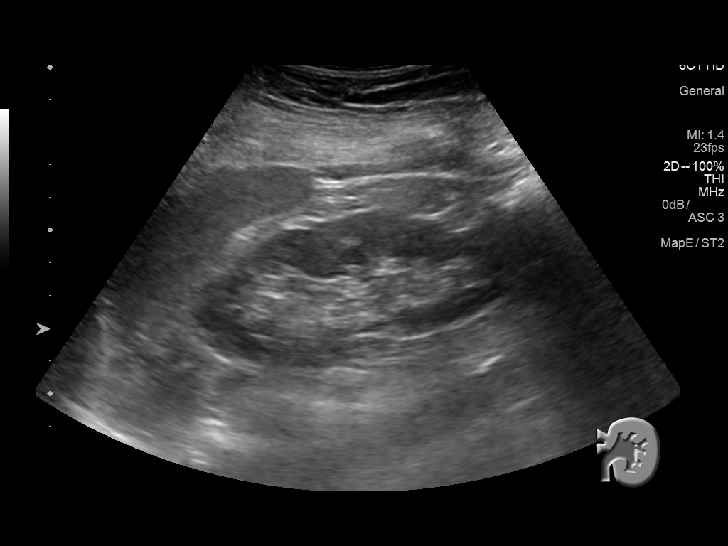
[im 83/100]
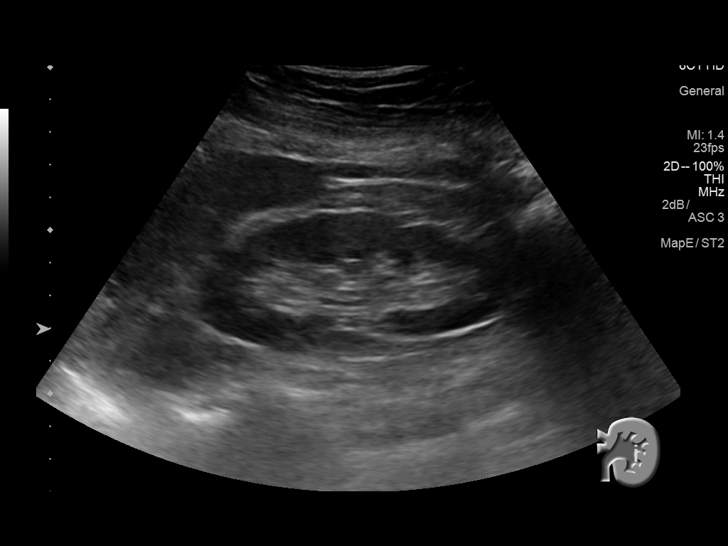
[im 91/100]
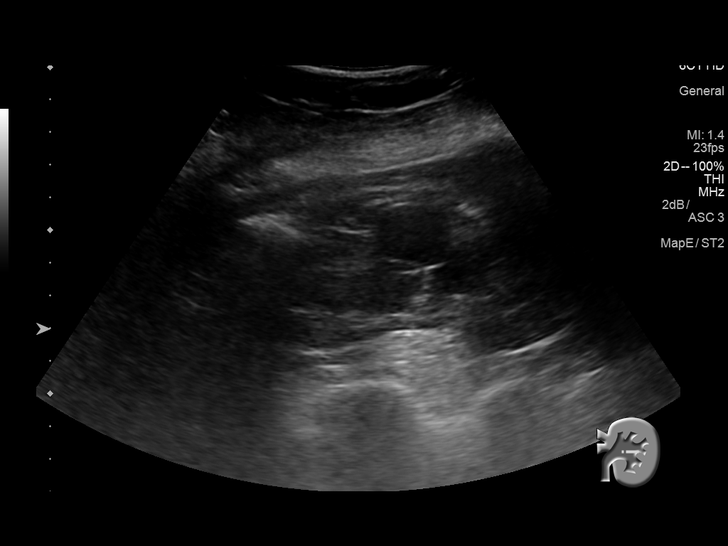
[im 100/100]
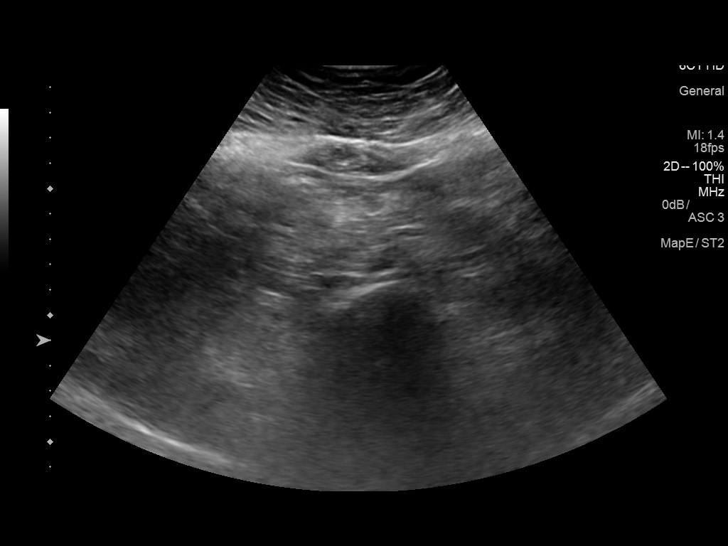

[14 of 25 positions shown; findings below may reference images not displayed]

FINDINGS: Gallbladder: No gallstones or wall thickening visualized. No
sonographic Murphy sign noted by sonographer.

Common bile duct: Diameter: 2.5 mm which is within normal limits.

Liver: No focal lesion identified. Within normal limits in
parenchymal echogenicity. Portal vein is patent on color Doppler
imaging with normal direction of blood flow towards the liver.

IVC: No abnormality visualized.

Pancreas: Visualized portion unremarkable.

Spleen: Size and appearance within normal limits.

Right Kidney: Length: 9.9 cm. Echogenicity within normal limits. No
mass or hydronephrosis visualized.

Left Kidney: Length: 9.8 cm. Two simple cysts are noted, with the
largest measuring 2.8 cm. Echogenicity within normal limits. No mass
or hydronephrosis visualized.

Abdominal aorta: No aneurysm visualized.

Other findings: None.
IMPRESSION: No significant abnormality seen in the abdomen.

## 2017-12-14 ENCOUNTER — Ambulatory Visit: Payer: Medicaid Other | Admitting: Physician Assistant

## 2017-12-27 ENCOUNTER — Ambulatory Visit: Payer: Medicaid Other | Admitting: Physician Assistant

## 2017-12-27 ENCOUNTER — Encounter: Payer: Self-pay | Admitting: Physician Assistant

## 2017-12-27 ENCOUNTER — Other Ambulatory Visit (INDEPENDENT_AMBULATORY_CARE_PROVIDER_SITE_OTHER): Payer: Medicaid Other

## 2017-12-27 ENCOUNTER — Encounter (INDEPENDENT_AMBULATORY_CARE_PROVIDER_SITE_OTHER): Payer: Self-pay

## 2017-12-27 VITALS — BP 98/52 | HR 96 | Ht 62.5 in | Wt 173.0 lb

## 2017-12-27 DIAGNOSIS — R1011 Right upper quadrant pain: Secondary | ICD-10-CM | POA: Diagnosis not present

## 2017-12-27 DIAGNOSIS — R110 Nausea: Secondary | ICD-10-CM | POA: Diagnosis not present

## 2017-12-27 LAB — CBC WITH DIFFERENTIAL/PLATELET
Basophils Absolute: 0.1 10*3/uL (ref 0.0–0.1)
Basophils Relative: 1 % (ref 0.0–3.0)
Eosinophils Absolute: 0.3 10*3/uL (ref 0.0–0.7)
Eosinophils Relative: 3.6 % (ref 0.0–5.0)
HCT: 33.3 % — ABNORMAL LOW (ref 36.0–46.0)
Hemoglobin: 11.4 g/dL — ABNORMAL LOW (ref 12.0–15.0)
Lymphocytes Relative: 51.8 % — ABNORMAL HIGH (ref 12.0–46.0)
Lymphs Abs: 3.9 10*3/uL (ref 0.7–4.0)
MCHC: 34.2 g/dL (ref 30.0–36.0)
MCV: 94.6 fl (ref 78.0–100.0)
Monocytes Absolute: 0.4 10*3/uL (ref 0.1–1.0)
Monocytes Relative: 5.8 % (ref 3.0–12.0)
Neutro Abs: 2.9 10*3/uL (ref 1.4–7.7)
Neutrophils Relative %: 37.8 % — ABNORMAL LOW (ref 43.0–77.0)
Platelets: 295 10*3/uL (ref 150.0–400.0)
RBC: 3.52 Mil/uL — ABNORMAL LOW (ref 3.87–5.11)
RDW: 13.2 % (ref 11.5–15.5)
WBC: 7.6 10*3/uL (ref 4.0–10.5)

## 2017-12-27 LAB — COMPREHENSIVE METABOLIC PANEL
ALT: 7 U/L (ref 0–35)
AST: 12 U/L (ref 0–37)
Albumin: 4.3 g/dL (ref 3.5–5.2)
Alkaline Phosphatase: 96 U/L (ref 39–117)
BUN: 15 mg/dL (ref 6–23)
CO2: 27 mEq/L (ref 19–32)
Calcium: 10 mg/dL (ref 8.4–10.5)
Chloride: 101 mEq/L (ref 96–112)
Creatinine, Ser: 1.43 mg/dL — ABNORMAL HIGH (ref 0.40–1.20)
GFR: 39.61 mL/min — ABNORMAL LOW (ref >60.00)
Glucose, Bld: 105 mg/dL — ABNORMAL HIGH (ref 70–99)
Potassium: 4.1 mEq/L (ref 3.5–5.1)
Sodium: 134 mEq/L — ABNORMAL LOW (ref 135–145)
Total Bilirubin: 0.6 mg/dL (ref 0.2–1.2)
Total Protein: 7.9 g/dL (ref 6.0–8.3)

## 2017-12-27 LAB — SEDIMENTATION RATE: Sed Rate: 100 mm/hr — ABNORMAL HIGH (ref 0–30)

## 2017-12-27 LAB — LIPASE: Lipase: 54 U/L (ref 11.0–59.0)

## 2017-12-27 MED ORDER — TRAMADOL HCL 50 MG PO TABS: ORAL_TABLET | ORAL | 0 refills | Status: DC

## 2017-12-27 MED ORDER — SUCRALFATE 1 GM/10ML PO SUSP: ORAL | 3 refills | Status: DC

## 2017-12-27 NOTE — Progress Notes (Signed)
I agree with the above note, plan 

## 2017-12-27 NOTE — Patient Instructions (Addendum)
Bland Diet A bland diet consists of foods that do not have a lot of fat or fiber. Foods without fat or fiber are easier for the body to digest. They are also less likely to irritate your mouth, throat, stomach, and other parts of your gastrointestinal tract. A bland diet is sometimes called a BRAT diet. What is my plan? Your health care provider or dietitian may recommend specific changes to your diet to prevent and treat your symptoms, such as:  Eating small meals often.  Cooking food until it is soft enough to chew easily.  Chewing your food well.  Drinking fluids slowly.  Not eating foods that are very spicy, sour, or fatty.  Not eating citrus fruits, such as oranges and grapefruit.  What do I need to know about this diet?  Eat a variety of foods from the bland diet food list.  Do not follow a bland diet longer than you have to.  Ask your health care provider whether you should take vitamins. What foods can I eat? Grains  Hot cereals, such as cream of wheat. Bread, crackers, or tortillas made from refined white flour. Rice. Vegetables Canned or cooked vegetables. Mashed or boiled potatoes. Fruits Bananas. Applesauce. Other types of cooked or canned fruit with the skin and seeds removed, such as canned peaches or pears. Meats and Other Protein Sources Scrambled eggs. Creamy peanut butter or other nut butters. Lean, well-cooked meats, such as chicken or fish. Tofu. Soups or broths. Dairy Low-fat dairy products, such as milk, cottage cheese, or yogurt. Beverages Water. Herbal tea. Apple juice. Sweets and Desserts Pudding. Custard. Fruit gelatin. Ice cream. Fats and Oils Mild salad dressings. Canola or olive oil. The items listed above may not be a complete list of allowed foods or beverages. Contact your dietitian for more options. What foods are not recommended? Foods and ingredients that are often not recommended include:  Spicy foods, such as hot sauce or  salsa.  Fried foods.  Sour foods, such as pickled or fermented foods.  Raw vegetables or fruits, especially citrus or berries.  Caffeinated drinks.  Alcohol.  Strongly flavored seasonings or condiments.  The items listed above may not be a complete list of foods and beverages that are not allowed. Contact your dietitian for more information. This information is not intended to replace advice given to you by your health care provider. Make sure you discuss any questions you have with your health care provider. Document Released: 08/16/2015 Document Revised: 09/30/2015 Document Reviewed: 05/06/2014 Elsevier Interactive Patient Education  2018 [place] provider has requested that you go to the basement level for lab work before leaving today. Press "B" on the elevator. The lab is located at the first door on the left as you exit the elevator. We sent prescriptions to your pharmacy, [facility] [address]. 1. Ultram ( Tramadol) 50 mg 2. Carafate 1 gm  Continue Pantoprazole sodium 40 mg twice daily. Eat a bland diet for now.  You have been scheduled for an abdominal ultrasound at [facility] (1st floor of hospital) on Tuesday 01-01-2018 at 11;30 am. Please arrive at 11:15 am to your appointment for registration. Make certain not to have anything to eat or drink 6 hours prior to your appointment. Should you need to reschedule your appointment, please contact radiology at [phone]. This test typically takes about 30 minutes to perform.   If you are age 61 or younger, your body mass index should be between 19-25. Your  Body mass index is 31.14 kg/m. If this is out of the aformentioned range listed, please consider follow up with your Primary Care Provider.

## 2017-12-27 NOTE — Progress Notes (Signed)
Subjective:    Patient ID: Lori [name], female    DOB: [birth year], 62 y.o.   MRN: [id number]  HPI Lori [name] is a pleasant 62 year old white female, known to Dr. Audelia Acton last seen in our office in the fall 2018 who comes in today with complaints of acute upper abdominal pain primarily right upper quadrant over the past 1 month. Describes this as constant unrelenting burning in nature, worse postprandially and associated with nausea without vomiting.  She does have chronic GERD, is on twice daily PPI therapy chronically and has no complaints of heartburn indigestion or dysphasia.  Bowel movements have been fairly normal she does tend towards constipation for which she uses prune juice.  She will occasionally see a small amount of blood if she strains. She had previously been using a lot of NSAIDs per prior notes but says she has not had any aspirin or NSAIDs over the past year.  She denies any EtOH use, no known injury. Patient did have an episode of acute pancreatitis in summer 2018, etiology not clear and CT scan was unrevealing as well as ultrasound in October 2018. Had ER visit on 11/27/2017 for her recurrent pain had CT of the abdomen and pelvis with contrast which was negative.  Labs were done with lipase of 48 normal LFTs, WBC of 12.2 and hemoglobin of 12.4. EGD and colonoscopy were done in October 2018, EGD was normal and she had removal of a 4 mm polyp at the time of colonoscopy which was a tubular adenoma.  Path from EGD was positive for H. pylori and she was treated with a course of Pylera.  Patient says she is very uncomfortable with her current pain and is anxious for an answer.  Review of Systems Pertinent positive and negative review of systems were noted in the above HPI section.  All other review of systems was otherwise negative.  Outpatient Encounter Medications as of 12/27/2017  Medication Sig  . Colchicine 0.6 MG CAPS Take 1 capsule by mouth daily as needed. (Patient taking  differently: Take 1 capsule by mouth daily as needed (gout pain). )  . diphenhydramine-acetaminophen (TYLENOL PM) 25-500 MG TABS tablet Take 2 tablets by mouth at bedtime as needed (for sleep).  . gabapentin (NEURONTIN) 300 MG capsule TAKE 1 CAPSULE BY MOUTH EVERYDAY AT BEDTIME  . lisinopril (PRINIVIL,ZESTRIL) 20 MG tablet TAKE 1 TABLET (20 MG TOTAL) BY MOUTH DAILY.  Marland Kitchen NICOTINE STEP 2 14 MG/24HR patch Place 1 patch onto the skin daily as needed (smoking cessation).   . ondansetron (ZOFRAN) 4 MG tablet Take 1 tablet (4 mg total) by mouth every 6 (six) hours as needed for nausea.  Marland Kitchen QUEtiapine (SEROQUEL) 25 MG tablet Take 25 mg by mouth at bedtime.   . sucralfate (CARAFATE) 1 g tablet Take 1 tablet (1 g total) by mouth 4 (four) times daily -  with meals and at bedtime.  . traZODone (DESYREL) 100 MG tablet Take 50 mg by mouth at bedtime as needed for sleep.   Marland Kitchen zolpidem (AMBIEN) 5 MG tablet Take 5 mg by mouth at bedtime as needed for sleep.   . pantoprazole (PROTONIX) 40 MG tablet Take 1 tablet (40 mg total) by mouth 2 (two) times daily. (Patient taking differently: Take 40 mg by mouth daily. )  . sucralfate (CARAFATE) 1 GM/10ML suspension Take 1 gram ( 10 ml) between meals and at bedtime.  . traMADol (ULTRAM) 50 MG tablet Take 1 tablet by mouth every 6 hours as needed  for pain.   No facility-administered encounter medications on file as of 12/27/2017.    No Known Allergies Patient Active Problem List   Diagnosis Date Noted  . Hx of adenomatous colonic polyps   . Benign neoplasm of transverse colon   . Bloating   . Gastritis and gastroduodenitis   . Bilateral foot pain 01/15/2017  . 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 Normocytic anemia 09/12/2016  . Hyponatremia 09/12/2016  . Acute pancreatitis 09/11/2016  . Shin splints, sequela 08/16/2016  . Cellulitis of groin 01/05/2016  . Encounter for screening mammogram for breast cancer 07/02/2015  . Neuropathy of both feet  07/02/2015  . Allergic rhinitis 11/27/2014  . Otitis media of right ear 07/01/2012  . Cough 11/20/2011  . Renal lesion 06/12/2011  . Nausea 05/10/2011  . Caries 04/12/2011  . Abdominal pain 10/25/2010  . Preventive measure 10/25/2010  . Shoulder pain, left 10/25/2010  . Leg pain 06/20/2010  . CERVICAL CANCER 10/23/2008  . TOBACCO ABUSE 10/23/2008  . Depression with anxiety 10/23/2008  . Essential hypertension 10/23/2008  . GERD 10/23/2008  . Insomnia 10/23/2008  . SNORING 10/23/2008   Social History   Socioeconomic History  . Marital status: Single    Spouse name: Not on file  . Number of children: 2  . Years of education: Not on file  . Highest education level: Not on file  Occupational History  . Not on file  Social Needs  . Financial resource strain: Not on file  . Food insecurity:    Worry: Not on file    Inability: Not on file  . Transportation needs:    Medical: Not on file    Non-medical: Not on file  Tobacco Use  . Smoking status: Light Tobacco Smoker    Packs/day: 0.50    Years: 43.00    Pack years: 21.50    Last attempt to quit: 09/05/2016    Years since quitting: 1.3  . Smokeless tobacco: Never Used  . Tobacco comment: patches have helped in the past  Substance and Sexual Activity  . Alcohol use: No  . Drug use: No  . Sexual activity: Not Currently    Partners: Male  Lifestyle  . Physical activity:    Days per week: Not on file    Minutes per session: Not on file  . Stress: Not on file  Relationships  . Social connections:    Talks on phone: Not on file    Gets together: Not on file    Attends religious service: Not on file    Active member of club or organization: Not on file    Attends meetings of clubs or organizations: Not on file    Relationship status: Not on file  . Intimate partner violence:    Fear of current or ex partner: Not on file    Emotionally abused: Not on file    Physically abused: Not on file    Forced sexual activity:  Not on file  Other Topics Concern  . Not on file  Social History Narrative   Patient had multiple death in her family over a short period of time. father, brother, uncle and niece who was stabbed 3 years ago. Patient's husband had a car accident last year and  need currently a feeding tube.           Ms. Toman family history includes Colon cancer in her maternal uncle; Colon polyps in her brother; Diabetes in her brother and father; Heart  disease in her father; Hyperlipidemia in her brother and father; Hypertension in her brother and father; Mental illness in her daughter; Obesity in her daughter; Thyroid disease in her daughter and mother.      Objective:    Vitals:   12/27/17 1333  BP: (!) 98/52  Pulse: 96    Physical Exam; well-developed white female in no acute distress, pleasant blood pressure 98/52 pulse 96, height 5 foot 2, weight 173, BMI of 31.1.  HEENT; nontraumatic normocephalic EOMI PERRLA sclera anicteric buccal mucosa moist, Cardiovascular; regular rate and rhythm with S1-S2 no murmur rub or gallop, Pulmonary; clear bilaterally, Abdomen ;obese, soft she is tender in the epigastrium and right upper quadrant there is no true guarding or rebound no palpable mass or hepatosplenomegaly, no costal margin tenderness, no rash or skin lesion noted, Bowel sounds are present.  Rectal ;exam not done, Extremities ;no clubbing cyanosis or edema skin warm and dry, cap neuro psych alert and oriented, grossly nonfocal mood and affect appropriate       Assessment & Plan:   #48 62 year old female with 1 month history of recurrent right upper quadrant abdominal pain which has been constant, burning in nature associated with nausea and worse postprandially. Patient is on chronic twice daily PPI therapy for GERD. Prior upper abdominal ultrasound October 2018 unrevealing CT of the abdomen and pelvis 11/27/2017- labs unremarkable with exception of mild leukocytosis.  Etiology of her pain is  not clear, by exam I do not think this is costochondritis, no skin lesions evident. Rule out biliary dyskinesia, rule out gallbladder disease, rule out gastritis  #2 hypertension 3.  History of adenomatous colon polyps 4.  History of neuropathy peripheral 5.  Prior history of acute pancreatitis of unclear etiology  Plan; schedule for repeat upper abdominal ultrasound, if this is negative we will proceed to [facility] HIDA scan Continue Protonix 40 mg p.o. twice daily Add Carafate 1 g between meals and at bedtime Ultram 50 mg every 6 hours PRN for pain Will repeat labs today to include CBC, sed rate, CMET, and lipase. Patient has not had confirmatory testing with H. pylori stool antigen to prove eradication but I do not think she will do well off of PPI therapy with acute symptoms currently  Amy Genia Harold PA-C 12/27/2017   Cc: Alvester Chou, NP

## 2018-01-01 ENCOUNTER — Other Ambulatory Visit: Payer: Medicaid Other

## 2018-01-01 ENCOUNTER — Ambulatory Visit (HOSPITAL_COMMUNITY): Payer: Medicaid Other

## 2018-01-08 ENCOUNTER — Ambulatory Visit (HOSPITAL_COMMUNITY)
Admission: RE | Admit: 2018-01-08 | Discharge: 2018-01-08 | Disposition: A | Discharge status: Home / Self Care | Payer: Medicaid Other | Source: Ambulatory Visit | Attending: Physician Assistant | Admitting: Physician Assistant

## 2018-01-08 DIAGNOSIS — R1011 Right upper quadrant pain: Secondary | ICD-10-CM | POA: Insufficient documentation

## 2018-01-08 DIAGNOSIS — N281 Cyst of kidney, acquired: Secondary | ICD-10-CM | POA: Diagnosis not present

## 2018-01-08 DIAGNOSIS — R110 Nausea: Secondary | ICD-10-CM

## 2018-01-10 ENCOUNTER — Other Ambulatory Visit: Payer: Self-pay

## 2018-01-10 DIAGNOSIS — R1011 Right upper quadrant pain: Secondary | ICD-10-CM

## 2018-01-22 ENCOUNTER — Encounter (HOSPITAL_COMMUNITY): Admission: RE | Admit: 2018-01-22 | Payer: Medicaid Other | Source: Ambulatory Visit

## 2018-02-28 ENCOUNTER — Inpatient Hospital Stay: Admission: RE | Admit: 2018-02-28 | Payer: Medicaid Other | Source: Ambulatory Visit

## 2018-04-02 ENCOUNTER — Other Ambulatory Visit: Payer: Self-pay | Admitting: Adult Health

## 2018-04-02 DIAGNOSIS — N6310 Unspecified lump in the right breast, unspecified quadrant: Secondary | ICD-10-CM

## 2018-04-15 ENCOUNTER — Ambulatory Visit
Admission: RE | Admit: 2018-04-15 | Discharge: 2018-04-15 | Disposition: A | Discharge status: Home / Self Care | Payer: Medicaid Other | Source: Ambulatory Visit | Attending: Adult Health | Admitting: Adult Health

## 2018-04-15 DIAGNOSIS — N6310 Unspecified lump in the right breast, unspecified quadrant: Secondary | ICD-10-CM

## 2018-07-25 ENCOUNTER — Other Ambulatory Visit (INDEPENDENT_AMBULATORY_CARE_PROVIDER_SITE_OTHER): Payer: Self-pay | Admitting: Orthopedic Surgery

## 2018-07-25 DIAGNOSIS — G5793 Unspecified mononeuropathy of bilateral lower limbs: Secondary | ICD-10-CM

## 2018-07-29 ENCOUNTER — Other Ambulatory Visit (INDEPENDENT_AMBULATORY_CARE_PROVIDER_SITE_OTHER): Payer: Self-pay | Admitting: Orthopedic Surgery

## 2018-07-29 DIAGNOSIS — G5793 Unspecified mononeuropathy of bilateral lower limbs: Secondary | ICD-10-CM

## 2018-12-17 ENCOUNTER — Other Ambulatory Visit: Payer: Self-pay | Admitting: Adult Health

## 2018-12-17 DIAGNOSIS — M549 Dorsalgia, unspecified: Secondary | ICD-10-CM

## 2019-02-23 IMAGING — CR DG ABDOMEN 1V
1 series · 2 of 2 positions shown · non-contrast
Comparison: CT abdomen and pelvis 09/11/2016

CLINICAL DATA: LEFT lower quadrant abdominal pain since recent
incontinence, nausea, vomiting, history hypertension, cervical
cancer, smoker

EXAM:
ABDOMEN - 1 VIEW

[Series 1: t abdomen supine · 0.14mm/px · 2 of 2 slices shown]
[im 1/2]
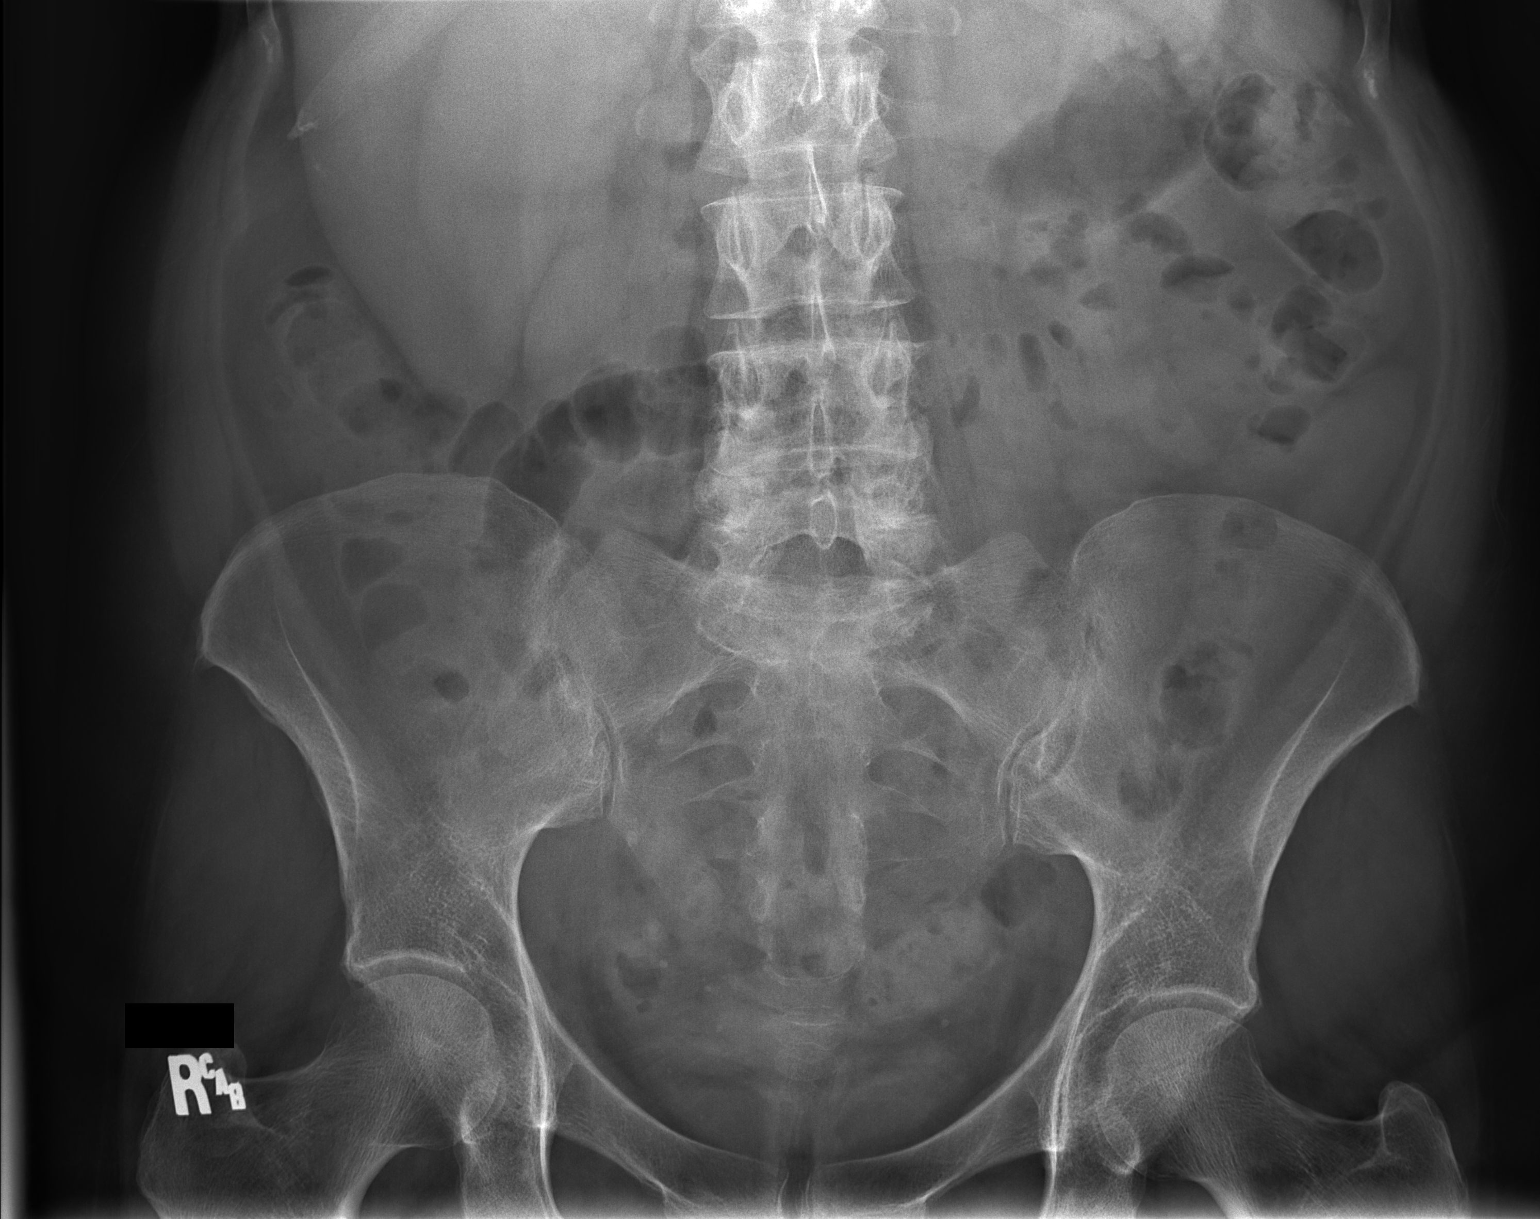
[im 2/2]
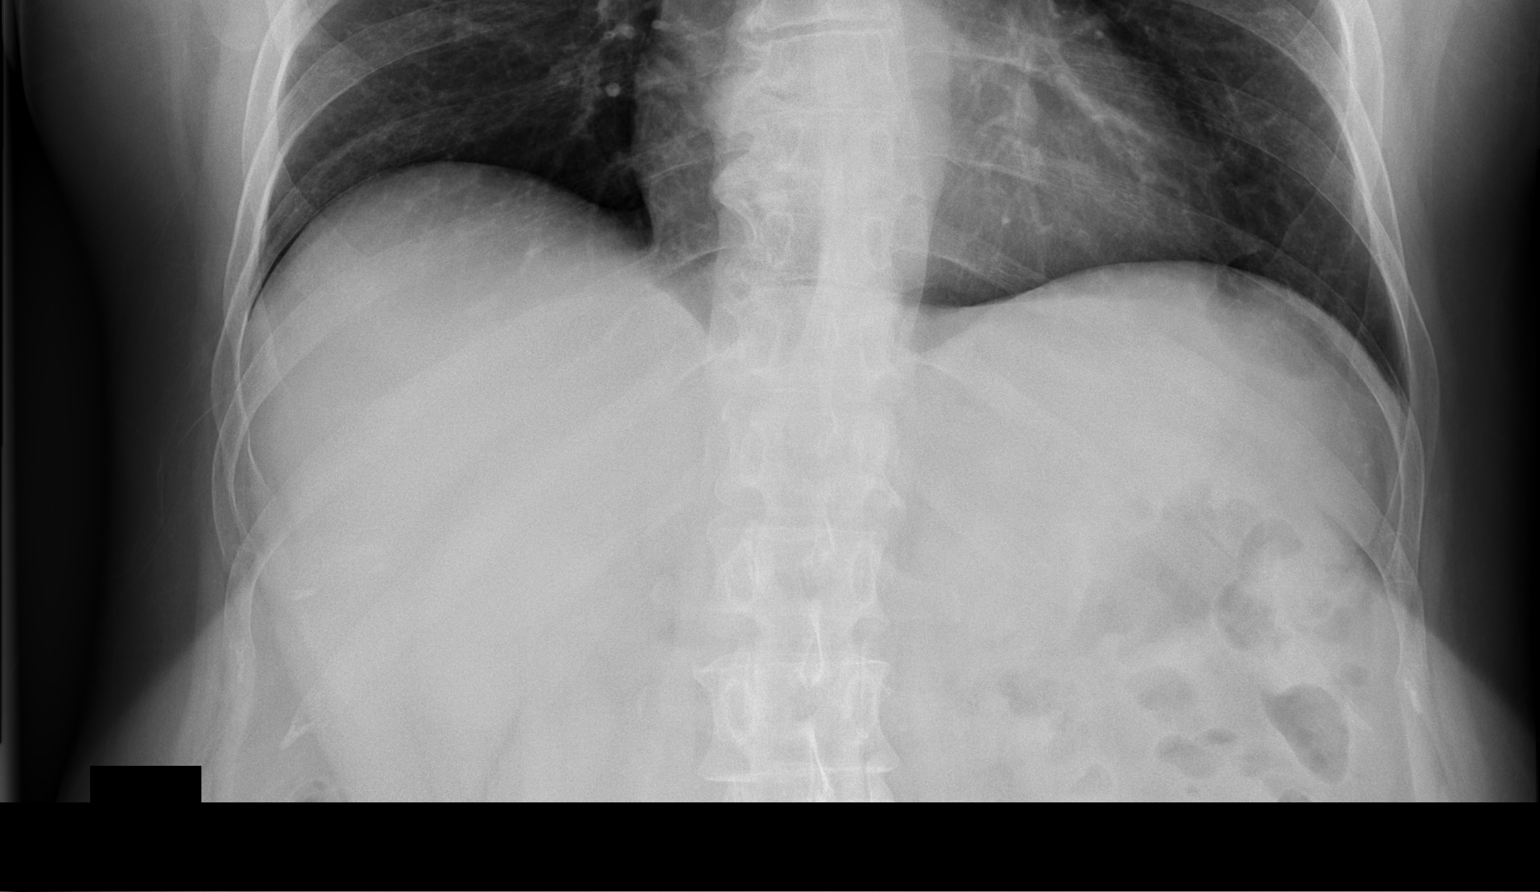

[2 of 2 positions shown; findings below may reference images not displayed]

FINDINGS: Lung bases clear.

Nonobstructive bowel gas pattern.

No bowel dilatation or bowel wall thickening.

Bones demineralized.

No urinary tract calcifications.
IMPRESSION: Normal exam.

## 2019-02-27 ENCOUNTER — Other Ambulatory Visit: Payer: Self-pay

## 2019-02-27 DIAGNOSIS — Z20822 Contact with and (suspected) exposure to covid-19: Secondary | ICD-10-CM

## 2019-03-01 LAB — NOVEL CORONAVIRUS, NAA: SARS-CoV-2, NAA: NOT DETECTED

## 2019-11-03 ENCOUNTER — Other Ambulatory Visit: Payer: Self-pay

## 2019-11-03 ENCOUNTER — Ambulatory Visit (HOSPITAL_COMMUNITY)
Admission: EM | Admit: 2019-11-03 | Discharge: 2019-11-03 | Disposition: A | Discharge status: Home / Self Care | Payer: Medicaid Other | Attending: Internal Medicine | Admitting: Internal Medicine

## 2019-11-03 ENCOUNTER — Encounter (HOSPITAL_COMMUNITY): Payer: Self-pay

## 2019-11-03 DIAGNOSIS — M940 Chondrocostal junction syndrome [Tietze]: Secondary | ICD-10-CM | POA: Diagnosis not present

## 2019-11-03 DIAGNOSIS — R0602 Shortness of breath: Secondary | ICD-10-CM

## 2019-11-03 DIAGNOSIS — R079 Chest pain, unspecified: Secondary | ICD-10-CM

## 2019-11-03 MED ORDER — ALBUTEROL SULFATE HFA 108 (90 BASE) MCG/ACT IN AERS: 1.0000 | INHALATION_SPRAY | Freq: Four times a day (QID) | RESPIRATORY_TRACT | 0 refills | Status: AC | PRN

## 2019-11-03 MED ORDER — ACETAMINOPHEN 325 MG PO TABS: 650.0000 mg | ORAL_TABLET | Freq: Four times a day (QID) | ORAL | 0 refills | Status: AC | PRN

## 2019-11-03 MED ORDER — DICLOFENAC SODIUM 1 % EX GEL: 4.0000 g | Freq: Four times a day (QID) | CUTANEOUS | 0 refills | Status: DC

## 2019-11-03 NOTE — Discharge Instructions (Signed)
Use the inhaler as needed for wheezing  We do not believe this is your heart  Take 2 regular tylenol every 6 hours Use the gel over the areas of pain up to 4 times daily  Call your PCP today or first thing tomorrow for follow up ASAP  If severely worsening symptoms, you should report to the Emergency Department

## 2019-11-03 NOTE — ED Provider Notes (Signed)
We have [place]    CSN: [id number] Arrival date & time: 11/03/19  1358      History   Chief Complaint Chief Complaint  Patient presents with  . Chest Pain  . Shortness of Breath    HPI Lori [name] is a 64 y.o. female.   Patient presents with right sided chest and lower rib pain. She reports pain has been intermittent for 1 week following a fall. She reports falling forward onto her knees and front. She did not hit her head or face. She reports having pain in the right breast and right side of her chest when this occurred. She reports pain got better for a few days but then returned over the last 3 days. She reports having a cough leading up to the last 3 days that she thinks may have caused some of the pain to worsen. Cough has improved but pain has persisted. Pain does not radiate to jaw or left side of chest. No nausea, vomiting or sweating when it occurs. She reports intermittent pangs of pain which bring her to tears. She reports certain movements cause this pain. She reports it starts below her right breast and moves up and around to the right side of her chest. She reports this takes her breath away at times. She reports if she is still and not moving, pain is not present. She reports pain with pushing around the areas of pain.      Past Medical History:  Diagnosis Date  . Arthritis   . Depression   . Depression with anxiety August 09, 2008   Patient had multiple death in her family over a short period of time. father, brother, uncle and niece who was stabbed 3 years ago. Patient's husband had a car accident last year and  need currently a feeding tube.   Marland Kitchen GERD (gastroesophageal reflux disease)   . Gout   . Headache(784.0)    migraines  . History of cervical cancer  1985    status post partial hysterectomy , last Pap smear 10 years ago , no further followup  . History of cocaine abuse ([place]) 10/23/2008    last documented in 2005/08/09 when admitted for hypertensive  urgency  . Hypertension 09-Aug-2005   Admitted for HTN crisis in 10/2008. Was given in August 09, 2008 a wrong prescription from the pharmacy and per ED note it was Lisinopril-Hydrocholorthizide which gave her a  hives and feeling of sickness. Patient was started  on this meds on 05/31/2010 by Dr  Ihor Gully without any problem.   . Insomnia   . Tobacco abuse     35 years  . Uterine cancer [facility])     Patient Active Problem List   Diagnosis Date Noted  . Hx of adenomatous colonic polyps   . Benign neoplasm of transverse colon   . Bloating   . Gastritis and gastroduodenitis   . Bilateral foot pain 01/15/2017  . 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 Normocytic anemia 09/12/2016  . Hyponatremia 09/12/2016  . Acute pancreatitis 09/11/2016  . Shin splints, sequela 08/16/2016  . Cellulitis of groin 01/05/2016  . Encounter for screening mammogram for breast cancer 07/02/2015  . 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 Allergic rhinitis 11/27/2014  . Otitis media of right ear 07/01/2012  . Cough 11/20/2011  . Renal lesion 06/12/2011  . Nausea 05/10/2011  . Caries 04/12/2011  . Abdominal pain 10/25/2010  . Preventive measure 10/25/2010  . Shoulder pain, left 10/25/2010  .  Leg pain 06/20/2010  . CERVICAL CANCER 10/23/2008  . TOBACCO ABUSE 10/23/2008  . Depression with anxiety 10/23/2008  . Essential hypertension 10/23/2008  . GERD 10/23/2008  . Insomnia 10/23/2008  . SNORING 10/23/2008    Past Surgical History:  Procedure Laterality Date  . CESAREAN SECTION  1985  . COLONOSCOPY WITH PROPOFOL N/A 03/01/2017   Procedure: COLONOSCOPY WITH PROPOFOL;  Surgeon: Milus Banister, MD;  Location: [facility] ENDOSCOPY;  Service: Endoscopy;  Laterality: N/A;  . ESOPHAGOGASTRODUODENOSCOPY (EGD) WITH PROPOFOL N/A 03/01/2017   Procedure: ESOPHAGOGASTRODUODENOSCOPY (EGD) WITH PROPOFOL;  Surgeon: Milus Banister, MD;  Location: [facility] ENDOSCOPY;  Service: Endoscopy;  Laterality: N/A;  . PARTIAL HYSTERECTOMY      . SKIN GRAFT      OB History   No obstetric history on file.      Home Medications    Prior to Admission medications   Medication Sig Start Date End Date Taking? Authorizing Provider  gabapentin (NEURONTIN) 300 MG capsule TAKE 1 CAPSULE BY MOUTH EVERYDAY AT BEDTIME 11/09/17  Yes Newt Minion, MD  lisinopril (PRINIVIL,ZESTRIL) 20 MG tablet TAKE 1 TABLET (20 MG TOTAL) BY MOUTH DAILY. 07/07/16  Yes Burgess Estelle, MD  QUEtiapine (SEROQUEL) 25 MG tablet Take 25 mg by mouth at bedtime.  12/15/16  Yes [provider]  traZODone (DESYREL) 100 MG tablet Take 50 mg by mouth at bedtime as needed for sleep.  12/15/16  Yes [provider]  zolpidem (AMBIEN) 5 MG tablet Take 5 mg by mouth at bedtime as needed for sleep.  12/06/16  Yes [provider]  acetaminophen (TYLENOL) 325 MG tablet Take 2 tablets (650 mg total) by mouth every 6 (six) hours as needed. 11/03/19   Chantia Amalfitano, Marguerita Beards, PA-C  albuterol (VENTOLIN HFA) 108 (90 Base) MCG/ACT inhaler Inhale 1-2 puffs into the lungs every 6 (six) hours as needed for wheezing or shortness of breath. 11/03/19   Manreet Kiernan, Marguerita Beards, PA-C  Colchicine 0.6 MG CAPS Take 1 capsule by mouth daily as needed. Patient taking differently: Take 1 capsule by mouth daily as needed (gout pain).  05/30/17   Suzan Slick, NP  diclofenac Sodium (VOLTAREN) 1 % GEL Apply 4 g topically 4 (four) times daily. 11/03/19   Fallyn Munnerlyn, Marguerita Beards, PA-C  diphenhydramine-acetaminophen (TYLENOL PM) 25-500 MG TABS tablet Take 2 tablets by mouth at bedtime as needed (for sleep).    [provider]  NICOTINE STEP 2 14 MG/24HR patch Place 1 patch onto the skin daily as needed (smoking cessation).  01/11/17   [provider]  ondansetron (ZOFRAN) 4 MG tablet Take 1 tablet (4 mg total) by mouth every 6 (six) hours as needed for nausea. 09/12/16   Janece Canterbury, MD  pantoprazole (PROTONIX) 40 MG tablet Take 1 tablet (40 mg total) by mouth 2 (two) times daily. Patient taking  differently: Take 40 mg by mouth daily.  03/05/17 11/27/17  Milus Banister, MD  sucralfate (CARAFATE) 1 g tablet Take 1 tablet (1 g total) by mouth 4 (four) times daily -  with meals and at bedtime. 11/27/17   Domenic Moras, PA-C  sucralfate (CARAFATE) 1 GM/10ML suspension Take 1 gram ( 10 ml) between meals and at bedtime. 12/27/17   Esterwood, Amy S, PA-C  traMADol (ULTRAM) 50 MG tablet Take 1 tablet by mouth every 6 hours as needed for pain. 12/27/17   Esterwood, Amy S, PA-C    Family History Family History  Problem Relation Age of Onset  . Thyroid  disease Mother   . Diabetes Father   . Heart disease Father   . Hyperlipidemia Father   . Hypertension Father   . Diabetes Brother   . Hypertension Brother   . Hyperlipidemia Brother   . Colon polyps Brother   . Thyroid disease Daughter   . Obesity Daughter   . Mental illness Daughter   . Colon cancer Maternal Uncle   . Stomach cancer Neg Hx   . Pancreatic cancer Neg Hx     Social History Social History   Tobacco Use  . Smoking status: Light Tobacco Smoker    Packs/day: 0.50    Years: 43.00    Pack years: 21.50    Last attempt to quit: 09/05/2016    Years since quitting: 3.1  . Smokeless tobacco: Never Used  . Tobacco comment: patches have helped in the past  Vaping Use  . Vaping Use: Never used  Substance Use Topics  . Alcohol use: No  . Drug use: No     Allergies   Patient has no known allergies.   Review of Systems Review of Systems   Physical Exam Triage Vital Signs ED Triage Vitals  Enc Vitals Group     BP 11/03/19 1409 (!) 154/75     Pulse Rate 11/03/19 1409 (!) 102     Resp 11/03/19 1409 (!) 24     Temp 11/03/19 1409 98.4 F (36.9 C)     Temp Source 11/03/19 1409 Oral     SpO2 11/03/19 1409 100 %     Weight --      Height --      Head Circumference --      Peak Flow --      Pain Score 11/03/19 1410 6     Pain Loc --      Pain Edu? --      Excl. in [place]? --    No data found.  Updated Vital  Signs BP (!) 154/75 (BP Location: Right Arm)   Pulse (!) 102   Temp 98.4 F (36.9 C) (Oral)   Resp (!) 24   SpO2 100%   Visual Acuity Right Eye Distance:   Left Eye Distance:   Bilateral Distance:    Right Eye Near:   Left Eye Near:    Bilateral Near:     Physical Exam Vitals and nursing note reviewed.  Constitutional:      General: She is not in acute distress.    Appearance: She is well-developed. She is not ill-appearing.  HENT:     Head: Normocephalic and atraumatic.  Eyes:     Conjunctiva/sclera: Conjunctivae normal.  Cardiovascular:     Rate and Rhythm: Normal rate and regular rhythm.     Heart sounds: Normal heart sounds. No murmur heard.   Pulmonary:     Effort: Pulmonary effort is normal. No accessory muscle usage or respiratory distress.     Breath sounds: Normal breath sounds. No decreased breath sounds, wheezing, rhonchi or rales.  Chest:     Chest wall: Tenderness (Under right breast line, up through right lateral aspect of the sternum across right mid breast tissue and chest wall.) present. No deformity or edema.  Abdominal:     Palpations: Abdomen is soft.     Tenderness: There is no abdominal tenderness.  Musculoskeletal:     Cervical back: Neck supple.     Right lower leg: No edema.     Left lower leg: No edema.  Skin:  General: Skin is warm and dry.     Findings: No rash.  Neurological:     Mental Status: She is alert.      UC Treatments / Results  Labs (all labs ordered are listed, but only abnormal results are displayed) Labs Reviewed - No data to display  EKG Normal sinus rhythm with nonspecific ST abnormalities, without major changes since last.  -EKG discussed interpreted with supervising physician Dr. Lanny Cramp Radiology No results found.  Procedures Procedures (including critical care time)  Medications Ordered in UC Medications - No data to display  Initial Impression / Assessment and Plan / UC Course  I have reviewed the  triage vital signs and the nursing notes.  Pertinent labs & imaging results that were available during my care of the patient were reviewed by me and considered in my medical decision making (see chart for details).     #Costochondritis Patient is a 64 year old patient with history of anxiety and chronic pain presenting with what appears to be most consistent with a costochondritis.  Given history of trauma and cough, distribution of pain, reproducibility on exam, most likely costochondritis.  Low risk based on heart score, especially given reassuring EKG and reproducibility of pain, lower suspicion for cardiac at this time.  Patient is on several pain medications, has a history of GERD and is currently on anti-inflammatory medication for arthritis, so management options are limited.  Recommend addition of ibuprofen and topical diclofenac.  Also sent albuterol inhaler for any future wheezing or coughing fits for possible aid.  Highly suggest that she follow-up closely with her primary care.  Strict emergency department precautions were discussed.  Patient verbalized understanding plan of care. -Patient case was discussed and patient examined with supervising physician Dr. Lanny Cramp Final Clinical Impressions(s) / UC Diagnoses   Final diagnoses:  Costochondritis     Discharge Instructions     Use the inhaler as needed for wheezing  We do not believe this is your heart  Take 2 regular tylenol every 6 hours Use the gel over the areas of pain up to 4 times daily  Call your PCP today or first thing tomorrow for follow up ASAP  If severely worsening symptoms, you should report to the Emergency Department      ED Prescriptions    Medication Sig Dispense Auth. Provider   albuterol (VENTOLIN HFA) 108 (90 Base) MCG/ACT inhaler Inhale 1-2 puffs into the lungs every 6 (six) hours as needed for wheezing or shortness of breath. 8 g Marji Kuehnel, Marguerita Beards, PA-C   diclofenac Sodium (VOLTAREN) 1 % GEL Apply  4 g topically 4 (four) times daily. 100 g Pailynn Vahey, Marguerita Beards, PA-C   acetaminophen (TYLENOL) 325 MG tablet Take 2 tablets (650 mg total) by mouth every 6 (six) hours as needed. 30 tablet Paeton Studer, Marguerita Beards, PA-C     PDMP not reviewed this encounter.   Purnell Shoemaker, PA-C 11/04/19 0022

## 2019-11-03 NOTE — ED Triage Notes (Signed)
Pt c/o central CP radiating to right arm for approx 1 week and describes as sharp/burning. Pt states she fell forward onto her abdomen/chest approx 1 week and CP began at that time with SOB. Pt states CP increases when moving, bending over. Also c/o nausea, dizziness  Denies recent drug or ETOH use.  EKG performed and results given to Dr. Lanny Cramp who approved for pt to return to waiting room and be seen according to time of arrival.

## 2019-11-24 ENCOUNTER — Other Ambulatory Visit: Payer: Self-pay | Admitting: Neurosurgery

## 2019-11-24 DIAGNOSIS — M4316 Spondylolisthesis, lumbar region: Secondary | ICD-10-CM

## 2019-12-09 ENCOUNTER — Inpatient Hospital Stay: Admission: RE | Admit: 2019-12-09 | Payer: Medicaid Other | Source: Ambulatory Visit

## 2020-01-05 ENCOUNTER — Other Ambulatory Visit: Payer: Self-pay | Admitting: Adult Health

## 2020-01-05 DIAGNOSIS — N644 Mastodynia: Secondary | ICD-10-CM

## 2020-01-20 ENCOUNTER — Other Ambulatory Visit: Payer: Self-pay | Admitting: Adult Health

## 2020-01-20 DIAGNOSIS — N644 Mastodynia: Secondary | ICD-10-CM

## 2020-02-03 ENCOUNTER — Ambulatory Visit
Admission: RE | Admit: 2020-02-03 | Discharge: 2020-02-03 | Disposition: A | Discharge status: Home / Self Care | Payer: Medicaid Other | Source: Ambulatory Visit | Attending: *Deleted | Admitting: *Deleted

## 2020-02-03 ENCOUNTER — Other Ambulatory Visit: Payer: Self-pay

## 2020-02-03 ENCOUNTER — Ambulatory Visit: Payer: Medicaid Other

## 2020-02-03 DIAGNOSIS — N644 Mastodynia: Secondary | ICD-10-CM

## 2020-02-09 ENCOUNTER — Other Ambulatory Visit: Payer: Medicaid Other

## 2020-02-19 ENCOUNTER — Other Ambulatory Visit: Payer: Medicaid Other

## 2020-02-26 ENCOUNTER — Ambulatory Visit (INDEPENDENT_AMBULATORY_CARE_PROVIDER_SITE_OTHER)
Admission: EM | Admit: 2020-02-26 | Discharge: 2020-02-26 | Disposition: A | Discharge status: Other Acute Inpatient Hospital | Payer: Medicaid Other | Source: Home / Self Care

## 2020-02-26 ENCOUNTER — Emergency Department (HOSPITAL_COMMUNITY): Payer: Medicaid Other

## 2020-02-26 ENCOUNTER — Inpatient Hospital Stay (HOSPITAL_COMMUNITY)
Admission: EM | Admit: 2020-02-26 | Discharge: 2020-03-01 | DRG: 377 | Disposition: A | Discharge status: Home / Self Care | Payer: Medicaid Other | Attending: Internal Medicine | Admitting: Internal Medicine

## 2020-02-26 ENCOUNTER — Encounter (HOSPITAL_COMMUNITY): Payer: Self-pay | Admitting: Emergency Medicine

## 2020-02-26 ENCOUNTER — Other Ambulatory Visit: Payer: Self-pay

## 2020-02-26 DIAGNOSIS — F17210 Nicotine dependence, cigarettes, uncomplicated: Secondary | ICD-10-CM | POA: Diagnosis present

## 2020-02-26 DIAGNOSIS — M549 Dorsalgia, unspecified: Secondary | ICD-10-CM | POA: Insufficient documentation

## 2020-02-26 DIAGNOSIS — K920 Hematemesis: Secondary | ICD-10-CM | POA: Diagnosis not present

## 2020-02-26 DIAGNOSIS — G43909 Migraine, unspecified, not intractable, without status migrainosus: Secondary | ICD-10-CM | POA: Diagnosis not present

## 2020-02-26 DIAGNOSIS — K0889 Other specified disorders of teeth and supporting structures: Secondary | ICD-10-CM | POA: Diagnosis present

## 2020-02-26 DIAGNOSIS — D539 Nutritional anemia, unspecified: Secondary | ICD-10-CM | POA: Diagnosis present

## 2020-02-26 DIAGNOSIS — K2970 Gastritis, unspecified, without bleeding: Secondary | ICD-10-CM | POA: Diagnosis not present

## 2020-02-26 DIAGNOSIS — Z833 Family history of diabetes mellitus: Secondary | ICD-10-CM

## 2020-02-26 DIAGNOSIS — Z20822 Contact with and (suspected) exposure to covid-19: Secondary | ICD-10-CM | POA: Diagnosis present

## 2020-02-26 DIAGNOSIS — Z83438 Family history of other disorder of lipoprotein metabolism and other lipidemia: Secondary | ICD-10-CM

## 2020-02-26 DIAGNOSIS — Z8541 Personal history of malignant neoplasm of cervix uteri: Secondary | ICD-10-CM | POA: Diagnosis not present

## 2020-02-26 DIAGNOSIS — E669 Obesity, unspecified: Secondary | ICD-10-CM | POA: Diagnosis present

## 2020-02-26 DIAGNOSIS — R1013 Epigastric pain: Secondary | ICD-10-CM

## 2020-02-26 DIAGNOSIS — Z8249 Family history of ischemic heart disease and other diseases of the circulatory system: Secondary | ICD-10-CM

## 2020-02-26 DIAGNOSIS — K2900 Acute gastritis without bleeding: Principal | ICD-10-CM | POA: Diagnosis present

## 2020-02-26 DIAGNOSIS — R1011 Right upper quadrant pain: Secondary | ICD-10-CM

## 2020-02-26 DIAGNOSIS — E870 Hyperosmolality and hypernatremia: Secondary | ICD-10-CM | POA: Diagnosis not present

## 2020-02-26 DIAGNOSIS — Z79899 Other long term (current) drug therapy: Secondary | ICD-10-CM

## 2020-02-26 DIAGNOSIS — R112 Nausea with vomiting, unspecified: Secondary | ICD-10-CM | POA: Insufficient documentation

## 2020-02-26 DIAGNOSIS — F32A Depression, unspecified: Secondary | ICD-10-CM | POA: Diagnosis present

## 2020-02-26 DIAGNOSIS — Z8542 Personal history of malignant neoplasm of other parts of uterus: Secondary | ICD-10-CM | POA: Diagnosis not present

## 2020-02-26 DIAGNOSIS — K2901 Acute gastritis with bleeding: Principal | ICD-10-CM | POA: Diagnosis present

## 2020-02-26 DIAGNOSIS — I959 Hypotension, unspecified: Secondary | ICD-10-CM | POA: Insufficient documentation

## 2020-02-26 DIAGNOSIS — R1110 Vomiting, unspecified: Secondary | ICD-10-CM

## 2020-02-26 DIAGNOSIS — K219 Gastro-esophageal reflux disease without esophagitis: Secondary | ICD-10-CM | POA: Diagnosis not present

## 2020-02-26 DIAGNOSIS — Z23 Encounter for immunization: Secondary | ICD-10-CM | POA: Diagnosis not present

## 2020-02-26 DIAGNOSIS — N179 Acute kidney failure, unspecified: Secondary | ICD-10-CM | POA: Diagnosis not present

## 2020-02-26 DIAGNOSIS — F419 Anxiety disorder, unspecified: Secondary | ICD-10-CM | POA: Diagnosis present

## 2020-02-26 DIAGNOSIS — I10 Essential (primary) hypertension: Secondary | ICD-10-CM | POA: Diagnosis present

## 2020-02-26 DIAGNOSIS — F39 Unspecified mood [affective] disorder: Secondary | ICD-10-CM | POA: Diagnosis not present

## 2020-02-26 DIAGNOSIS — R579 Shock, unspecified: Secondary | ICD-10-CM | POA: Diagnosis not present

## 2020-02-26 DIAGNOSIS — Z6834 Body mass index (BMI) 34.0-34.9, adult: Secondary | ICD-10-CM | POA: Diagnosis not present

## 2020-02-26 DIAGNOSIS — Z8371 Family history of colonic polyps: Secondary | ICD-10-CM

## 2020-02-26 DIAGNOSIS — E872 Acidosis: Secondary | ICD-10-CM | POA: Diagnosis present

## 2020-02-26 DIAGNOSIS — K56609 Unspecified intestinal obstruction, unspecified as to partial versus complete obstruction: Secondary | ICD-10-CM | POA: Diagnosis present

## 2020-02-26 DIAGNOSIS — E871 Hypo-osmolality and hyponatremia: Secondary | ICD-10-CM | POA: Diagnosis not present

## 2020-02-26 DIAGNOSIS — D509 Iron deficiency anemia, unspecified: Secondary | ICD-10-CM | POA: Diagnosis not present

## 2020-02-26 DIAGNOSIS — R1084 Generalized abdominal pain: Secondary | ICD-10-CM

## 2020-02-26 DIAGNOSIS — R571 Hypovolemic shock: Secondary | ICD-10-CM | POA: Diagnosis present

## 2020-02-26 DIAGNOSIS — M109 Gout, unspecified: Secondary | ICD-10-CM | POA: Diagnosis present

## 2020-02-26 DIAGNOSIS — Z8349 Family history of other endocrine, nutritional and metabolic diseases: Secondary | ICD-10-CM | POA: Diagnosis not present

## 2020-02-26 DIAGNOSIS — E860 Dehydration: Secondary | ICD-10-CM | POA: Diagnosis present

## 2020-02-26 DIAGNOSIS — R109 Unspecified abdominal pain: Secondary | ICD-10-CM | POA: Diagnosis not present

## 2020-02-26 DIAGNOSIS — M1990 Unspecified osteoarthritis, unspecified site: Secondary | ICD-10-CM | POA: Diagnosis present

## 2020-02-26 DIAGNOSIS — Z800 Family history of malignant neoplasm of digestive organs: Secondary | ICD-10-CM

## 2020-02-26 DIAGNOSIS — K2990 Gastroduodenitis, unspecified, without bleeding: Secondary | ICD-10-CM | POA: Diagnosis not present

## 2020-02-26 DIAGNOSIS — F0630 Mood disorder due to known physiological condition, unspecified: Secondary | ICD-10-CM | POA: Diagnosis not present

## 2020-02-26 LAB — I-STAT CHEM 8, ED
BUN: 15 mg/dL (ref 8–23)
BUN: 33 mg/dL — ABNORMAL HIGH (ref 8–23)
Calcium, Ion: 0.74 mmol/L — CL (ref 1.15–1.40)
Calcium, Ion: 1.2 mmol/L (ref 1.15–1.40)
Chloride: 118 mmol/L — ABNORMAL HIGH (ref 98–111)
Chloride: 99 mmol/L (ref 98–111)
Creatinine, Ser: 0.9 mg/dL (ref 0.44–1.00)
Creatinine, Ser: 2.5 mg/dL — ABNORMAL HIGH (ref 0.44–1.00)
Glucose, Bld: 136 mg/dL — ABNORMAL HIGH (ref 70–99)
Glucose, Bld: 67 mg/dL — ABNORMAL LOW (ref 70–99)
HCT: 17 % — ABNORMAL LOW (ref 36.0–46.0)
HCT: 36 % (ref 36.0–46.0)
Hemoglobin: 12.2 g/dL (ref 12.0–15.0)
Hemoglobin: 5.8 g/dL — CL (ref 12.0–15.0)
Potassium: 2.1 mmol/L — CL (ref 3.5–5.1)
Potassium: 4.4 mmol/L (ref 3.5–5.1)
Sodium: 133 mmol/L — ABNORMAL LOW (ref 135–145)
Sodium: 146 mmol/L — ABNORMAL HIGH (ref 135–145)
TCO2: 10 mmol/L — ABNORMAL LOW (ref 22–32)
TCO2: 21 mmol/L — ABNORMAL LOW (ref 22–32)

## 2020-02-26 LAB — COMPREHENSIVE METABOLIC PANEL
ALT: 13 U/L (ref 0–44)
ALT: 12 U/L (ref 0–44)
AST: 21 U/L (ref 15–41)
AST: 21 U/L (ref 15–41)
Albumin: 4.2 g/dL (ref 3.5–5.0)
Albumin: 3.6 g/dL (ref 3.5–5.0)
Alkaline Phosphatase: 81 U/L (ref 38–126)
Alkaline Phosphatase: 65 U/L (ref 38–126)
Anion gap: 15 (ref 5–15)
Anion gap: 12 (ref 5–15)
BUN: 31 mg/dL — ABNORMAL HIGH (ref 8–23)
BUN: 31 mg/dL — ABNORMAL HIGH (ref 8–23)
CO2: 20 mmol/L — ABNORMAL LOW (ref 22–32)
CO2: 19 mmol/L — ABNORMAL LOW (ref 22–32)
Calcium: 9.9 mg/dL (ref 8.9–10.3)
Calcium: 8.5 mg/dL — ABNORMAL LOW (ref 8.9–10.3)
Chloride: 98 mmol/L (ref 98–111)
Chloride: 104 mmol/L (ref 98–111)
Creatinine, Ser: 2.53 mg/dL — ABNORMAL HIGH (ref 0.44–1.00)
Creatinine, Ser: 1.84 mg/dL — ABNORMAL HIGH (ref 0.44–1.00)
GFR, Estimated: 21 mL/min — ABNORMAL LOW (ref >60)
GFR, Estimated: 30 mL/min — ABNORMAL LOW (ref >60)
Glucose, Bld: 142 mg/dL — ABNORMAL HIGH (ref 70–99)
Glucose, Bld: 149 mg/dL — ABNORMAL HIGH (ref 70–99)
Potassium: 4.3 mmol/L (ref 3.5–5.1)
Potassium: 4.3 mmol/L (ref 3.5–5.1)
Sodium: 133 mmol/L — ABNORMAL LOW (ref 135–145)
Sodium: 135 mmol/L (ref 135–145)
Total Bilirubin: 0.7 mg/dL (ref 0.3–1.2)
Total Bilirubin: 0.6 mg/dL (ref 0.3–1.2)
Total Protein: 7.1 g/dL (ref 6.5–8.1)
Total Protein: 6.1 g/dL — ABNORMAL LOW (ref 6.5–8.1)

## 2020-02-26 LAB — AMYLASE: Amylase: 79 U/L (ref 28–100)

## 2020-02-26 LAB — CBC WITH DIFFERENTIAL/PLATELET
Abs Immature Granulocytes: 0.02 10*3/uL (ref 0.00–0.07)
Basophils Absolute: 0 10*3/uL (ref 0.0–0.1)
Basophils Relative: 0 %
Eosinophils Absolute: 0 10*3/uL (ref 0.0–0.5)
Eosinophils Relative: 0 %
HCT: 35.9 % — ABNORMAL LOW (ref 36.0–46.0)
Hemoglobin: 11.6 g/dL — ABNORMAL LOW (ref 12.0–15.0)
Immature Granulocytes: 0 %
Lymphocytes Relative: 24 %
Lymphs Abs: 2.4 10*3/uL (ref 0.7–4.0)
MCH: 32.4 pg (ref 26.0–34.0)
MCHC: 32.3 g/dL (ref 30.0–36.0)
MCV: 100.3 fL — ABNORMAL HIGH (ref 80.0–100.0)
Monocytes Absolute: 0.4 10*3/uL (ref 0.1–1.0)
Monocytes Relative: 4 %
Neutro Abs: 7.2 10*3/uL (ref 1.7–7.7)
Neutrophils Relative %: 72 %
Platelets: 320 10*3/uL (ref 150–400)
RBC: 3.58 MIL/uL — ABNORMAL LOW (ref 3.87–5.11)
RDW: 12.4 % (ref 11.5–15.5)
WBC: 10.2 10*3/uL (ref 4.0–10.5)
nRBC: 0 % (ref 0.0–0.2)

## 2020-02-26 LAB — CBC
HCT: 32.6 % — ABNORMAL LOW (ref 36.0–46.0)
Hemoglobin: 10.6 g/dL — ABNORMAL LOW (ref 12.0–15.0)
MCH: 32.3 pg (ref 26.0–34.0)
MCHC: 32.5 g/dL (ref 30.0–36.0)
MCV: 99.4 fL (ref 80.0–100.0)
Platelets: 292 10*3/uL (ref 150–400)
RBC: 3.28 MIL/uL — ABNORMAL LOW (ref 3.87–5.11)
RDW: 12.4 % (ref 11.5–15.5)
WBC: 10.7 10*3/uL — ABNORMAL HIGH (ref 4.0–10.5)
nRBC: 0 % (ref 0.0–0.2)

## 2020-02-26 LAB — RESPIRATORY PANEL BY RT PCR (FLU A&B, COVID)
Influenza A by PCR: NEGATIVE
Influenza B by PCR: NEGATIVE
SARS Coronavirus 2 by RT PCR: NEGATIVE

## 2020-02-26 LAB — POCT URINALYSIS DIPSTICK, ED / UC
Bilirubin Urine: NEGATIVE
Glucose, UA: NEGATIVE mg/dL
Ketones, ur: NEGATIVE mg/dL
Nitrite: NEGATIVE
Protein, ur: NEGATIVE mg/dL
Specific Gravity, Urine: 1.01 (ref 1.005–1.030)
Urobilinogen, UA: 0.2 mg/dL (ref 0.0–1.0)
pH: 6 (ref 5.0–8.0)

## 2020-02-26 LAB — TROPONIN I (HIGH SENSITIVITY)
Troponin I (High Sensitivity): 16 ng/L (ref <18)
Troponin I (High Sensitivity): 18 ng/L — ABNORMAL HIGH (ref <18)

## 2020-02-26 LAB — HIV ANTIBODY (ROUTINE TESTING W REFLEX): HIV Screen 4th Generation wRfx: NONREACTIVE

## 2020-02-26 LAB — LACTIC ACID, PLASMA
Lactic Acid, Venous: 2.1 mmol/L (ref 0.5–1.9)
Lactic Acid, Venous: 1.4 mmol/L (ref 0.5–1.9)

## 2020-02-26 LAB — CBG MONITORING, ED: Glucose-Capillary: 143 mg/dL — ABNORMAL HIGH (ref 70–99)

## 2020-02-26 LAB — LIPASE, BLOOD: Lipase: 36 U/L (ref 11–51)

## 2020-02-26 LAB — TYPE AND SCREEN
ABO/RH(D): O POS
Antibody Screen: NEGATIVE

## 2020-02-26 LAB — ABO/RH: ABO/RH(D): O POS

## 2020-02-26 LAB — PROTIME-INR
INR: 1 (ref 0.8–1.2)
Prothrombin Time: 12.8 seconds (ref 11.4–15.2)

## 2020-02-26 MED ORDER — LORAZEPAM 2 MG/ML IJ SOLN
1.0000 mg | Freq: Once | INTRAMUSCULAR | Status: AC
  Administered 2020-02-26: 1 mg via INTRAVENOUS
  Filled 2020-02-26: qty 1

## 2020-02-26 MED ORDER — SODIUM CHLORIDE 0.9 % IV SOLN: 2.0000 g | INTRAVENOUS | Status: DC

## 2020-02-26 MED ORDER — FENTANYL CITRATE (PF) 100 MCG/2ML IJ SOLN
50.0000 ug | Freq: Once | INTRAMUSCULAR | Status: AC
  Administered 2020-02-26: 50 ug via INTRAVENOUS
  Filled 2020-02-26: qty 2

## 2020-02-26 MED ORDER — HEPARIN SODIUM (PORCINE) 5000 UNIT/ML IJ SOLN
5000.0000 [IU] | Freq: Three times a day (TID) | INTRAMUSCULAR | Status: DC
  Administered 2020-02-26 – 2020-02-27 (×2): 5000 [IU] via SUBCUTANEOUS
  Filled 2020-02-26 (×2): qty 1

## 2020-02-26 MED ORDER — SODIUM CHLORIDE 0.9 % IV SOLN: Freq: Once | INTRAVENOUS | Status: AC

## 2020-02-26 MED ORDER — SODIUM CHLORIDE 0.9 % IV BOLUS
1000.0000 mL | Freq: Once | INTRAVENOUS | Status: AC
  Administered 2020-02-26: 1000 mL via INTRAVENOUS

## 2020-02-26 MED ORDER — LACTATED RINGERS IV BOLUS: 1000.0000 mL | Freq: Once | INTRAVENOUS | Status: DC

## 2020-02-26 MED ORDER — ONDANSETRON HCL 4 MG/2ML IJ SOLN
2.0000 mg | Freq: Once | INTRAMUSCULAR | Status: AC
  Administered 2020-02-26: 2 mg via INTRAMUSCULAR

## 2020-02-26 MED ORDER — LACTATED RINGERS IV SOLN: INTRAVENOUS | Status: AC

## 2020-02-26 MED ORDER — METRONIDAZOLE IN NACL 5-0.79 MG/ML-% IV SOLN
500.0000 mg | Freq: Once | INTRAVENOUS | Status: AC
  Administered 2020-02-26: 500 mg via INTRAVENOUS
  Filled 2020-02-26: qty 100

## 2020-02-26 MED ORDER — ONDANSETRON HCL 4 MG/2ML IJ SOLN
INTRAMUSCULAR | Status: AC
  Filled 2020-02-26: qty 2

## 2020-02-26 MED ORDER — NOREPINEPHRINE 4 MG/250ML-% IV SOLN
0.0000 ug/min | INTRAVENOUS | Status: DC
  Administered 2020-02-26: 2 ug/min via INTRAVENOUS
  Filled 2020-02-26: qty 250

## 2020-02-26 MED ORDER — LACTATED RINGERS IV SOLN: INTRAVENOUS | Status: DC

## 2020-02-26 MED ORDER — SODIUM CHLORIDE 0.9 % IV SOLN
2.0000 g | Freq: Once | INTRAVENOUS | Status: AC
  Administered 2020-02-26: 2 g via INTRAVENOUS
  Filled 2020-02-26: qty 2

## 2020-02-26 MED ORDER — ONDANSETRON HCL 4 MG/2ML IJ SOLN
4.0000 mg | Freq: Four times a day (QID) | INTRAMUSCULAR | Status: DC | PRN
  Administered 2020-02-26 – 2020-02-28 (×3): 4 mg via INTRAVENOUS
  Filled 2020-02-26 (×3): qty 2

## 2020-02-26 MED ORDER — PANTOPRAZOLE SODIUM 40 MG IV SOLR
40.0000 mg | Freq: Two times a day (BID) | INTRAVENOUS | Status: DC
  Administered 2020-02-26 – 2020-02-29 (×6): 40 mg via INTRAVENOUS
  Filled 2020-02-26 (×6): qty 40

## 2020-02-26 MED ORDER — SODIUM CHLORIDE 0.9 % IV SOLN: 2.0000 g | Freq: Two times a day (BID) | INTRAVENOUS | Status: DC

## 2020-02-26 MED ORDER — LORAZEPAM 2 MG/ML IJ SOLN
0.5000 mg | Freq: Once | INTRAMUSCULAR | Status: AC | PRN
  Administered 2020-02-26: 0.5 mg via INTRAVENOUS
  Filled 2020-02-26: qty 1

## 2020-02-26 NOTE — H&P (Signed)
Date: 02/26/2020               Patient Name:  Lori [name] MRN: [id number]  DOB: [birth year] Age / Sex: 64 y.o., female   PCP: Alvester Chou, NP         Medical Service: Internal Medicine Teaching Service         Attending Physician: Dr. Rebeca Alert Raynaldo Opitz, MD    First Contact: Dr. Candie Chroman Pager: [phone]  Second Contact: Dr. Court Joy Pager: [phone]       After Hours (After 5p/  First Contact Pager: [phone]  weekends / holidays): Second Contact Pager: [phone]   Chief Complaint: Hematemesis and abdominal pain  History of Present Illness:  Lori [name] is a 64 yo with PMH of GERD, HTN, Depression, Arthritis, cocaine abuse who presents with about two weeks of nausea and poor po intake with vomiting and epigastric abdominal pain radiating to the back that developed today. Patient reports that she has had intermittent abdominal pain over the past 3 months. Her PCP discontinued her arthritis medications 3 months ago due to concern for " burning a whole in my stomach." Patient reports that she has had decreased appetite as she will often become "sick" after 2-3 bites of food. Yesterday morning patient's pain became significantly worse and she began having hematemesis.Patient went to urgent care. Patient reported last having a stool yesterday. Upon arrival patient was found to have sever hypotension and sent to ED via EMS.  ED course: Patient was in acute distress upon presentation. CT scan was concerning for possible bowel obstruction. General surgery and PCCM were consulted. PCCM noted patient was in shock with hypotension, mottled and in renal failure. Patient received 3L bolus of fluids and levophed. Patient was started on cefepime and flagyl. MAP >65 and PCCM felt patient was stable for floor. IMTS was consulted for admission.  Meds:  Current Meds  Medication Sig  . acetaminophen (TYLENOL) 500 MG tablet Take 1,000-2,000 mg by mouth every 6 (six) hours as needed for headache.    . albuterol (VENTOLIN HFA) 108 (90 Base) MCG/ACT inhaler Inhale 1-2 puffs into the lungs every 6 (six) hours as needed for wheezing or shortness of breath.  . ALPRAZolam (XANAX) 0.5 MG tablet Take 0.5 mg by mouth in the morning, at noon, in the evening, and at bedtime.  . diphenhydramine-acetaminophen (TYLENOL PM) 25-500 MG TABS tablet Take 2-4 tablets by mouth at bedtime as needed (headaches).   . gabapentin (NEURONTIN) 300 MG capsule TAKE 1 CAPSULE BY MOUTH EVERYDAY AT BEDTIME (Patient taking differently: Take 300 mg by mouth 3 (three) times daily. )  . lisinopril (PRINIVIL,ZESTRIL) 20 MG tablet TAKE 1 TABLET (20 MG TOTAL) BY MOUTH DAILY.  Marland Kitchen QUEtiapine (SEROQUEL) 25 MG tablet Take 25 mg by mouth at bedtime.   Marland Kitchen zolpidem (AMBIEN) 5 MG tablet Take 5 mg by mouth at bedtime.      Allergies: Allergies as of 02/26/2020  . (No Known Allergies)   Past Medical History:  Diagnosis Date  . Arthritis   . Depression   . Depression with anxiety 08/23/08   Patient had multiple death in her family over a short period of time. father, brother, uncle and niece who was stabbed 3 years ago. Patient's husband had a car accident last year and  need currently a feeding tube.   Marland Kitchen GERD (gastroesophageal reflux disease)   . Gout   . Headache(784.0)    migraines  . History of cervical cancer  1985    status post partial hysterectomy , last Pap smear 10 years ago , no further followup  . History of cocaine abuse ([place]) 10/23/2008    last documented in 2007 when admitted for hypertensive urgency  . Hypertension 2007   Admitted for HTN crisis in 10/2008. Was given in 2010 a wrong prescription from the pharmacy and per ED note it was Lisinopril-Hydrocholorthizide which gave her a  hives and feeling of sickness. Patient was started  on this meds on 05/31/2010 by Dr  Ihor Gully without any problem.   . Insomnia   . Tobacco abuse     35 years  . Uterine cancer ([place])     Family History:  Family History  Problem  Relation Age of Onset  . Thyroid disease Mother   . Diabetes Father   . Heart disease Father   . Hyperlipidemia Father   . Hypertension Father   . Diabetes Brother   . Hypertension Brother   . Hyperlipidemia Brother   . Colon polyps Brother   . Thyroid disease Daughter   . Obesity Daughter   . Mental illness Daughter   . Colon cancer Maternal Uncle   . Stomach cancer Neg Hx   . Pancreatic cancer Neg Hx     Social History:  Social History   Socioeconomic History  . Marital status: Single    Spouse name: Not on file  . Number of children: 2  . Years of education: Not on file  . Highest education level: Not on file  Occupational History  . Not on file  Tobacco Use  . Smoking status: Light Tobacco Smoker    Packs/day: 0.50    Years: 43.00    Pack years: 21.50    Last attempt to quit: 09/05/2016    Years since quitting: 3.4  . Smokeless tobacco: Never Used  . Tobacco comment: patches have helped in the past  Vaping Use  . Vaping Use: Never used  Substance and Sexual Activity  . Alcohol use: No  . Drug use: No  . Sexual activity: Not Currently    Partners: Male  Other Topics Concern  . Not on file  Social History Narrative   Patient had multiple death in her family over a short period of time. father, brother, uncle and niece who was stabbed 3 years ago. Patient's husband had a car accident last year and  need currently a feeding tube.          Social Determinants of Health   Financial Resource Strain:   . Difficulty of Paying Living Expenses: Not on file  Food Insecurity:   . Worried About Charity fundraiser in the Last Year: Not on file  . Ran Out of Food in the Last Year: Not on file  Transportation Needs:   . Lack of Transportation (Medical): Not on file  . Lack of Transportation (Non-Medical): Not on file  Physical Activity:   . Days of Exercise per Week: Not on file  . Minutes of Exercise per Session: Not on file  Stress:   . Feeling of Stress : Not on  file  Social Connections:   . Frequency of Communication with Friends and Family: Not on file  . Frequency of Social Gatherings with Friends and Family: Not on file  . Attends Religious Services: Not on file  . Active Member of Clubs or Organizations: Not on file  . Attends Archivist Meetings: Not on file  . Marital Status:  Not on file  Intimate Partner Violence:   . Fear of Current or Ex-Partner: Not on file  . Emotionally Abused: Not on file  . Physically Abused: Not on file  . Sexually Abused: Not on file    Review of Systems: A complete ROS was negative except as per HPI.   Physical Exam: Blood pressure 125/78, pulse (!) 111, resp. rate (!) 23, height 5\' 2"  (1.575 m), weight 84.4 kg, SpO2 98 %. Physical Exam HENT:     Head: Normocephalic and atraumatic.  Eyes:     Extraocular Movements: Extraocular movements intact.  Cardiovascular:     Rate and Rhythm: Regular rhythm. Tachycardia present.  Pulmonary:     Effort: Pulmonary effort is normal.     Breath sounds: Normal breath sounds.  Abdominal:     General: Bowel sounds are decreased.     Palpations: Abdomen is soft. There is no shifting dullness.     Tenderness: There is abdominal tenderness in the right lower quadrant and epigastric area. There is no rebound. Negative signs include Rovsing's sign.     Comments: Distended abdomen.  Patient vomiting bright red blood on exam.  Musculoskeletal:        General: Tenderness present.     Comments: Thoracic bilateral paraspinal pain around T6  Skin:    General: Skin is warm and dry.  Neurological:     Mental Status: She is alert.  Psychiatric:        Mood and Affect: Affect is tearful.        Behavior: Behavior is cooperative.     Comments: Patient is teary eyed. Patient is very apprehensive. She has had 2 failed attempted NG tube placement and is very distressed at the thought of a third attempt.     EKG: personally reviewed my interpretation is sinus  tachycardia  CXR: personally reviewed my interpretation is no cardiomegaly, cannot visualize gastric bubble. Minimal bibasilar atelectasis or scarring. R lung lobes have hyperdense bronchioles.  CT Abdomen Pelvis Wo Contrast  Result Date: 02/26/2020 CLINICAL DATA:  Abdominal pain. EXAM: CT ABDOMEN AND PELVIS WITHOUT CONTRAST TECHNIQUE: Multidetector CT imaging of the abdomen and pelvis was performed following the standard protocol without IV contrast. COMPARISON:  November 27, 2017 FINDINGS: Lower chest: No acute abnormality. Hepatobiliary: No focal liver abnormality is seen. No gallstones, gallbladder wall thickening, or biliary dilatation. Pancreas: Unremarkable. No pancreatic ductal dilatation or surrounding inflammatory changes. Spleen: Normal in size without focal abnormality. Adrenals/Urinary Tract: Adrenal glands are unremarkable. Kidneys are normal in size, without renal calculi or hydronephrosis. Stable, adjacent, 2.1 cm x 2.0 cm and 1.5 cm x 1.2 cm exophytic left renal cysts are seen. Bladder is unremarkable. Stomach/Bowel: There is a small hiatal hernia. Appendix appears normal. Mildly dilated small bowel loops are seen within the lower abdomen (maximum small bowel diameter of approximately 3.0 cm). A clear transition zone is not identified. Noninflamed diverticula are seen within the sigmoid colon. Vascular/Lymphatic: There is marked severity calcification of the common iliac arteries, without evidence of aneurysmal dilatation. No enlarged abdominal or pelvic lymph nodes. Reproductive: Status post hysterectomy. No adnexal masses. Other: No abdominal wall hernia or abnormality. No abdominopelvic ascites. Musculoskeletal: Degenerative changes seen within the lumbar spine, most prominent at the level of L5-S1. IMPRESSION: 1. Findings consistent with a mild ileus versus distal partial small bowel obstruction. 2. Sigmoid diverticulosis. 3. Small hiatal hernia. 4. Stable left renal cysts. 5. Aortic  atherosclerosis. Aortic Atherosclerosis (ICD10-I70.0). Electronically Signed   By: Hoover Browns  Houston M.D.   On: 02/26/2020 15:30   DG Chest Port 1 View  Result Date: 02/26/2020 CLINICAL DATA:  Shortness of breath. EXAM: PORTABLE CHEST 1 VIEW COMPARISON:  November 27, 2017. FINDINGS: The heart size and mediastinal contours are within normal limits. No pneumothorax or pleural effusion is noted. Minimal bibasilar subsegmental atelectasis or scarring is noted. The visualized skeletal structures are unremarkable. IMPRESSION: Minimal bibasilar subsegmental atelectasis or scarring. Aortic Atherosclerosis (ICD10-I70.0). Electronically Signed   By: Marijo Conception M.D.   On: 02/26/2020 15:18   US Abdomen Limited RUQ (LIVER/GB)  Result Date: 02/26/2020 CLINICAL DATA:  Right upper quadrant pain EXAM: ULTRASOUND ABDOMEN LIMITED RIGHT UPPER QUADRANT COMPARISON:  CT 02/26/2020, ultrasound 01/08/2018 FINDINGS: Gallbladder: No gallstones or wall thickening visualized. No sonographic Murphy sign noted by sonographer. Common bile duct: Diameter: 3 mm Liver: No focal lesion identified. Within normal limits in parenchymal echogenicity. Portal vein is patent on color Doppler imaging with normal direction of blood flow towards the liver. Other: None. IMPRESSION: Negative right upper quadrant abdominal ultrasound Electronically Signed   By: Donavan Foil M.D.   On: 02/26/2020 17:47   Assessment & Plan by Problem: Active Problems:   SBO (small bowel obstruction) (HCC)  Ileus vs SBO Patient reports hematemesis and difficulty eating. Patient continues to have emesis, CT abdomen noted mild ileus vs SBO. General surgery does not feel surgery is necessary at this time. At this time patient has not had NG tube placed. WBC 10.2. RUQ [place] was negative for cholecystistis and cholelithiasis. Patient was worked up for pancreatitis in the ED.  Amylase 79. Lipase 36.  - Admit to Progressive, IMTS, Dr. Rebeca Alert - Attempt NG tube placement  with Ativan 0.5mg  once on board - mIVF LR @ 159ml/h -NPO - AM CBC -Surgery consulted recc's appreciated  Abdominal pain associated w/ Hematemesis Patient continues to vomit bright red blood.  Patient hx is significant for recently stopping medication due to concern for ulceration of the stomach.  Concern for gastric ulcer vs trauma from NG tube. Patient has sucralfate in her home medication list. Hgb 12.2>11.6. Patient also had 2 failed NG tube placements prior to [place] seeing her. Do to patient's anxiety patient may have misunderstood when asked about period hematemesis. If the blood is in fact new may be due to trauma from NG tube hitting the back of throat. PT/INR 12.8/1.0. Decreased concern for referred pain due to ACS. EKG is sinus tachy and Trp 16>18.  - IV Protonix 40mg  BID - NPO - Consider GI consult in AM  Hypotensive shock- resolved Patient presented with signs of hypotensive shock. Patient received Levophed and 3L fluid resuscitation and responded well. Patient BP at time of admission by IMTS 112/53. PCCM has Bcx in the case that sepsis contributed to shock. Patient LA 2.1>1.4 after fluids.  - Continue cefepime and flagyl -f/u Bcx  Mood disorder Patient has depression and takes Seroquel 25 QHS, Ambien 5mg   and Xanax 0.5mg  QID. Home medication list suggest patient has significant anxiety disorder.  - Will restart when patient is not NPO  HTN Patient has hx of hypertension. Patient presented hypotensive and required levophed at presentation to ED. Home medication is lisinopril 20mg  - Will hold at this time.  AKI Patient presented with renal failure in the setting of hypovolemic shock. Baseline Cr appears to be around 1.0. Cr 2.5>2.53. BUN 33>31. Patient has hypernatremia post bolus fluids. - continue mIVF LR @ 128ml/h -AM BMP   Dispo: Admit patient to Inpatient  with expected length of stay greater than 2 midnights.  Signed: Freida Busman, MD 02/26/2020, 6:40 PM  Pager:  [phone] After 5pm on weekdays and 1pm on weekends: On Call pager: [phone]

## 2020-02-26 NOTE — ED Notes (Signed)
Report called to CN at [facility] ED °

## 2020-02-26 NOTE — Progress Notes (Signed)
Blood cultures documented drawn @ 1630 before antibiotics given

## 2020-02-26 NOTE — Consult Note (Addendum)
[facility] Surgery Consult Note  Lori [name] 04/16/1956  [id number].    Requesting MD: Quintella Reichert, MD Chief Complaint/Reason for Consult: possible distal SBO, shock   HPI:  [name] is a 64 y/o F with a PMH HTN, GERD, cervical cancer Aug 05, 1983 s/p hysterectomy, and tobacco abuse who presented to the ED from urgent care with a cc abdominal pain, back pain, and vomiting. Patient reports that around 11am this morning she had acute onset of severe bilateral flank pain.  She reports shortly after she began having nausea with associated emesis.  She reports greater than 10 episodes of bright green emesis since onset.  No hematemesis.  She reports her pain began radiating across her right upper quadrant and epigastrium and was burning sensation. Assc subjective fevers, and chills. She presented to urgent care for evaluation.  Patient was brought to ED due to hypotension, severity of symptoms, and inability to participate during exam per notes.  Reports over the last 2 weeks she has been having generalized fatigue, nausea and poor oral intake. Denies a known history of cholelithiasis or gallblader disease. Hx of pancreatitis 09/2016 that she states feels like similar pain. Denies PMH SBO. Last flatus and BM was yesterday. She is not on any blood thinners. She has hx of prior c-section and hysterectomy.    On chart review she has had a prior upper endoscopy 02/2017 that showed mild gastritis and was recommended to avoid NSAIDs and continue PPI. Also had a colonoscopy by Dr. Ardis Hughs 02/2017 that was negative besides 4 mm polyp in the transverse colon.   ROS: Review of Systems  Constitutional: Positive for chills, fever and malaise/fatigue.  Respiratory: Negative for cough and shortness of breath.   Cardiovascular: Negative for chest pain.  Gastrointestinal: Positive for abdominal pain, constipation, nausea and vomiting. Negative for diarrhea.  Musculoskeletal: Positive for back pain.   Skin: Negative for rash.  All other systems reviewed and are negative.   Family History  Problem Relation Age of Onset  . Thyroid disease Mother   . Diabetes Father   . Heart disease Father   . Hyperlipidemia Father   . Hypertension Father   . Diabetes Brother   . Hypertension Brother   . Hyperlipidemia Brother   . Colon polyps Brother   . Thyroid disease Daughter   . Obesity Daughter   . Mental illness Daughter   . Colon cancer Maternal Uncle   . Stomach cancer Neg Hx   . Pancreatic cancer Neg Hx     Past Medical History:  Diagnosis Date  . Arthritis   . Depression   . Depression with anxiety 2008/08/04   Patient had multiple death in her family over a short period of time. father, brother, uncle and niece who was stabbed 3 years ago. Patient's husband had a car accident last year and  need currently a feeding tube.   Marland Kitchen GERD (gastroesophageal reflux disease)   . Gout   . Headache(784.0)    migraines  . History of cervical cancer  1985    status post partial hysterectomy , last Pap smear 10 years ago , no further followup  . History of cocaine abuse ([place]) 10/23/2008    last documented in 08-04-2005 when admitted for hypertensive urgency  . Hypertension 04-Aug-2005   Admitted for HTN crisis in 10/2008. Was given in 08/04/2008 a wrong prescription from the pharmacy and per ED note it was Lisinopril-Hydrocholorthizide which gave her a  hives and feeling of sickness. Patient  was started  on this meds on 05/31/2010 by Dr  Ihor Gully without any problem.   . Insomnia   . Tobacco abuse     35 years  . Uterine cancer [facility])     Past Surgical History:  Procedure Laterality Date  . CESAREAN SECTION  1985  . COLONOSCOPY WITH PROPOFOL N/A 03/01/2017   Procedure: COLONOSCOPY WITH PROPOFOL;  Surgeon: Milus Banister, MD;  Location: [facility] ENDOSCOPY;  Service: Endoscopy;  Laterality: N/A;  . ESOPHAGOGASTRODUODENOSCOPY (EGD) WITH PROPOFOL N/A 03/01/2017   Procedure: ESOPHAGOGASTRODUODENOSCOPY (EGD) WITH  PROPOFOL;  Surgeon: Milus Banister, MD;  Location: [facility] ENDOSCOPY;  Service: Endoscopy;  Laterality: N/A;  . PARTIAL HYSTERECTOMY    . SKIN GRAFT      Social History:  reports that she has been smoking. She has a 21.50 pack-year smoking history. She has never used smokeless tobacco. She reports that she does not drink alcohol and does not use drugs.  Allergies: No Known Allergies  (Not in a hospital admission)   Blood pressure (!) 71/60, resp. rate (!) 23, SpO2 100 %. Physical Exam: General: pleasant, WD/WN white female who is laying in bed and appears uncomfortable HEENT: head is normocephalic, atraumatic.  Sclera are noninjected.  PERRL.  Ears and nose without any masses or lesions.  Mouth is pink and moist. Dentition fair Heart: regular, rate, and rhythm.  Noted murmur.  Palpable pedal pulses bilaterally  Lungs: CTAB, no wheezes, rhonchi, or rales noted.  Respiratory effort nonlabored Abd :Soft, distended with +BS. Patient has epigastric > RUQ ttp. No guarding or rigidity on exam. There are no obvious masses, hernias, or organomegaly MS: no BUE/BLE edema, calves soft and nontender Skin: warm and dry with no masses, lesions, or rashes Psych: A&Ox4 with an appropriate affect Neuro: cranial nerves grossly intact, equal strength in BUE/BLE bilaterally, normal speech, though process intact  Results for orders placed or performed during the hospital encounter of 02/26/20 (from the past 48 hour(s))  Lactic acid, plasma     Status: Abnormal   Collection Time: 02/26/20  2:40 PM  Result Value Ref Range   Lactic Acid, Venous 2.1 (HH) 0.5 - 1.9 mmol/L    Comment: CRITICAL RESULT CALLED TO, READ BACK BY AND VERIFIED WITH: G.TATE RN [id number] 02/26/20 MCCORMICK K Performed at [facility], [place] [address], [place], [place] [address]   Type and screen [place]     Status: None (Preliminary result)   Collection Time: 02/26/20  2:44 PM  Result Value Ref Range   ABO/RH(D)  PENDING    Antibody Screen PENDING    Sample Expiration      02/29/2020,2359 Performed at [facility], [place] [address] [place], [place] [address]   I-stat chem 8, ED (not at [facility] or [facility])     Status: Abnormal   Collection Time: 02/26/20  2:52 PM  Result Value Ref Range   Sodium 146 (H) 135 - 145 mmol/L   Potassium 2.1 (LL) 3.5 - 5.1 mmol/L   Chloride 118 (H) 98 - 111 mmol/L   BUN 15 8 - 23 mg/dL   Creatinine, Ser 0.90 0.44 - 1.00 mg/dL   Glucose, Bld 67 (L) 70 - 99 mg/dL    Comment: Glucose reference range applies only to samples taken after fasting for at least 8 hours.   Calcium, Ion 0.74 (LL) 1.15 - 1.40 mmol/L   TCO2 10 (L) 22 - 32 mmol/L   Hemoglobin 5.8 (LL) 12.0 -  15.0 g/dL   HCT 17.0 (L) 36 - 46 %   Comment NOTIFIED PHYSICIAN   I-stat chem 8, ed     Status: Abnormal   Collection Time: 02/26/20  2:58 PM  Result Value Ref Range   Sodium 133 (L) 135 - 145 mmol/L   Potassium 4.4 3.5 - 5.1 mmol/L   Chloride 99 98 - 111 mmol/L   BUN 33 (H) 8 - 23 mg/dL   Creatinine, Ser 2.50 (H) 0.44 - 1.00 mg/dL   Glucose, Bld 136 (H) 70 - 99 mg/dL    Comment: Glucose reference range applies only to samples taken after fasting for at least 8 hours.   Calcium, Ion 1.20 1.15 - 1.40 mmol/L   TCO2 21 (L) 22 - 32 mmol/L   Hemoglobin 12.2 12.0 - 15.0 g/dL   HCT 36.0 36 - 46 %  CBC with Differential/Platelet     Status: Abnormal   Collection Time: 02/26/20  3:06 PM  Result Value Ref Range   WBC 10.2 4.0 - 10.5 K/uL   RBC 3.58 (L) 3.87 - 5.11 MIL/uL   Hemoglobin 11.6 (L) 12.0 - 15.0 g/dL   HCT 35.9 (L) 36 - 46 %   MCV 100.3 (H) 80.0 - 100.0 fL   MCH 32.4 26.0 - 34.0 pg   MCHC 32.3 30.0 - 36.0 g/dL   RDW 12.4 11.5 - 15.5 %   Platelets 320 150 - 400 K/uL   nRBC 0.0 0.0 - 0.2 %   Neutrophils Relative % 72 %   Neutro Abs 7.2 1.7 - 7.7 K/uL   Lymphocytes Relative 24 %   Lymphs Abs 2.4 0.7 - 4.0 K/uL   Monocytes Relative 4 %   Monocytes Absolute 0.4 0.1 - 1.0 K/uL   Eosinophils  Relative 0 %   Eosinophils Absolute 0.0 0.0 - 0.5 K/uL   Basophils Relative 0 %   Basophils Absolute 0.0 0.0 - 0.1 K/uL   Immature Granulocytes 0 %   Abs Immature Granulocytes 0.02 0.00 - 0.07 K/uL    Comment: Performed at [facility], [address] [place], [place] [address]  Comprehensive metabolic panel     Status: Abnormal   Collection Time: 02/26/20  3:06 PM  Result Value Ref Range   Sodium 133 (L) 135 - 145 mmol/L   Potassium 4.3 3.5 - 5.1 mmol/L   Chloride 98 98 - 111 mmol/L   CO2 20 (L) 22 - 32 mmol/L   Glucose, Bld 142 (H) 70 - 99 mg/dL    Comment: Glucose reference range applies only to samples taken after fasting for at least 8 hours.   BUN 31 (H) 8 - 23 mg/dL   Creatinine, Ser 2.53 (H) 0.44 - 1.00 mg/dL   Calcium 9.9 8.9 - 10.3 mg/dL   Total Protein 7.1 6.5 - 8.1 g/dL   Albumin 4.2 3.5 - 5.0 g/dL   AST 21 15 - 41 U/L   ALT 13 0 - 44 U/L   Alkaline Phosphatase 81 38 - 126 U/L   Total Bilirubin 0.7 0.3 - 1.2 mg/dL   GFR, Estimated 21 (L) >60 mL/min    Comment: (NOTE) Calculated using the CKD-EPI Creatinine Equation (2021)    Anion gap 15 5 - 15    Comment: Performed at [place] [address] [place], [place] [address]  Lipase, blood     Status: None   Collection Time: 02/26/20  3:06 PM  Result Value Ref Range   Lipase  36 11 - 51 U/L    Comment: Performed at [facility], [place] [address] [name], [place] [address]  Protime-INR     Status: None   Collection Time: 02/26/20  3:06 PM  Result Value Ref Range   Prothrombin Time 12.8 11.4 - 15.2 seconds   INR 1.0 0.8 - 1.2    Comment: (NOTE) INR goal varies based on device and disease states. Performed at [facility], [place] [address] [place], [place] [address]   Troponin I (High Sensitivity)     Status: None   Collection Time: 02/26/20  3:06 PM  Result Value Ref Range   Troponin I (High Sensitivity) 16 <18 ng/L    Comment: (NOTE) Elevated high sensitivity troponin I (hsTnI)  values and significant  changes across serial measurements may suggest ACS but many other  chronic and acute conditions are known to elevate hsTnI results.  Refer to the "Links" section for chest pain algorithms and additional  guidance. Performed at [facility], [place] [address], [place], [place] [address]    CT Abdomen Pelvis Wo Contrast  Result Date: 02/26/2020 CLINICAL DATA:  Abdominal pain. EXAM: CT ABDOMEN AND PELVIS WITHOUT CONTRAST TECHNIQUE: Multidetector CT imaging of the abdomen and pelvis was performed following the standard protocol without IV contrast. COMPARISON:  November 27, 2017 FINDINGS: Lower chest: No acute abnormality. Hepatobiliary: No focal liver abnormality is seen. No gallstones, gallbladder wall thickening, or biliary dilatation. Pancreas: Unremarkable. No pancreatic ductal dilatation or surrounding inflammatory changes. Spleen: Normal in size without focal abnormality. Adrenals/Urinary Tract: Adrenal glands are unremarkable. Kidneys are normal in size, without renal calculi or hydronephrosis. Stable, adjacent, 2.1 cm x 2.0 cm and 1.5 cm x 1.2 cm exophytic left renal cysts are seen. Bladder is unremarkable. Stomach/Bowel: There is a small hiatal hernia. Appendix appears normal. Mildly dilated small bowel loops are seen within the lower abdomen (maximum small bowel diameter of approximately 3.0 cm). A clear transition zone is not identified. Noninflamed diverticula are seen within the sigmoid colon. Vascular/Lymphatic: There is marked severity calcification of the common iliac arteries, without evidence of aneurysmal dilatation. No enlarged abdominal or pelvic lymph nodes. Reproductive: Status post hysterectomy. No adnexal masses. Other: No abdominal wall hernia or abnormality. No abdominopelvic ascites. Musculoskeletal: Degenerative changes seen within the lumbar spine, most prominent at the level of L5-S1. IMPRESSION: 1. Findings consistent with a mild ileus versus distal  partial small bowel obstruction. 2. Sigmoid diverticulosis. 3. Small hiatal hernia. 4. Stable left renal cysts. 5. Aortic atherosclerosis. Aortic Atherosclerosis (ICD10-I70.0). Electronically Signed   By: Virgina Norfolk M.D.   On: 02/26/2020 15:30   DG Chest Port 1 View  Result Date: 02/26/2020 CLINICAL DATA:  Shortness of breath. EXAM: PORTABLE CHEST 1 VIEW COMPARISON:  November 27, 2017. FINDINGS: The heart size and mediastinal contours are within normal limits. No pneumothorax or pleural effusion is noted. Minimal bibasilar subsegmental atelectasis or scarring is noted. The visualized skeletal structures are unremarkable. IMPRESSION: Minimal bibasilar subsegmental atelectasis or scarring. Aortic Atherosclerosis (ICD10-I70.0). Electronically Signed   By: Marijo Conception M.D.   On: 02/26/2020 15:18   Assessment/Plan Hx HTN Abnormal UA - UA w/ large leukocytes, urine culture pending  AKI - BUN 6, Creatinine 2.5 Lactic acidosis - lactic acid 2.1  Shock   Ileus vs SBO on non-contrast CT study - Patient presenting w/ severe back and abdominal pain. Tachycardia and hypotensive requiring multiple fluid boluses and now on Levo. CT w/o contrast (labs  with AKI) w/ possible distal SBO vs ileus we were asked to see.  - No indication for emergency surgery at this time. - Patient reports similar pain in 2018 when she had pancreatitis. Her Lipase is 26. LFT's wnl. No obvious stranding around pancreas. Her GB is without inflammation on the CT. Will add amylase.  - Patient reports poor intake for the last 2 weeks. She is presenting with AKI, lactic of 2.1, tachycardia and hypotension. Unclear etiology of her shock. Possible dehydration. ? Infection. Other etiologies and workup (UCx, CXR, blood cx's etc) should be considered. She does not have an acute abdomen. She currently has 3L bolus ordered and has received 1L so far and is on her 2nd liter. She was started on Levo while I was in the room. Her WBC is 10.2 and  her abdomen is soft on exam. No indication to take her emergently to the OR. Agree with continued fluid resuscitation and admission to CCM. I have written for repeat lactic and labs in 2 hours to trend her lactic and wbc. We will also follow with serial abdominal exams later tonight to ensure she is not worsening.  - If patient has further episodes of emesis, can place an NGT and follow with an abd xray to confirm placement. Please leave her NPO.  - We will follow with you  Jillyn Ledger, [facility] Surgery Please see Amion for pager number during day hours 7:00am-4:30pm 02/26/2020, 4:05 PM

## 2020-02-26 NOTE — Progress Notes (Signed)
Notified bedside nurse of need to draw blood cultures.  

## 2020-02-26 NOTE — ED Triage Notes (Signed)
Pt bib [facility] from urgent care. Pt presented with sudden onset lower abdominal pain that radiates to back. Urgent care noted her to be hypotensive on arrival. EMS reports manual BP of 60/40 with HR of 110. 2 mg zofran given at urgent care. 200 ml NS given en route.

## 2020-02-26 NOTE — ED Provider Notes (Signed)
[place]    CSN: [id number] Arrival date & time: 02/26/20  1303      History   Chief Complaint Chief Complaint  Patient presents with  . Abdominal Pain    since this am  . Emesis    since this am    HPI Lori [name] is a 64 y.o. female.   HPI Patient presents today with active vomiting, back pain, epigastric pain.  Patient reports eating an apple immediately and after experiencing , severe , vomiting. She is unable to sit up as she reports excruciating generalized back pain and sharp mid epigastric pain.  She denies any chest pain however she is diaphoretic. She is a poor historian unable to provide a history of how long she has been feeling nausea, abdominal pain and just feeling not at her baseline.  She reports that her back pain is excruciating.  Patient also denies any history of any renal stones or dysuria.  She reports chronic constipation and has not had any loose stools today. She she was brought to UC by family member and there is no one here with her today.  Past Medical History:  Diagnosis Date  . Arthritis   . Depression   . Depression with anxiety Aug 18, 2008   Patient had multiple death in her family over a short period of time. father, brother, uncle and niece who was stabbed 3 years ago. Patient's husband had a car accident last year and  need currently a feeding tube.   Marland Kitchen GERD (gastroesophageal reflux disease)   . Gout   . Headache(784.0)    migraines  . History of cervical cancer  1985    status post partial hysterectomy , last Pap smear 10 years ago , no further followup  . History of cocaine abuse ([place]) 10/23/2008    last documented in Aug 18, 2005 when admitted for hypertensive urgency  . Hypertension 2005-08-18   Admitted for HTN crisis in 10/2008. Was given in 08-18-08 a wrong prescription from the pharmacy and per ED note it was Lisinopril-Hydrocholorthizide which gave her a  hives and feeling of sickness. Patient was started  on this meds on 05/31/2010  by Dr  Ihor Gully without any problem.   . Insomnia   . Tobacco abuse     35 years  . Uterine cancer [facility])     Patient Active Problem List   Diagnosis Date Noted  . Hx of adenomatous colonic polyps   . Benign neoplasm of transverse colon   . Bloating   . Gastritis and gastroduodenitis   . Bilateral foot pain 01/15/2017  . 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 Normocytic anemia 09/12/2016  . Hyponatremia 09/12/2016  . Acute pancreatitis 09/11/2016  . Shin splints, sequela 08/16/2016  . Cellulitis of groin 01/05/2016  . Encounter for screening mammogram for breast cancer 07/02/2015  . 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 Allergic rhinitis 11/27/2014  . Otitis media of right ear 07/01/2012  . Cough 11/20/2011  . Renal lesion 06/12/2011  . Nausea 05/10/2011  . Caries 04/12/2011  . Abdominal pain 10/25/2010  . Preventive measure 10/25/2010  . Shoulder pain, left 10/25/2010  . Leg pain 06/20/2010  . CERVICAL CANCER 10/23/2008  . TOBACCO ABUSE 10/23/2008  . Depression with anxiety 10/23/2008  . Essential hypertension 10/23/2008  . GERD 10/23/2008  . Insomnia 10/23/2008  . SNORING 10/23/2008    Past Surgical History:  Procedure Laterality Date  . CESAREAN SECTION  1985  . COLONOSCOPY WITH PROPOFOL  N/A 03/01/2017   Procedure: COLONOSCOPY WITH PROPOFOL;  Surgeon: Milus Banister, MD;  Location: [facility];  Service: Endoscopy;  Laterality: N/A;  . ESOPHAGOGASTRODUODENOSCOPY (EGD) WITH PROPOFOL N/A 03/01/2017   Procedure: ESOPHAGOGASTRODUODENOSCOPY (EGD) WITH PROPOFOL;  Surgeon: Milus Banister, MD;  Location: [facility];  Service: Endoscopy;  Laterality: N/A;  . PARTIAL HYSTERECTOMY    . SKIN GRAFT      OB History   No obstetric history on file.      Home Medications    Prior to Admission medications   Medication Sig Start Date End Date Taking? Authorizing Provider  acetaminophen (TYLENOL) 325 MG tablet Take 2 tablets (650 mg total) by mouth  every 6 (six) hours as needed. 11/03/19   Darr, Marguerita Beards, PA-C  albuterol (VENTOLIN HFA) 108 (90 Base) MCG/ACT inhaler Inhale 1-2 puffs into the lungs every 6 (six) hours as needed for wheezing or shortness of breath. 11/03/19   Darr, Marguerita Beards, PA-C  Colchicine 0.6 MG CAPS Take 1 capsule by mouth daily as needed. Patient taking differently: Take 1 capsule by mouth daily as needed (gout pain).  05/30/17   Suzan Slick, NP  diclofenac Sodium (VOLTAREN) 1 % GEL Apply 4 g topically 4 (four) times daily. 11/03/19   Darr, Marguerita Beards, PA-C  diphenhydramine-acetaminophen (TYLENOL PM) 25-500 MG TABS tablet Take 2 tablets by mouth at bedtime as needed (for sleep).    [provider]  gabapentin (NEURONTIN) 300 MG capsule TAKE 1 CAPSULE BY MOUTH EVERYDAY AT BEDTIME 11/09/17   Newt Minion, MD  lisinopril (PRINIVIL,ZESTRIL) 20 MG tablet TAKE 1 TABLET (20 MG TOTAL) BY MOUTH DAILY. 07/07/16   Burgess Estelle, MD  NICOTINE STEP 2 14 MG/24HR patch Place 1 patch onto the skin daily as needed (smoking cessation).  01/11/17   [provider]  ondansetron (ZOFRAN) 4 MG tablet Take 1 tablet (4 mg total) by mouth every 6 (six) hours as needed for nausea. 09/12/16   Janece Canterbury, MD  pantoprazole (PROTONIX) 40 MG tablet Take 1 tablet (40 mg total) by mouth 2 (two) times daily. Patient taking differently: Take 40 mg by mouth daily.  03/05/17 11/27/17  Milus Banister, MD  QUEtiapine (SEROQUEL) 25 MG tablet Take 25 mg by mouth at bedtime.  12/15/16   [provider]  sucralfate (CARAFATE) 1 g tablet Take 1 tablet (1 g total) by mouth 4 (four) times daily -  with meals and at bedtime. 11/27/17   Domenic Moras, PA-C  sucralfate (CARAFATE) 1 GM/10ML suspension Take 1 gram ( 10 ml) between meals and at bedtime. 12/27/17   Esterwood, Amy S, PA-C  traMADol (ULTRAM) 50 MG tablet Take 1 tablet by mouth every 6 hours as needed for pain. 12/27/17   Esterwood, Amy S, PA-C  traZODone (DESYREL) 100 MG tablet Take 50 mg by  mouth at bedtime as needed for sleep.  12/15/16   [provider]  zolpidem (AMBIEN) 5 MG tablet Take 5 mg by mouth at bedtime as needed for sleep.  12/06/16   [provider]    Family History Family History  Problem Relation Age of Onset  . Thyroid disease Mother   . Diabetes Father   . Heart disease Father   . Hyperlipidemia Father   . Hypertension Father   . Diabetes Brother   . Hypertension Brother   . Hyperlipidemia Brother   . Colon polyps Brother   . Thyroid disease Daughter   . Obesity Daughter   .  Mental illness Daughter   . Colon cancer Maternal Uncle   . Stomach cancer Neg Hx   . Pancreatic cancer Neg Hx     Social History Social History   Tobacco Use  . Smoking status: Light Tobacco Smoker    Packs/day: 0.50    Years: 43.00    Pack years: 21.50    Last attempt to quit: 09/05/2016    Years since quitting: 3.4  . Smokeless tobacco: Never Used  . Tobacco comment: patches have helped in the past  Vaping Use  . Vaping Use: Never used  Substance Use Topics  . Alcohol use: No  . Drug use: No     Allergies   Patient has no known allergies.   Review of Systems Review of Systems Pertinent negatives listed in HPI Physical Exam Triage Vital Signs ED Triage Vitals  Enc Vitals Group     BP      Pulse      Resp      Temp      Temp src      SpO2      Weight      Height      Head Circumference      Peak Flow      Pain Score      Pain Loc      Pain Edu?      Excl. in [place]?    No data found.  Updated Vital Signs There were no vitals taken for this visit.  Visual Acuity Right Eye Distance:   Left Eye Distance:   Bilateral Distance:    Right Eye Near:   Left Eye Near:    Bilateral Near:     Physical Exam Constitutional:      General: She is in acute distress.     Appearance: She is toxic-appearing and diaphoretic.  Cardiovascular:     Rate and Rhythm: Tachycardia present.  Pulmonary:     Effort: Tachypnea present.      Breath sounds: Decreased breath sounds present.  Abdominal:     General: Bowel sounds are decreased.     Tenderness: There is abdominal tenderness in the epigastric area.  Neurological:     General: No focal deficit present.  Psychiatric:        Mood and Affect: Mood is anxious.    UC Treatments / Results  Labs (all labs ordered are listed, but only abnormal results are displayed) Labs Reviewed  CBG MONITORING, ED - Abnormal; Notable for the following components:      Result Value   Glucose-Capillary 143 (*)    All other components within normal limits  POCT URINALYSIS DIPSTICK, ED / UC - Abnormal; Notable for the following components:   Hgb urine dipstick TRACE (*)    Leukocytes,Ua LARGE (*)    All other components within normal limits  URINE CULTURE    EKG   Radiology No results found.  Procedures Procedures (including critical care time)  Medications Ordered in UC Medications - No data to display  Initial Impression / Assessment and Plan / UC Course  I have reviewed the triage vital signs and the nursing notes.  Pertinent labs & imaging results that were available during my care of the patient were reviewed by me and considered in my medical decision making (see chart for details).    Given hypotension, severe abdominal pain, severe back pain and diaphoreses and inability to cooperate with exam patient has been transported via EMS to the ER.  UA was significant for large leukocytes however patient denies experiencing any pain with urination. She is afebrile. Patient has also received an IM dose of Zofran which improved active vomiting.    Final Clinical Impressions(s) / UC Diagnoses   Final diagnoses:  Epigastric pain  Intractable vomiting with nausea, unspecified vomiting type  Acute bilateral back pain, unspecified back location  Hypotension, unspecified hypotension type   Discharge Instructions   None    ED Prescriptions    None     PDMP not reviewed  this encounter.   Scot Jun, FNP 02/26/20 1404

## 2020-02-26 NOTE — Consult Note (Signed)
NAME:  Lori [name], MRN:  [id number], DOB:  Oct 01, 1955, LOS: 0 ADMISSION DATE:  02/26/2020, CONSULTATION DATE:  02/26/20 REFERRING MD:  EDP, CHIEF COMPLAINT:  Abdominal pain/vomiting   Brief History   64 y.o. F with PMH of GERD, HTN, Depression, Arthritis, cocaine abuse who presents with about two weeks of nausea and poor po intake with vomiting and epigastric abdominal pain radiating to the back that developed today.  CT with possible obstruction and BP soft.    History of present illness   Lori [name] is a 64 y.o. F with PMH of GERD, HTN, Depression, Arthritis, cocaine abuse who presents with about two weeks of nausea and poor po intake.  Today, she developed severe mid-abdominal and epigastric pain radiating to the back and multiple episodes of vomiting.   On arrival to the ED, she was hypotensive 60/40 and tachycardic though afebrile.  Labs significant for lactic acid of 2.1, creatinine rising from 0.9 to 2.5 during ED course and K of 2.1.  Lipase and troponin were WNL.   Non-contrasted CT with mild ileus versus distal partial small bowel obstruction.  She received ~2.5L IVF and BP still soft, so was started on Levophed and PCCM consulted.   At the time of exam pt actively vomiting   Past Medical History   has a past medical history of Arthritis, Depression, Depression with anxiety (2010), GERD (gastroesophageal reflux disease), Gout, Headache(784.0), History of cervical cancer ( 1985), History of cocaine abuse ([place]) (10/23/2008), Hypertension (2007), Insomnia, Tobacco abuse, and Uterine cancer ([place]).   Significant Hospital Events   10/21 Presented to ED  Consults:  PCCM  General surgery  Procedures:    Significant Diagnostic Tests:  10/21 CT abdomen/Pelvis>> 10/21 CXR>>Minimal bibasilar subsegmental atelectasis or scarring.  Micro Data:  10/21 Covid-19>>Covid-19, flu>> 10/21 BCx2>>  Antimicrobials:  Cefepime 10/21- Flagyl 10/21-  Interim history/subjective:  As  above  Objective   Blood pressure (!) 96/50, resp. rate (!) 21, SpO2 100 %.        Intake/Output Summary (Last 24 hours) at 02/26/2020 1647 Last data filed at 02/26/2020 1628 Gross per 24 hour  Intake 201.59 ml  Output --  Net 201.59 ml   There were no vitals filed for this visit.  General:  Non-toxic though Uncomfortable-appearing F actively vomiting HEENT: MM pink/moist Neuro: awake and oriented x4 CV: s1s2 rrr, no m/r/g PULM:  Clear bilaterally on room air GI: soft, moderate-severe epigastric and mid-abdominal pain with mild TTP in the lower quadrants.  No rebound or guarding, hypoactive bowel sounds Extremities: warm/dry, no edema, 2+ peripheral pulses Skin: no rashes or lesions   Resolved Hospital Problem list     Assessment & Plan:   Abdominal pain, nausea, vomiting: Ileus vs partial SBO -CT a/p 10/21 -presents w n/v/severe abdominal pain, 1 day of constipation -does have hx pancreatitis, doesn't fit w/ current clinical picture  P -NGT for gastric decompression -PRN zofran -CCS consulted and following. No indication for surgery at this time  -Continue IVF  -empiric cefepime and flagyl started in ED   Hypovolemic shock, improved  -hypotensive after 1.5 L IVF administered. At 3L IVF, normotensive  P -continue IVF. Ordering additional L LR followed by mIVF -Cardiac monitoring -MAP goal > 65 -BCx ordered in event that sepsis contributing to presenting hypotension.   AKI -suspect in setting of hypovolemia -adequate UOP with IVF administration P -trend renal indices, UOP  -IVF as above   Lactic acidosis, mild -trend    PCCM will  sign off, please re-engage for worsening clinical status or any concerns    Labs   CBC: Recent Labs  Lab 02/26/20 1452 02/26/20 1458 02/26/20 1506  WBC  --   --  10.2  NEUTROABS  --   --  7.2  HGB 5.8* 12.2 11.6*  HCT 17.0* 36.0 35.9*  MCV  --   --  100.3*  PLT  --   --  237    Basic Metabolic Panel: Recent  Labs  Lab 02/26/20 1452 02/26/20 1458 02/26/20 1506  NA 146* 133* 133*  K 2.1* 4.4 4.3  CL 118* 99 98  CO2  --   --  20*  GLUCOSE 67* 136* 142*  BUN 15 33* 31*  CREATININE 0.90 2.50* 2.53*  CALCIUM  --   --  9.9   GFR: CrCl cannot be calculated (Unknown ideal weight.). Recent Labs  Lab 02/26/20 1440 02/26/20 1506  WBC  --  10.2  LATICACIDVEN 2.1*  --     Liver Function Tests: Recent Labs  Lab 02/26/20 1506  AST 21  ALT 13  ALKPHOS 81  BILITOT 0.7  PROT 7.1  ALBUMIN 4.2   Recent Labs  Lab 02/26/20 1506  LIPASE 36   No results for input(s): AMMONIA in the last 168 hours.  ABG    Component Value Date/Time   TCO2 21 (L) 02/26/2020 1458     Coagulation Profile: Recent Labs  Lab 02/26/20 1506  INR 1.0    Cardiac Enzymes: No results for input(s): CKTOTAL, CKMB, CKMBINDEX, TROPONINI in the last 168 hours.  HbA1C: Hemoglobin A1C  Date/Time Value Ref Range Status  07/02/2015 03:02 PM 5.5  Final  05/10/2011 04:50 PM 5.5  Final    CBG: Recent Labs  Lab 02/26/20 1353  GLUCAP 143*    Review of Systems:   Negative except as noted in HPI  Past Medical History  She,  has a past medical history of Arthritis, Depression, Depression with anxiety (2010), GERD (gastroesophageal reflux disease), Gout, Headache(784.0), History of cervical cancer ( 1985), History of cocaine abuse ([place]) (10/23/2008), Hypertension (2007), Insomnia, Tobacco abuse, and Uterine cancer ([place]).   Surgical History    Past Surgical History:  Procedure Laterality Date  . CESAREAN SECTION  1985  . COLONOSCOPY WITH PROPOFOL N/A 03/01/2017   Procedure: COLONOSCOPY WITH PROPOFOL;  Surgeon: Milus Banister, MD;  [facility]: [facility];  Service: Endoscopy;  Laterality: N/A;  . ESOPHAGOGASTRODUODENOSCOPY (EGD) WITH PROPOFOL N/A 03/01/2017   Procedure: ESOPHAGOGASTRODUODENOSCOPY (EGD) WITH PROPOFOL;  Surgeon: Milus Banister, MD;  Location[facility];  Service: Endoscopy;  Laterality:  N/A;  . PARTIAL HYSTERECTOMY    . SKIN GRAFT       Social History   reports that she has been smoking. She has a 21.50 pack-year smoking history. She has never used smokeless tobacco. She reports that she does not drink alcohol and does not use drugs.   Family History   Her family history includes Colon cancer in her maternal uncle; Colon polyps in her brother; Diabetes in her brother and father; Heart disease in her father; Hyperlipidemia in her brother and father; Hypertension in her brother and father; Mental illness in her daughter; Obesity in her daughter; Thyroid disease in her daughter and mother. There is no history of Stomach cancer or Pancreatic cancer.   Allergies No Known Allergies   Home Medications  Prior to Admission medications   Medication Sig Start Date End Date Taking? Authorizing Provider  acetaminophen (TYLENOL) 325  MG tablet Take 2 tablets (650 mg total) by mouth every 6 (six) hours as needed. 11/03/19   Darr, Marguerita Beards, PA-C  albuterol (VENTOLIN HFA) 108 (90 Base) MCG/ACT inhaler Inhale 1-2 puffs into the lungs every 6 (six) hours as needed for wheezing or shortness of breath. 11/03/19   Darr, Marguerita Beards, PA-C  Colchicine 0.6 MG CAPS Take 1 capsule by mouth daily as needed. Patient taking differently: Take 1 capsule by mouth daily as needed (gout pain).  05/30/17   Suzan Slick, NP  diclofenac Sodium (VOLTAREN) 1 % GEL Apply 4 g topically 4 (four) times daily. 11/03/19   Darr, Marguerita Beards, PA-C  diphenhydramine-acetaminophen (TYLENOL PM) 25-500 MG TABS tablet Take 2 tablets by mouth at bedtime as needed (for sleep).    [provider]  gabapentin (NEURONTIN) 300 MG capsule TAKE 1 CAPSULE BY MOUTH EVERYDAY AT BEDTIME 11/09/17   Newt Minion, MD  lisinopril (PRINIVIL,ZESTRIL) 20 MG tablet TAKE 1 TABLET (20 MG TOTAL) BY MOUTH DAILY. 07/07/16   Burgess Estelle, MD  NICOTINE STEP 2 14 MG/24HR patch Place 1 patch onto the skin daily as needed (smoking cessation).  01/11/17    [provider]  ondansetron (ZOFRAN) 4 MG tablet Take 1 tablet (4 mg total) by mouth every 6 (six) hours as needed for nausea. 09/12/16   Janece Canterbury, MD  pantoprazole (PROTONIX) 40 MG tablet Take 1 tablet (40 mg total) by mouth 2 (two) times daily. Patient taking differently: Take 40 mg by mouth daily.  03/05/17 11/27/17  Milus Banister, MD  QUEtiapine (SEROQUEL) 25 MG tablet Take 25 mg by mouth at bedtime.  12/15/16   [provider]  sucralfate (CARAFATE) 1 g tablet Take 1 tablet (1 g total) by mouth 4 (four) times daily -  with meals and at bedtime. 11/27/17   Domenic Moras, PA-C  sucralfate (CARAFATE) 1 GM/10ML suspension Take 1 gram ( 10 ml) between meals and at bedtime. 12/27/17   Esterwood, Amy S, PA-C  traMADol (ULTRAM) 50 MG tablet Take 1 tablet by mouth every 6 hours as needed for pain. 12/27/17   Esterwood, Amy S, PA-C  traZODone (DESYREL) 100 MG tablet Take 50 mg by mouth at bedtime as needed for sleep.  12/15/16   [provider]  zolpidem (AMBIEN) 5 MG tablet Take 5 mg by mouth at bedtime as needed for sleep.  12/06/16   [provider]     Critical care time: 35 minutes    CRITICAL CARE Performed by: Otilio Carpen Dessirae Scarola   Total critical care time: 35 minutes  Critical care time was exclusive of separately billable procedures and treating other patients.  Critical care was necessary to treat or prevent imminent or life-threatening deterioration.  Critical care was time spent personally by me on the following activities: development of treatment plan with patient and/or surrogate as well as nursing, discussions with consultants, evaluation of patient's response to treatment, examination of patient, obtaining history from patient or surrogate, ordering and performing treatments and interventions, ordering and review of laboratory studies, ordering and review of radiographic studies, pulse oximetry and re-evaluation of patient's condition.  Otilio Carpen  Dominigue Gellner, PA-C [facility]  Pager# [phone], if no answer [phone]

## 2020-02-26 NOTE — Progress Notes (Addendum)
Pharmacy Antibiotic Note  Lori [name] is a 64 y.o. female admitted on 02/26/2020 with intra-abdominal infection. Pt reports sx of acute onset bilateral flank pain and vomiting with fevers/chills.  Pharmacy has been consulted for cefepime dosing. This is day 1 of therapy. WBC 10.2, afebrile with LA of 2.1.   Plan: Cefepime 2g IV q24h Monitor clinical progress, renal function, micro data, and CBC   Height: 5\' 2"  (157.5 cm) Weight: 84.4 kg (186 lb) IBW/kg (Calculated) : 50.1  Temp (24hrs), Avg:97.6 F (36.4 C), Min:97.6 F (36.4 C), Max:97.6 F (36.4 C)  Recent Labs  Lab 02/26/20 1440 02/26/20 1452 02/26/20 1458 02/26/20 1506  WBC  --   --   --  10.2  CREATININE  --  0.90 2.50* 2.53*  LATICACIDVEN 2.1*  --   --   --     Estimated Creatinine Clearance: 22.9 mL/min (A) (by C-G formula based on SCr of 2.53 mg/dL (H)).    No Known Allergies  Antimicrobials this admission: Cefepime 10/21> Flagyl x1 10/21>  Microbiology results: 10/21 BCx ordered 10/21 Resp panel ordered 10/21 UCx sent   Thank you for allowing pharmacy to be a part of this patient's care.  Carolin Guernsey  PGY1 Pharmacy Resident 02/26/2020 5:19 PM

## 2020-02-26 NOTE — ED Triage Notes (Signed)
Pt states she has had severe abdominal pain and vomiting since this am. Pt is actively vomiting in triage. Pt is aox4 and ambulates with a lot of difficulty. Needs a wheelchair at this time.

## 2020-02-26 NOTE — ED Provider Notes (Signed)
[place] EMERGENCY DEPARTMENT Provider Note   CSN: [id number] Arrival date & time: 02/26/20  08/15/36     History Chief Complaint  Patient presents with  . Abdominal Pain  . Hypotension    Lori [name] is a 64 y.o. female.  The history is provided by the patient, the EMS personnel and medical records.  Abdominal Pain  Lori [name] is a 64 y.o. female who presents to the Emergency Department complaining of abdominal pain and vomiting. Level V caveat due to distress. History is provided by patient and EMS. She presents the emergency department complaining of sudden onset lower abdominal pain that radiates to her back. She was found to be hypotensive on urgent care arrival with blood pressures in the 60s. She was treated with 200 mL of normal saline prior to ED presentation. She reports a burning pain that is throughout her abdomen and radiates to her back bilaterally. The pain began today. She has numerous episodes of associated vomiting. She denies any fevers. She does report feeling unwell for the last 2 to 3 weeks with poor appetite and feeling unwell. No prior similar symptoms. She has a history of hypertension, no additional medical problems. She has not been vaccinated for COVID-19.    Past Medical History:  Diagnosis Date  . Arthritis   . Depression   . Depression with anxiety 08/15/08   Patient had multiple death in her family over a short period of time. father, brother, uncle and niece who was stabbed 3 years ago. Patient's husband had a car accident last year and  need currently a feeding tube.   Marland Kitchen GERD (gastroesophageal reflux disease)   . Gout   . Headache(784.0)    migraines  . History of cervical cancer  1985    status post partial hysterectomy , last Pap smear 10 years ago , no further followup  . History of cocaine abuse ([place]) 10/23/2008    last documented in 08/15/2005 when admitted for hypertensive urgency  . Hypertension 08-15-2005   Admitted  for HTN crisis in 10/2008. Was given in 08-15-08 a wrong prescription from the pharmacy and per ED note it was Lisinopril-Hydrocholorthizide which gave her a  hives and feeling of sickness. Patient was started  on this meds on 05/31/2010 by Dr  Ihor Gully without any problem.   . Insomnia   . Tobacco abuse     35 years  . Uterine cancer [facility])     Patient Active Problem List   Diagnosis Date Noted  . Hx of adenomatous colonic polyps   . Benign neoplasm of transverse colon   . Bloating   . Gastritis and gastroduodenitis   . Bilateral foot pain 01/15/2017  . 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 Normocytic anemia 09/12/2016  . Hyponatremia 09/12/2016  . Acute pancreatitis 09/11/2016  . Shin splints, sequela 08/16/2016  . Cellulitis of groin 01/05/2016  . Encounter for screening mammogram for breast cancer 07/02/2015  . 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 Allergic rhinitis 11/27/2014  . Otitis media of right ear 07/01/2012  . Cough 11/20/2011  . Renal lesion 06/12/2011  . Nausea 05/10/2011  . Caries 04/12/2011  . Abdominal pain 10/25/2010  . Preventive measure 10/25/2010  . Shoulder pain, left 10/25/2010  . Leg pain 06/20/2010  . CERVICAL CANCER 10/23/2008  . TOBACCO ABUSE 10/23/2008  . Depression with anxiety 10/23/2008  . Essential hypertension 10/23/2008  . GERD 10/23/2008  . Insomnia 10/23/2008  . SNORING 10/23/2008  .  History of cervical cancer 1985    Past Surgical History:  Procedure Laterality Date  . CESAREAN SECTION  1985  . COLONOSCOPY WITH PROPOFOL N/A 03/01/2017   Procedure: COLONOSCOPY WITH PROPOFOL;  Surgeon: Milus Banister, MD;  Location: [facility] ENDOSCOPY;  Service: Endoscopy;  Laterality: N/A;  . ESOPHAGOGASTRODUODENOSCOPY (EGD) WITH PROPOFOL N/A 03/01/2017   Procedure: ESOPHAGOGASTRODUODENOSCOPY (EGD) WITH PROPOFOL;  Surgeon: Milus Banister, MD;  Location: [facility] ENDOSCOPY;  Service: Endoscopy;  Laterality: N/A;  . PARTIAL HYSTERECTOMY    .  SKIN GRAFT       OB History   No obstetric history on file.     Family History  Problem Relation Age of Onset  . Thyroid disease Mother   . Diabetes Father   . Heart disease Father   . Hyperlipidemia Father   . Hypertension Father   . Diabetes Brother   . Hypertension Brother   . Hyperlipidemia Brother   . Colon polyps Brother   . Thyroid disease Daughter   . Obesity Daughter   . Mental illness Daughter   . Colon cancer Maternal Uncle   . Stomach cancer Neg Hx   . Pancreatic cancer Neg Hx     Social History   Tobacco Use  . Smoking status: Light Tobacco Smoker    Packs/day: 0.50    Years: 43.00    Pack years: 21.50    Last attempt to quit: 09/05/2016    Years since quitting: 3.4  . Smokeless tobacco: Never Used  . Tobacco comment: patches have helped in the past  Vaping Use  . Vaping Use: Never used  Substance Use Topics  . Alcohol use: No  . Drug use: No    Home Medications Prior to Admission medications   Medication Sig Start Date End Date Taking? Authorizing Provider  acetaminophen (TYLENOL) 325 MG tablet Take 2 tablets (650 mg total) by mouth every 6 (six) hours as needed. 11/03/19   Darr, Marguerita Beards, PA-C  albuterol (VENTOLIN HFA) 108 (90 Base) MCG/ACT inhaler Inhale 1-2 puffs into the lungs every 6 (six) hours as needed for wheezing or shortness of breath. 11/03/19   Darr, Marguerita Beards, PA-C  Colchicine 0.6 MG CAPS Take 1 capsule by mouth daily as needed. Patient taking differently: Take 1 capsule by mouth daily as needed (gout pain).  05/30/17   Suzan Slick, NP  diclofenac Sodium (VOLTAREN) 1 % GEL Apply 4 g topically 4 (four) times daily. 11/03/19   Darr, Marguerita Beards, PA-C  diphenhydramine-acetaminophen (TYLENOL PM) 25-500 MG TABS tablet Take 2 tablets by mouth at bedtime as needed (for sleep).    [provider]  gabapentin (NEURONTIN) 300 MG capsule TAKE 1 CAPSULE BY MOUTH EVERYDAY AT BEDTIME 11/09/17   Newt Minion, MD  lisinopril (PRINIVIL,ZESTRIL) 20 MG  tablet TAKE 1 TABLET (20 MG TOTAL) BY MOUTH DAILY. 07/07/16   Burgess Estelle, MD  NICOTINE STEP 2 14 MG/24HR patch Place 1 patch onto the skin daily as needed (smoking cessation).  01/11/17   [provider]  ondansetron (ZOFRAN) 4 MG tablet Take 1 tablet (4 mg total) by mouth every 6 (six) hours as needed for nausea. 09/12/16   Janece Canterbury, MD  pantoprazole (PROTONIX) 40 MG tablet Take 1 tablet (40 mg total) by mouth 2 (two) times daily. Patient taking differently: Take 40 mg by mouth daily.  03/05/17 11/27/17  Milus Banister, MD  QUEtiapine (SEROQUEL) 25 MG tablet Take 25 mg by mouth at bedtime.  12/15/16  [provider]  sucralfate (CARAFATE) 1 g tablet Take 1 tablet (1 g total) by mouth 4 (four) times daily -  with meals and at bedtime. 11/27/17   Domenic Moras, PA-C  sucralfate (CARAFATE) 1 GM/10ML suspension Take 1 gram ( 10 ml) between meals and at bedtime. 12/27/17   Esterwood, Amy S, PA-C  traMADol (ULTRAM) 50 MG tablet Take 1 tablet by mouth every 6 hours as needed for pain. 12/27/17   Esterwood, Amy S, PA-C  traZODone (DESYREL) 100 MG tablet Take 50 mg by mouth at bedtime as needed for sleep.  12/15/16   [provider]  zolpidem (AMBIEN) 5 MG tablet Take 5 mg by mouth at bedtime as needed for sleep.  12/06/16   [provider]    Allergies    Patient has no known allergies.  Review of Systems   Review of Systems  Gastrointestinal: Positive for abdominal pain.  All other systems reviewed and are negative.   Physical Exam Updated Vital Signs BP (!) 71/60   Resp (!) 23   SpO2 100%   Physical Exam Vitals and nursing note reviewed.  Constitutional:      General: She is in acute distress.     Appearance: She is well-developed. She is ill-appearing.  HENT:     Head: Normocephalic and atraumatic.  Cardiovascular:     Rate and Rhythm: Regular rhythm. Tachycardia present.     Heart sounds: No murmur heard.   Pulmonary:     Effort: Pulmonary  effort is normal. No respiratory distress.     Breath sounds: Normal breath sounds.  Abdominal:     General: There is distension.     Tenderness: There is guarding. There is no rebound.     Comments: Moderate generalized abdominal tenderness  Musculoskeletal:        General: No tenderness.     Comments: 2+ femoral pulses bilaterally  Skin:    Coloration: Skin is pale.     Comments: Cool and clammy extremities  Neurological:     Mental Status: She is alert and oriented to person, place, and time.  Psychiatric:        Behavior: Behavior normal.     ED Results / Procedures / Treatments   Labs (all labs ordered are listed, but only abnormal results are displayed) Labs Reviewed  LACTIC ACID, PLASMA - Abnormal; Notable for the following components:      Result Value   Lactic Acid, Venous 2.1 (*)    All other components within normal limits  CBC WITH DIFFERENTIAL/PLATELET - Abnormal; Notable for the following components:   RBC 3.58 (*)    Hemoglobin 11.6 (*)    HCT 35.9 (*)    MCV 100.3 (*)    All other components within normal limits  COMPREHENSIVE METABOLIC PANEL - Abnormal; Notable for the following components:   Sodium 133 (*)    CO2 20 (*)    Glucose, Bld 142 (*)    BUN 31 (*)    Creatinine, Ser 2.53 (*)    GFR, Estimated 21 (*)    All other components within normal limits  I-STAT CHEM 8, ED - Abnormal; Notable for the following components:   Sodium 146 (*)    Potassium 2.1 (*)    Chloride 118 (*)    Glucose, Bld 67 (*)    Calcium, Ion 0.74 (*)    TCO2 10 (*)    Hemoglobin 5.8 (*)    HCT 17.0 (*)  All other components within normal limits  I-STAT CHEM 8, ED - Abnormal; Notable for the following components:   Sodium 133 (*)    BUN 33 (*)    Creatinine, Ser 2.50 (*)    Glucose, Bld 136 (*)    TCO2 21 (*)    All other components within normal limits  RESPIRATORY PANEL BY RT PCR (FLU A&B, COVID)  CULTURE, BLOOD (SINGLE)  LIPASE, BLOOD  PROTIME-INR  CBC WITH  DIFFERENTIAL/PLATELET  LACTIC ACID, PLASMA  TYPE AND SCREEN  TROPONIN I (HIGH SENSITIVITY)  TROPONIN I (HIGH SENSITIVITY)    EKG EKG Interpretation  Date/Time:  Thursday February 26 2020 14:44:49 EDT Ventricular Rate:  105 PR Interval:    QRS Duration: 80 QT Interval:  349 QTC Calculation: 462 R Axis:   43 Text Interpretation: Sinus tachycardia Nonspecific repol abnormality, diffuse leads Confirmed by Quintella Reichert [phone]) on 02/26/2020 3:05:34 PM   Radiology CT Abdomen Pelvis Wo Contrast  Result Date: 02/26/2020 CLINICAL DATA:  Abdominal pain. EXAM: CT ABDOMEN AND PELVIS WITHOUT CONTRAST TECHNIQUE: Multidetector CT imaging of the abdomen and pelvis was performed following the standard protocol without IV contrast. COMPARISON:  November 27, 2017 FINDINGS: Lower chest: No acute abnormality. Hepatobiliary: No focal liver abnormality is seen. No gallstones, gallbladder wall thickening, or biliary dilatation. Pancreas: Unremarkable. No pancreatic ductal dilatation or surrounding inflammatory changes. Spleen: Normal in size without focal abnormality. Adrenals/Urinary Tract: Adrenal glands are unremarkable. Kidneys are normal in size, without renal calculi or hydronephrosis. Stable, adjacent, 2.1 cm x 2.0 cm and 1.5 cm x 1.2 cm exophytic left renal cysts are seen. Bladder is unremarkable. Stomach/Bowel: There is a small hiatal hernia. Appendix appears normal. Mildly dilated small bowel loops are seen within the lower abdomen (maximum small bowel diameter of approximately 3.0 cm). A clear transition zone is not identified. Noninflamed diverticula are seen within the sigmoid colon. Vascular/Lymphatic: There is marked severity calcification of the common iliac arteries, without evidence of aneurysmal dilatation. No enlarged abdominal or pelvic lymph nodes. Reproductive: Status post hysterectomy. No adnexal masses. Other: No abdominal wall hernia or abnormality. No abdominopelvic ascites. Musculoskeletal:  Degenerative changes seen within the lumbar spine, most prominent at the level of L5-S1. IMPRESSION: 1. Findings consistent with a mild ileus versus distal partial small bowel obstruction. 2. Sigmoid diverticulosis. 3. Small hiatal hernia. 4. Stable left renal cysts. 5. Aortic atherosclerosis. Aortic Atherosclerosis (ICD10-I70.0). Electronically Signed   By: Virgina Norfolk M.D.   On: 02/26/2020 15:30   DG Chest Port 1 View  Result Date: 02/26/2020 CLINICAL DATA:  Shortness of breath. EXAM: PORTABLE CHEST 1 VIEW COMPARISON:  November 27, 2017. FINDINGS: The heart size and mediastinal contours are within normal limits. No pneumothorax or pleural effusion is noted. Minimal bibasilar subsegmental atelectasis or scarring is noted. The visualized skeletal structures are unremarkable. IMPRESSION: Minimal bibasilar subsegmental atelectasis or scarring. Aortic Atherosclerosis (ICD10-I70.0). Electronically Signed   By: Marijo Conception M.D.   On: 02/26/2020 15:18    Procedures Procedures (including critical care time) CRITICAL CARE Performed by: Quintella Reichert   Total critical care time: 35 minutes  Critical care time was exclusive of separately billable procedures and treating other patients.  Critical care was necessary to treat or prevent imminent or life-threatening deterioration.  Critical care was time spent personally by me on the following activities: development of treatment plan with patient and/or surrogate as well as nursing, discussions with consultants, evaluation of patient's response to treatment, examination of patient, obtaining history from patient  or surrogate, ordering and performing treatments and interventions, ordering and review of laboratory studies, ordering and review of radiographic studies, pulse oximetry and re-evaluation of patient's condition.  Medications Ordered in ED Medications  sodium chloride 0.9 % bolus 1,000 mL (has no administration in time range)  sodium  chloride 0.9 % bolus 1,000 mL (has no administration in time range)  norepinephrine (LEVOPHED) 4mg  in 224mL premix infusion (2 mcg/min Intravenous New Bag/Given 02/26/20 1614)  ceFEPIme (MAXIPIME) 2 g in sodium chloride 0.9 % 100 mL IVPB (has no administration in time range)  metroNIDAZOLE (FLAGYL) IVPB 500 mg (has no administration in time range)  sodium chloride 0.9 % bolus 1,000 mL (1,000 mLs Intravenous New Bag/Given 02/26/20 1450)  fentaNYL (SUBLIMAZE) injection 50 mcg (50 mcg Intravenous Given 02/26/20 1523)    ED Course  I have reviewed the triage vital signs and the nursing notes.  Pertinent labs & imaging results that were available during my care of the patient were reviewed by me and considered in my medical decision making (see chart for details).    MDM Rules/Calculators/A&P                         Patient here for evaluation of abdominal pain, vomiting. Patient ill appearing on evaluation with hypotension, poor peripheral perfusion. She was treated with aggressive IV fluid hydration, pain medications, antiemetics. Labs significant for acute kidney injury. CT abdomen pelvis without contrast concerning for developing bowel obstruction. Due to persistent hypotension she was started on leave the Fed. CT scan concerning for possible bowel obstruction. General surgery consulted for further management.  Critical care consulted for admission.    Final Clinical Impression(s) / ED Diagnoses Final diagnoses:  Shock ([place])  Generalized abdominal pain  AKI (acute kidney injury) [facility])    Rx / [place] Orders ED Discharge Orders    None       Quintella Reichert, MD 02/26/20 1617

## 2020-02-26 NOTE — Progress Notes (Signed)
1742 Note to bedside RN about the collection of blood cultures and if they have been drawn, antibiotics charted @ 1638, do not see blood cultures documented

## 2020-02-26 NOTE — ED Notes (Signed)
PT sent to ED for hypotension and ABD pain. Pt transported vis EMS to ED

## 2020-02-26 NOTE — ED Notes (Signed)
Levo d/c verbal order from Eliseo Gum, NP

## 2020-02-27 ENCOUNTER — Encounter (HOSPITAL_COMMUNITY): Payer: Self-pay | Admitting: Internal Medicine

## 2020-02-27 DIAGNOSIS — R109 Unspecified abdominal pain: Secondary | ICD-10-CM

## 2020-02-27 DIAGNOSIS — R1013 Epigastric pain: Secondary | ICD-10-CM | POA: Diagnosis present

## 2020-02-27 DIAGNOSIS — K56609 Unspecified intestinal obstruction, unspecified as to partial versus complete obstruction: Secondary | ICD-10-CM | POA: Diagnosis not present

## 2020-02-27 DIAGNOSIS — I10 Essential (primary) hypertension: Secondary | ICD-10-CM

## 2020-02-27 DIAGNOSIS — F0630 Mood disorder due to known physiological condition, unspecified: Secondary | ICD-10-CM

## 2020-02-27 DIAGNOSIS — K920 Hematemesis: Secondary | ICD-10-CM | POA: Diagnosis present

## 2020-02-27 DIAGNOSIS — I959 Hypotension, unspecified: Secondary | ICD-10-CM

## 2020-02-27 LAB — COMPREHENSIVE METABOLIC PANEL
ALT: 10 U/L (ref 0–44)
AST: 17 U/L (ref 15–41)
Albumin: 3.2 g/dL — ABNORMAL LOW (ref 3.5–5.0)
Alkaline Phosphatase: 58 U/L (ref 38–126)
Anion gap: 9 (ref 5–15)
BUN: 27 mg/dL — ABNORMAL HIGH (ref 8–23)
CO2: 21 mmol/L — ABNORMAL LOW (ref 22–32)
Calcium: 8.4 mg/dL — ABNORMAL LOW (ref 8.9–10.3)
Chloride: 106 mmol/L (ref 98–111)
Creatinine, Ser: 1.48 mg/dL — ABNORMAL HIGH (ref 0.44–1.00)
GFR, Estimated: 40 mL/min — ABNORMAL LOW (ref >60)
Glucose, Bld: 130 mg/dL — ABNORMAL HIGH (ref 70–99)
Potassium: 4.5 mmol/L (ref 3.5–5.1)
Sodium: 136 mmol/L (ref 135–145)
Total Bilirubin: 0.5 mg/dL (ref 0.3–1.2)
Total Protein: 5.7 g/dL — ABNORMAL LOW (ref 6.5–8.1)

## 2020-02-27 LAB — CBC WITH DIFFERENTIAL/PLATELET
Abs Immature Granulocytes: 0.03 10*3/uL (ref 0.00–0.07)
Basophils Absolute: 0 10*3/uL (ref 0.0–0.1)
Basophils Relative: 0 %
Eosinophils Absolute: 0 10*3/uL (ref 0.0–0.5)
Eosinophils Relative: 0 %
HCT: 29.1 % — ABNORMAL LOW (ref 36.0–46.0)
Hemoglobin: 9.4 g/dL — ABNORMAL LOW (ref 12.0–15.0)
Immature Granulocytes: 0 %
Lymphocytes Relative: 23 %
Lymphs Abs: 1.9 10*3/uL (ref 0.7–4.0)
MCH: 32.5 pg (ref 26.0–34.0)
MCHC: 32.3 g/dL (ref 30.0–36.0)
MCV: 100.7 fL — ABNORMAL HIGH (ref 80.0–100.0)
Monocytes Absolute: 0.6 10*3/uL (ref 0.1–1.0)
Monocytes Relative: 8 %
Neutro Abs: 5.6 10*3/uL (ref 1.7–7.7)
Neutrophils Relative %: 69 %
Platelets: 266 10*3/uL (ref 150–400)
RBC: 2.89 MIL/uL — ABNORMAL LOW (ref 3.87–5.11)
RDW: 12.6 % (ref 11.5–15.5)
WBC: 8.2 10*3/uL (ref 4.0–10.5)
nRBC: 0 % (ref 0.0–0.2)

## 2020-02-27 LAB — FOLATE: Folate: 10.1 ng/mL (ref >5.9)

## 2020-02-27 LAB — VITAMIN B12: Vitamin B-12: 379 pg/mL (ref 180–914)

## 2020-02-27 LAB — LACTIC ACID, PLASMA: Lactic Acid, Venous: 1.2 mmol/L (ref 0.5–1.9)

## 2020-02-27 MED ORDER — INFLUENZA VAC SPLIT QUAD 0.5 ML IM SUSY
0.5000 mL | PREFILLED_SYRINGE | INTRAMUSCULAR | Status: AC
  Administered 2020-02-29: 0.5 mL via INTRAMUSCULAR
  Filled 2020-02-27: qty 0.5

## 2020-02-27 MED ORDER — LORAZEPAM 2 MG/ML IJ SOLN
1.0000 mg | Freq: Once | INTRAMUSCULAR | Status: AC
  Administered 2020-02-27: 1 mg via INTRAVENOUS
  Filled 2020-02-27: qty 1

## 2020-02-27 MED ORDER — RAMELTEON 8 MG PO TABS
8.0000 mg | ORAL_TABLET | Freq: Every day | ORAL | Status: DC
  Administered 2020-02-27 – 2020-02-29 (×3): 8 mg via ORAL
  Filled 2020-02-27 (×4): qty 1

## 2020-02-27 MED ORDER — LORAZEPAM 2 MG/ML IJ SOLN
1.0000 mg | Freq: Four times a day (QID) | INTRAMUSCULAR | Status: DC | PRN
  Administered 2020-02-28 (×2): 1 mg via INTRAVENOUS
  Filled 2020-02-27 (×2): qty 1

## 2020-02-27 MED ORDER — LACTATED RINGERS IV SOLN: INTRAVENOUS | Status: DC

## 2020-02-27 NOTE — Progress Notes (Addendum)
[facility] Progress Note     Subjective: Patient reports burning epigastric pain similar to when she was diagnosed with gastritis in the past. +flatus but no BM. Denies nausea. Biggest complaint is coughing from NGT and dental pain.   Objective: Vital signs in last 24 hours: Temp:  [97.6 F (36.4 C)] 97.6 F (36.4 C) (10/21 1356) Pulse Rate:  [91-113] 91 (10/22 0901) Resp:  [10-35] 13 (10/22 0901) BP: (69-142)/(32-83) 111/63 (10/22 0901) SpO2:  [94 %-100 %] 100 % (10/22 0901) Weight:  [84.4 kg] 84.4 kg (10/22 0901)    Intake/Output from previous day: 10/21 0701 - 10/22 0700 In: 1085.7 [I.V.:985.7; IV Piggyback:100] Out: 1200 [Emesis/NG output:450] Intake/Output this shift: Total I/O In: -  Out: 2100 [Urine:2100]  PE: General: pleasant, WD, obese female who appears uncomfortable Heart: regular, rate, and rhythm.  Normal s1,s2. No obvious murmurs, gallops, or rubs noted.  Palpable radial and pedal pulses bilaterally Lungs: bilateral wheezing with junky cough.  Respiratory effort nonlabored Abd: soft, mildly ttp in upper abdomen, no peritonitis, ND, +BS, NGT with minimal bloody drainage     Lab Results:  Recent Labs    02/26/20 2017 02/27/20 0243  WBC 10.7* 8.2  HGB 10.6* 9.4*  HCT 32.6* 29.1*  PLT 292 266   BMET Recent Labs    02/26/20 2017 02/27/20 0243  NA 135 136  K 4.3 4.5  CL 104 106  CO2 19* 21*  GLUCOSE 149* 130*  BUN 31* 27*  CREATININE 1.84* 1.48*  CALCIUM 8.5* 8.4*   PT/INR Recent Labs    02/26/20 1506  LABPROT 12.8  INR 1.0   CMP     Component Value Date/Time   NA 136 02/27/2020 0243   NA 138 07/02/2015 1445   K 4.5 02/27/2020 0243   CL 106 02/27/2020 0243   CO2 21 (L) 02/27/2020 0243   GLUCOSE 130 (H) 02/27/2020 0243   BUN 27 (H) 02/27/2020 0243   BUN 10 07/02/2015 1445   CREATININE 1.48 (H) 02/27/2020 0243   CREATININE 1.00 12/02/2014 1323   CALCIUM 8.4 (L) 02/27/2020 0243   PROT 5.7 (L) 02/27/2020 0243    ALBUMIN 3.2 (L) 02/27/2020 0243   AST 17 02/27/2020 0243   ALT 10 02/27/2020 0243   ALKPHOS 58 02/27/2020 0243   BILITOT 0.5 02/27/2020 0243   GFRNONAA 40 (L) 02/27/2020 0243   GFRNONAA 62 12/02/2014 1323   GFRAA >60 11/27/2017 0602   GFRAA 72 12/02/2014 1323   Lipase     Component Value Date/Time   LIPASE 36 02/26/2020 1506       Studies/Results: CT Abdomen Pelvis Wo Contrast  Result Date: 02/26/2020 CLINICAL DATA:  Abdominal pain. EXAM: CT ABDOMEN AND PELVIS WITHOUT CONTRAST TECHNIQUE: Multidetector CT imaging of the abdomen and pelvis was performed following the standard protocol without IV contrast. COMPARISON:  November 27, 2017 FINDINGS: Lower chest: No acute abnormality. Hepatobiliary: No focal liver abnormality is seen. No gallstones, gallbladder wall thickening, or biliary dilatation. Pancreas: Unremarkable. No pancreatic ductal dilatation or surrounding inflammatory changes. Spleen: Normal in size without focal abnormality. Adrenals/Urinary Tract: Adrenal glands are unremarkable. Kidneys are normal in size, without renal calculi or hydronephrosis. Stable, adjacent, 2.1 cm x 2.0 cm and 1.5 cm x 1.2 cm exophytic left renal cysts are seen. Bladder is unremarkable. Stomach/Bowel: There is a small hiatal hernia. Appendix appears normal. Mildly dilated small bowel loops are seen within the lower abdomen (maximum small bowel diameter of approximately 3.0 cm). A clear transition zone  is not identified. Noninflamed diverticula are seen within the sigmoid colon. Vascular/Lymphatic: There is marked severity calcification of the common iliac arteries, without evidence of aneurysmal dilatation. No enlarged abdominal or pelvic lymph nodes. Reproductive: Status post hysterectomy. No adnexal masses. Other: No abdominal wall hernia or abnormality. No abdominopelvic ascites. Musculoskeletal: Degenerative changes seen within the lumbar spine, most prominent at the level of L5-S1. IMPRESSION: 1. Findings  consistent with a mild ileus versus distal partial small bowel obstruction. 2. Sigmoid diverticulosis. 3. Small hiatal hernia. 4. Stable left renal cysts. 5. Aortic atherosclerosis. Aortic Atherosclerosis (ICD10-I70.0). Electronically Signed   By: Virgina Norfolk M.D.   On: 02/26/2020 15:30   DG Chest Port 1 View  Result Date: 02/26/2020 CLINICAL DATA:  Shortness of breath. EXAM: PORTABLE CHEST 1 VIEW COMPARISON:  November 27, 2017. FINDINGS: The heart size and mediastinal contours are within normal limits. No pneumothorax or pleural effusion is noted. Minimal bibasilar subsegmental atelectasis or scarring is noted. The visualized skeletal structures are unremarkable. IMPRESSION: Minimal bibasilar subsegmental atelectasis or scarring. Aortic Atherosclerosis (ICD10-I70.0). Electronically Signed   By: Marijo Conception M.D.   On: 02/26/2020 15:18   US Abdomen Limited RUQ (LIVER/GB)  Result Date: 02/26/2020 CLINICAL DATA:  Right upper quadrant pain EXAM: ULTRASOUND ABDOMEN LIMITED RIGHT UPPER QUADRANT COMPARISON:  CT 02/26/2020, ultrasound 01/08/2018 FINDINGS: Gallbladder: No gallstones or wall thickening visualized. No sonographic Murphy sign noted by sonographer. Common bile duct: Diameter: 3 mm Liver: No focal lesion identified. Within normal limits in parenchymal echogenicity. Portal vein is patent on color Doppler imaging with normal direction of blood flow towards the liver. Other: None. IMPRESSION: Negative right upper quadrant abdominal ultrasound Electronically Signed   By: Donavan Foil M.D.   On: 02/26/2020 17:47    Anti-infectives: Anti-infectives (From admission, onward)   Start     Dose/Rate Route Frequency Ordered Stop   02/27/20 1600  ceFEPIme (MAXIPIME) 2 g in sodium chloride 0.9 % 100 mL IVPB  Status:  Discontinued        2 g 200 mL/hr over 30 Minutes Intravenous Every 24 hours 02/26/20 1749 02/27/20 0828   02/27/20 0400  ceFEPIme (MAXIPIME) 2 g in sodium chloride 0.9 % 100 mL IVPB   Status:  Discontinued        2 g 200 mL/hr over 30 Minutes Intravenous Every 12 hours 02/26/20 1719 02/26/20 1749   02/26/20 1615  ceFEPIme (MAXIPIME) 2 g in sodium chloride 0.9 % 100 mL IVPB        2 g 200 mL/hr over 30 Minutes Intravenous  Once 02/26/20 1603 02/26/20 1847   02/26/20 1615  metroNIDAZOLE (FLAGYL) IVPB 500 mg        500 mg 100 mL/hr over 60 Minutes Intravenous  Once 02/26/20 1603 02/26/20 1742       Assessment/Plan Hx HTN Abnormal UA - UA w/ large leukocytes, urine culture pending  AKI - improving Lactic acidosis - lactic acid 2.1 yesterday and now 1.2, normalized with IVF Dental pain - consider dental block  Obesity - BMI 34.02  Abdominal pain  Possible UGI bleeding - consider GI consult for possible UGI bleeding secondary to PUD - pt has had some hematemesis and reports pain is similar to prior gastritis, also smokes and taking NSAID for arthritis  - hgb 9.4 from 10.6 - Continue BID PPI - sent H. Pylori as well  Ileus vs SBO on non-contrast CT study - patient passing flatus, minimal NGT output and is not bilious, abd soft -  ok to clamp NGT and possibly remove later today - No indication for emergency surgery at this time  FEN: NPO, IVF, NGT clamped VTE: SQ heparin  ID: cefepime/flagyl 10/21   LOS: 1 day    Norm Parcel , [facility] 02/27/2020, 10:00 AM Please see Amion for pager number during day hours 7:00am-4:30pm

## 2020-02-27 NOTE — Consult Note (Addendum)
Referring Provider:  [facility]         Primary Care Physician:  Alvester Chou, NP Primary Gastroenterologist:  Oretha Caprice, MD                We were asked to see this patient for:  hematemesis                ASSESSMENT / PLAN:   64 y.o. female with a pmh significant for, not necessarily limited to: Hypertension, obesity, depression, arthritis, cocaine use, GERD, H. pylori related gastritis, adenomatous colon polyps   # Acute vomiting / hematemesis in setting of a 3 month history of postprandial upper abdominal burning, nausea and recent breakthrough GERD symptoms on BID PPI.  She has a history of H. pylori gastritis in 2018 treated with Pylera . Patient presented to ED yesterday hypotensive and with initial hgb of 5.8. I think she was hypotensive from volume depletion. No evidence for hemorrhagic shock and repeat Hgb was actually 12 ( without transfusion). Hematemesis seems low volume overall and she hasn't had any melena ( light brown stool on exam). Rule out erosive esophagitis, Mallory-Weiss tear, PUD.  --Patient will need EGD, most likely tomorrow. The risks and benefits of EGD were discussed and the patient agrees to proceed.  --Supportive care today with IVF, anti-emetics.  Her blood pressures have stabilized, she did required Levophed.  --Even though hematemesis is low volume recommend holding the SQ heparin --I discontinued H.pylori IgG antibody test it would likely be positive. She had H.pylori gastritis in 2018.   # Distal partial SBO vrs ileus on non-contrast CT scan.  This may be reason for onset on vomiting ( though she has been having postprandial nausea for a few months).   --Dr. Ardis Hughs reviewed CT scan. We have removed her NGT which is not currently connected to suction.   # Weight loss, recent 20 pound loss per patient.  Initially was trying to lose but majority of weight loss was unintentional.  --When renal function allows, consider repeating CT scan with contrast  to evaluate weight loss, re-evaluate above CT scan findings and further evaluate upper abdominal pain ( if EGD negative).     # Macrocytic anemia.  --Presenting hgb 5.8, obviously erroneous. Baseline hgb mid 11 range, down to 9.4 now after IVF and low volume hematemesis   # AKI, Creatinine 2.53 in ED, down to 1.48 today.    # GERD, recent breakthrough symptoms on BID PPI.  --Hiccups and pyrosis. Was controlled on BID PPI until a couple of months ago --Further evaluation at time of EGD .   # History of adenomatous colon polyps --4 mm tubular adenoma removed 2018     HPI:                                                                                                                             Chief Complaint: abdominal pain , vomiting,   Lori [name]  [name] is a 64 y.o. female with a pmh significant for, not necessarily limited to: Hypertension, obesity, depression, arthritis, cocaine use, GERD, H. pylori related gastritis, adenomatous colon polyps, pancreatitis  Patient known to [place] from August 26, 2016 for evaluation of upper abdominal pain, bloating and constipation .  She underwent EGD and colonoscopy with findings of H. pylori related gastritis and a small adenomatous colon polyps.    Patient describes a 76-month history of postprandial epigastric burning and nausea.  Over the last few weeks she has been having breakthrough pyrosis and regurgitation on twice daily PPI.  Yesterday patient had several episodes of vomiting which was subsequently followed by onset of hematemesis (light red followed by dark red blood) she came to the emergency department and was hypotensive on arrival.  She was in AKI.  Lipase and LFTs normal.  Her initial hemoglobin was in the 5 range but on repeat it was actually 12.  Noncontrast CT scan suggested partial small bowel obstruction versus ileus. Patient has not had any blood in her stool nor melena.  She denies NSAID use, takes Tylenol as needed for pain.  She is not on  blood thinners.  Patient says she is compliant with twice daily pantoprazole    PREVIOUS ENDOSCOPIC EVALUATIONS / PERTINENT STUDIES    October 2018 colonoscopy --4 mm tubular adenoma  October 2018 EGD for epigastric pain --Gastritis.  Positive H. pylori on biopsy.   Past Medical History:  Diagnosis Date  . Arthritis   . Depression   . Depression with anxiety 2008-08-26   Patient had multiple death in her family over a short period of time. father, brother, uncle and niece who was stabbed 3 years ago. Patient's husband had a car accident last year and  need currently a feeding tube.   Marland Kitchen GERD (gastroesophageal reflux disease)   . Gout   . Headache(784.0)    migraines  . History of cervical cancer  1985    status post partial hysterectomy , last Pap smear 10 years ago , no further followup  . History of cocaine abuse ([place]) 10/23/2008    last documented in 26-Aug-2005 when admitted for hypertensive urgency  . Hypertension 08/26/2005   Admitted for HTN crisis in 10/2008. Was given in 26-Aug-2008 a wrong prescription from the pharmacy and per ED note it was Lisinopril-Hydrocholorthizide which gave her a  hives and feeling of sickness. Patient was started  on this meds on 05/31/2010 by Dr  Ihor Gully without any problem.   . Insomnia   . Tobacco abuse     35 years  . [facility]     Past Surgical History:  Procedure Laterality Date  . CESAREAN SECTION  1985  . COLONOSCOPY WITH PROPOFOL N/A 03/01/2017   Procedure: COLONOSCOPY WITH PROPOFOL;  Surgeon: Milus Banister, MD;  Location: [facility] ENDOSCOPY;  Service: Endoscopy;  Laterality: N/A;  . ESOPHAGOGASTRODUODENOSCOPY (EGD) WITH PROPOFOL N/A 03/01/2017   Procedure: ESOPHAGOGASTRODUODENOSCOPY (EGD) WITH PROPOFOL;  Surgeon: Milus Banister, MD;  Location: [facility] ENDOSCOPY;  Service: Endoscopy;  Laterality: N/A;  . PARTIAL HYSTERECTOMY    . SKIN GRAFT      Prior to Admission medications   Medication Sig Start Date End Date Taking? Authorizing Provider    acetaminophen (TYLENOL) 500 MG tablet Take 1,000-2,000 mg by mouth every 6 (six) hours as needed for headache.   Yes [provider]  albuterol (VENTOLIN HFA) 108 (90 Base) MCG/ACT inhaler Inhale 1-2 puffs into the lungs every 6 (six) hours as  needed for wheezing or shortness of breath. 11/03/19  Yes Darr, Marguerita Beards, PA-C  ALPRAZolam Duanne Moron) 0.5 MG tablet Take 0.5 mg by mouth in the morning, at noon, in the evening, and at bedtime.   Yes [provider]  diphenhydramine-acetaminophen (TYLENOL PM) 25-500 MG TABS tablet Take 2-4 tablets by mouth at bedtime as needed (headaches).    Yes [provider]  gabapentin (NEURONTIN) 300 MG capsule TAKE 1 CAPSULE BY MOUTH EVERYDAY AT BEDTIME Patient taking differently: Take 300 mg by mouth 3 (three) times daily.  11/09/17  Yes Newt Minion, MD  lisinopril (PRINIVIL,ZESTRIL) 20 MG tablet TAKE 1 TABLET (20 MG TOTAL) BY MOUTH DAILY. 07/07/16  Yes Burgess Estelle, MD  QUEtiapine (SEROQUEL) 25 MG tablet Take 25 mg by mouth at bedtime.  12/15/16  Yes [provider]  zolpidem (AMBIEN) 5 MG tablet Take 5 mg by mouth at bedtime.  12/06/16  Yes [provider]  acetaminophen (TYLENOL) 325 MG tablet Take 2 tablets (650 mg total) by mouth every 6 (six) hours as needed. Patient not taking: Reported on 02/26/2020 11/03/19   Darr, Marguerita Beards, PA-C  Colchicine 0.6 MG CAPS Take 1 capsule by mouth daily as needed. Patient not taking: Reported on 02/26/2020 05/30/17   Suzan Slick, NP  diclofenac Sodium (VOLTAREN) 1 % GEL Apply 4 g topically 4 (four) times daily. Patient not taking: Reported on 02/26/2020 11/03/19   Darr, Marguerita Beards, PA-C  ondansetron (ZOFRAN) 4 MG tablet Take 1 tablet (4 mg total) by mouth every 6 (six) hours as needed for nausea. Patient not taking: Reported on 02/26/2020 09/12/16   Janece Canterbury, MD  pantoprazole (PROTONIX) 40 MG tablet Take 1 tablet (40 mg total) by mouth 2 (two) times daily. Patient taking differently: Take  40 mg by mouth daily.  03/05/17 11/27/17  Milus Banister, MD  sucralfate (CARAFATE) 1 g tablet Take 1 tablet (1 g total) by mouth 4 (four) times daily -  with meals and at bedtime. Patient not taking: Reported on 02/26/2020 11/27/17   Domenic Moras, PA-C  sucralfate (CARAFATE) 1 GM/10ML suspension Take 1 gram ( 10 ml) between meals and at bedtime. Patient not taking: Reported on 02/26/2020 12/27/17   Esterwood, Amy S, PA-C  traMADol (ULTRAM) 50 MG tablet Take 1 tablet by mouth every 6 hours as needed for pain. Patient not taking: Reported on 02/26/2020 12/27/17   Alfredia Ferguson, PA-C    Current Facility-Administered Medications  Medication Dose Route Frequency Provider Last Rate Last Admin  . heparin injection 5,000 Units  5,000 Units Subcutaneous Q8H Madalyn Rob, MD   5,000 Units at 02/27/20 0556  . ondansetron (ZOFRAN) injection 4 mg  4 mg Intravenous Q6H PRN Madalyn Rob, MD   4 mg at 02/27/20 1024  . pantoprazole (PROTONIX) injection 40 mg  40 mg Intravenous Q12H Damita Dunnings B, MD   40 mg at 02/27/20 1026   Current Outpatient Medications  Medication Sig Dispense Refill  . acetaminophen (TYLENOL) 500 MG tablet Take 1,000-2,000 mg by mouth every 6 (six) hours as needed for headache.    . albuterol (VENTOLIN HFA) 108 (90 Base) MCG/ACT inhaler Inhale 1-2 puffs into the lungs every 6 (six) hours as needed for wheezing or shortness of breath. 8 g 0  . ALPRAZolam (XANAX) 0.5 MG tablet Take 0.5 mg by mouth in the morning, at noon, in the evening, and at bedtime.    . diphenhydramine-acetaminophen (TYLENOL PM) 25-500 MG TABS tablet Take 2-4 tablets  by mouth at bedtime as needed (headaches).     . gabapentin (NEURONTIN) 300 MG capsule TAKE 1 CAPSULE BY MOUTH EVERYDAY AT BEDTIME (Patient taking differently: Take 300 mg by mouth 3 (three) times daily. ) 90 capsule 0  . lisinopril (PRINIVIL,ZESTRIL) 20 MG tablet TAKE 1 TABLET (20 MG TOTAL) BY MOUTH DAILY. 30 tablet 0  . QUEtiapine (SEROQUEL) 25 MG  tablet Take 25 mg by mouth at bedtime.   1  . zolpidem (AMBIEN) 5 MG tablet Take 5 mg by mouth at bedtime.   5  . acetaminophen (TYLENOL) 325 MG tablet Take 2 tablets (650 mg total) by mouth every 6 (six) hours as needed. (Patient not taking: Reported on 02/26/2020) 30 tablet 0  . Colchicine 0.6 MG CAPS Take 1 capsule by mouth daily as needed. (Patient not taking: Reported on 02/26/2020) 30 capsule 2  . diclofenac Sodium (VOLTAREN) 1 % GEL Apply 4 g topically 4 (four) times daily. (Patient not taking: Reported on 02/26/2020) 100 g 0  . ondansetron (ZOFRAN) 4 MG tablet Take 1 tablet (4 mg total) by mouth every 6 (six) hours as needed for nausea. (Patient not taking: Reported on 02/26/2020) 20 tablet 0  . pantoprazole (PROTONIX) 40 MG tablet Take 1 tablet (40 mg total) by mouth 2 (two) times daily. (Patient taking differently: Take 40 mg by mouth daily. ) 60 tablet 6  . sucralfate (CARAFATE) 1 g tablet Take 1 tablet (1 g total) by mouth 4 (four) times daily -  with meals and at bedtime. (Patient not taking: Reported on 02/26/2020) 15 tablet 0  . sucralfate (CARAFATE) 1 GM/10ML suspension Take 1 gram ( 10 ml) between meals and at bedtime. (Patient not taking: Reported on 02/26/2020) 420 mL 3  . traMADol (ULTRAM) 50 MG tablet Take 1 tablet by mouth every 6 hours as needed for pain. (Patient not taking: Reported on 02/26/2020) 25 tablet 0    Allergies as of 02/26/2020  . (No Known Allergies)    Family History  Problem Relation Age of Onset  . Thyroid disease Mother   . Diabetes Father   . Heart disease Father   . Hyperlipidemia Father   . Hypertension Father   . Diabetes Brother   . Hypertension Brother   . Hyperlipidemia Brother   . Colon polyps Brother   . Thyroid disease Daughter   . Obesity Daughter   . Mental illness Daughter   . Colon cancer Maternal Uncle   . Stomach cancer Neg Hx   . Pancreatic cancer Neg Hx     Social History   Socioeconomic History  . Marital status: Single     Spouse name: Not on file  . Number of children: 2  . Years of education: Not on file  . Highest education level: Not on file  Occupational History  . Not on file  Tobacco Use  . Smoking status: Light Tobacco Smoker    Packs/day: 0.50    Years: 43.00    Pack years: 21.50    Last attempt to quit: 09/05/2016    Years since quitting: 3.4  . Smokeless tobacco: Never Used  . Tobacco comment: patches have helped in the past  Vaping Use  . Vaping Use: Never used  Substance and Sexual Activity  . Alcohol use: No  . Drug use: No  . Sexual activity: Not Currently    Partners: Male  Other Topics Concern  . Not on file  Social History Narrative   Patient had multiple death  in her family over a short period of time. father, brother, uncle and niece who was stabbed 3 years ago. Patient's husband had a car accident last year and  need currently a feeding tube.          Social Determinants of Health   Financial Resource Strain:   . Difficulty of Paying Living Expenses: Not on file  Food Insecurity:   . Worried About Charity fundraiser in the Last Year: Not on file  . Ran Out of Food in the Last Year: Not on file  Transportation Needs:   . Lack of Transportation (Medical): Not on file  . Lack of Transportation (Non-Medical): Not on file  Physical Activity:   . Days of Exercise per Week: Not on file  . Minutes of Exercise per Session: Not on file  Stress:   . Feeling of Stress : Not on file  Social Connections:   . Frequency of Communication with Friends and Family: Not on file  . Frequency of Social Gatherings with Friends and Family: Not on file  . Attends Religious Services: Not on file  . Active Member of Clubs or Organizations: Not on file  . Attends Archivist Meetings: Not on file  . Marital Status: Not on file  Intimate Partner Violence:   . Fear of Current or Ex-Partner: Not on file  . Emotionally Abused: Not on file  . Physically Abused: Not on file  .  Sexually Abused: Not on file    Review of Systems: All systems reviewed and negative except where noted in HPI.   OBJECTIVE:    Physical Exam: Vital signs in last 24 hours: Temp:  [97.6 F (36.4 C)] 97.6 F (36.4 C) (10/21 1356) Pulse Rate:  [91-113] 100 (10/22 1115) Resp:  [10-35] 14 (10/22 1115) BP: (69-142)/(32-83) 124/58 (10/22 1115) SpO2:  [94 %-100 %] 95 % (10/22 1115) Weight:  [84.4 kg] 84.4 kg (10/22 0901)   General:   Alert, obese female in NAD Psych:  Pleasant, cooperative. Normal mood and affect. Eyes:  Pupils equal, sclera clear, no icterus.   Conjunctiva pink. Ears:  Normal auditory acuity. Nose:  No deformity, discharge,  or lesions. Neck:  Supple; no masses Lungs:  A few scattered expiratory wheezes.  Heart:  Regular rate and rhythm;, no lower extremity edema Abdomen:  Soft, non-distended, mild epigastric tenderness.. BS active, no palp mass   Rectal:  Light brown soft stool in vault.  Msk:  Symmetrical without gross deformities. . Neurologic:  Alert and  oriented x4;  grossly normal neurologically. Skin:  Intact without significant lesions or rashes.  Filed Weights   02/26/20 1648 02/27/20 0901  Weight: 84.4 kg 84.4 kg     Scheduled inpatient medications . heparin  5,000 Units Subcutaneous Q8H  . pantoprazole (PROTONIX) IV  40 mg Intravenous Q12H      Intake/Output from previous day: 10/21 0701 - 10/22 0700 In: 1085.7 [I.V.:985.7; IV Piggyback:100] Out: 1200 [Emesis/NG output:450] Intake/Output this shift: Total I/O In: -  Out: 2100 [Urine:2100]   Lab Results: Recent Labs    02/26/20 1506 02/26/20 2017 02/27/20 0243  WBC 10.2 10.7* 8.2  HGB 11.6* 10.6* 9.4*  HCT 35.9* 32.6* 29.1*  PLT 320 292 266   BMET Recent Labs    02/26/20 1506 02/26/20 2017 02/27/20 0243  NA 133* 135 136  K 4.3 4.3 4.5  CL 98 104 106  CO2 20* 19* 21*  GLUCOSE 142* 149* 130*  BUN 31* 31* 27*  CREATININE 2.53* 1.84* 1.48*  CALCIUM 9.9 8.5* 8.4*    LFT Recent Labs    02/27/20 0243  PROT 5.7*  ALBUMIN 3.2*  AST 17  ALT 10  ALKPHOS 58  BILITOT 0.5   PT/INR Recent Labs    02/26/20 1506  LABPROT 12.8  INR 1.0   Hepatitis Panel No results for input(s): HEPBSAG, HCVAB, HEPAIGM, HEPBIGM in the last 72 hours.   . CBC Latest Ref Rng & Units 02/27/2020 02/26/2020 02/26/2020  WBC 4.0 - 10.5 K/uL 8.2 10.7(H) 10.2  Hemoglobin 12.0 - 15.0 g/dL 9.4(L) 10.6(L) 11.6(L)  Hematocrit 36 - 46 % 29.1(L) 32.6(L) 35.9(L)  Platelets 150 - 400 K/uL 266 292 320    . CMP Latest Ref Rng & Units 02/27/2020 02/26/2020 02/26/2020  Glucose 70 - 99 mg/dL 130(H) 149(H) 142(H)  BUN 8 - 23 mg/dL 27(H) 31(H) 31(H)  Creatinine 0.44 - 1.00 mg/dL 1.48(H) 1.84(H) 2.53(H)  Sodium 135 - 145 mmol/L 136 135 133(L)  Potassium 3.5 - 5.1 mmol/L 4.5 4.3 4.3  Chloride 98 - 111 mmol/L 106 104 98  CO2 22 - 32 mmol/L 21(L) 19(L) 20(L)  Calcium 8.9 - 10.3 mg/dL 8.4(L) 8.5(L) 9.9  Total Protein 6.5 - 8.1 g/dL 5.7(L) 6.1(L) 7.1  Total Bilirubin 0.3 - 1.2 mg/dL 0.5 0.6 0.7  Alkaline Phos 38 - 126 U/L 58 65 81  AST 15 - 41 U/L 17 21 21   ALT 0 - 44 U/L 10 12 13    Studies/Results: CT Abdomen Pelvis Wo Contrast  Result Date: 02/26/2020 CLINICAL DATA:  Abdominal pain. EXAM: CT ABDOMEN AND PELVIS WITHOUT CONTRAST TECHNIQUE: Multidetector CT imaging of the abdomen and pelvis was performed following the standard protocol without IV contrast. COMPARISON:  November 27, 2017 FINDINGS: Lower chest: No acute abnormality. Hepatobiliary: No focal liver abnormality is seen. No gallstones, gallbladder wall thickening, or biliary dilatation. Pancreas: Unremarkable. No pancreatic ductal dilatation or surrounding inflammatory changes. Spleen: Normal in size without focal abnormality. Adrenals/Urinary Tract: Adrenal glands are unremarkable. Kidneys are normal in size, without renal calculi or hydronephrosis. Stable, adjacent, 2.1 cm x 2.0 cm and 1.5 cm x 1.2 cm exophytic left renal cysts  are seen. Bladder is unremarkable. Stomach/Bowel: There is a small hiatal hernia. Appendix appears normal. Mildly dilated small bowel loops are seen within the lower abdomen (maximum small bowel diameter of approximately 3.0 cm). A clear transition zone is not identified. Noninflamed diverticula are seen within the sigmoid colon. Vascular/Lymphatic: There is marked severity calcification of the common iliac arteries, without evidence of aneurysmal dilatation. No enlarged abdominal or pelvic lymph nodes. Reproductive: Status post hysterectomy. No adnexal masses. Other: No abdominal wall hernia or abnormality. No abdominopelvic ascites. Musculoskeletal: Degenerative changes seen within the lumbar spine, most prominent at the level of L5-S1. IMPRESSION: 1. Findings consistent with a mild ileus versus distal partial small bowel obstruction. 2. Sigmoid diverticulosis. 3. Small hiatal hernia. 4. Stable left renal cysts. 5. Aortic atherosclerosis. Aortic Atherosclerosis (ICD10-I70.0). Electronically Signed   By: Virgina Norfolk M.D.   On: 02/26/2020 15:30   DG Chest Port 1 View  Result Date: 02/26/2020 CLINICAL DATA:  Shortness of breath. EXAM: PORTABLE CHEST 1 VIEW COMPARISON:  November 27, 2017. FINDINGS: The heart size and mediastinal contours are within normal limits. No pneumothorax or pleural effusion is noted. Minimal bibasilar subsegmental atelectasis or scarring is noted. The visualized skeletal structures are unremarkable. IMPRESSION: Minimal bibasilar subsegmental atelectasis or scarring. Aortic Atherosclerosis (ICD10-I70.0). Electronically Signed   By: Bobbe Medico.D.  On: 02/26/2020 15:18   US Abdomen Limited RUQ (LIVER/GB)  Result Date: 02/26/2020 CLINICAL DATA:  Right upper quadrant pain EXAM: ULTRASOUND ABDOMEN LIMITED RIGHT UPPER QUADRANT COMPARISON:  CT 02/26/2020, ultrasound 01/08/2018 FINDINGS: Gallbladder: No gallstones or wall thickening visualized. No sonographic Murphy sign noted by  sonographer. Common bile duct: Diameter: 3 mm Liver: No focal lesion identified. Within normal limits in parenchymal echogenicity. Portal vein is patent on color Doppler imaging with normal direction of blood flow towards the liver. Other: None. IMPRESSION: Negative right upper quadrant abdominal ultrasound Electronically Signed   By: Donavan Foil M.D.   On: 02/26/2020 17:47    Tye Savoy, NP-C @  02/27/2020, 11:39 AM   ________________________________________________________________________  Velora Heckler GI MD note:  I personally examined the patient, reviewed the data and agree with the assessment and plan described above.  64 year old woman with abdominal pain, vomiting in the setting of 3 months of weight loss, abdominal discomfort after eating, nausea.  Non IV contrast CT scan suggested partial small bowel obstruction versus ileus however I am not really sure that either of those are actually a problem for her.  I reviewed the imaging myself and the small bowel findings are quite underwhelming to me.  I have more concern for upper GI, gastric or duodenal process.  Indeed she had H. pylori positive gastritis 3 years ago and perhaps it was never eradicated after Pylera antibiotic treatment.  Her mid to distal duodenum on my review looks a bit unusual on CT scan as well.  I think she needs upper endoscopic evaluation and if that is not helpful then better quality IV contrast CT scan abdomen pelvis.  We will keep her on proton pump inhibitors in the meantime, IV fluid hydration.   Owens Loffler, MD [facility] Pager [phone]

## 2020-02-27 NOTE — Progress Notes (Addendum)
Subjective: The patient reports filling better this AM. She reports less emesis and less abdominal pain. Patient does report increase cough since she successfully had NG tube placed.   Objective:  Vital signs in last 24 hours: Vitals:   02/27/20 1315 02/27/20 1345 02/27/20 1400 02/27/20 1415  BP: 106/66 139/67 130/73 135/63  Pulse: (!) 106 (!) 103 (!) 101 (!) 105  Resp: 11 14 15 16   SpO2: 97% 96% 97% 97%  Weight:      Height:       Physical Exam Constitutional:      Appearance: She is well-developed. She is obese.     Comments: NG tube placed  HENT:     Head: Normocephalic and atraumatic.  Cardiovascular:     Rate and Rhythm: Normal rate and regular rhythm.  Pulmonary:     Effort: Pulmonary effort is normal.     Breath sounds: Normal breath sounds.  Abdominal:     General: Abdomen is flat. Bowel sounds are normal.     Comments: RUQ and epigastric tenderness.   Skin:    General: Skin is warm and dry.  Neurological:     Mental Status: She is alert.     Assessment/Plan:  Principal Problem:   Hematemesis with nausea Active Problems:   AKI (acute kidney injury) ([place])   Shock ([facility])   Epigastric pain  [name] is a 89 yowith PMH of GERD, HTN, Depression, Arthritis, cocaine abuse who presents with abdominal pain and hematemesis.  Abdominal pain associated w/ Hematemesis Patient reports improvement in her abdominal pain this AM. Patient is having decrease hematemesis. GI saw patient this AM and remain concerned for possible gastric vs duodenal ulcers and H pylori. Per GI patient had H pylori 3 years ago. GI has discontinued H Pylori IgG ab test due to this. - IV Protonix 40mg  BID - EGD AM - NPO -continue mIVF LR @100ml /h - GI consult appreciate further recc's - Robitussin  Ileus vs SBO? Decreased concern for SBO. Patient report recent BM and flatulence. GI not concerned for SBO. Ordered NG tube be removed. - Discontinue NG tube - mIVF LR @  183ml/h -NPO - AM CBC - Discontinued abx  Macrocytic anemia Patient noted to have MCV 100.7. B12 379 and Folate 10.1. Patient clinical presentation suggest she is at increased risk for intrinsic factor dysfunction promoting macrocytic anemia.  - Consider Intrinsic factor ab pending EGD results.  Hypotensive shock- resolved Patient presented with signs of hypotensive shock. Patient received Levophed and 3L fluid resuscitation and responded well. Patient BP at time of admission by IMTS 112/53. PCCM has Bcx in the case that sepsis contributed to shock. Patient LA 2.1>1.4>1.2 after fluids.  - Discontinue cefepime and flagyl -f/u Bcx ngtd  Mood disorder Patient has depression and takes Seroquel 25 QHS, Ambien 5mg   and Xanax 0.5mg  QID. Home medication list suggest patient has significant anxiety disorder.  - Will restart when patient is not NPO  HTN Patient has hx of hypertension. Patient presented hypotensive and required levophed at presentation to ED. Home medication is lisinopril 20mg  - Will hold at this time.  AKI Patient presented with renal failure in the setting of hypovolemic shock. Baseline Cr appears to be around 1.0. Cr 2.5>2.53>1.48. BUN 33>31>27. Hyponatremia has resolved.  - continue mIVF LR @ 120ml/h -AM BMP  Prior to Admission Living Arrangement: Home Anticipated Discharge Location: Home Barriers to Discharge: Treatment Dispo: Anticipated discharge in approximately 1-2 day(s).   Lori Busman, MD  02/27/2020, 2:35 PM Pager: [phone] After 5pm on weekdays and 1pm on weekends: On Call pager [phone]

## 2020-02-27 NOTE — H&P (View-Only) (Signed)
Referring Provider:  [facility]         Primary Care Physician:  Alvester Chou, NP Primary Gastroenterologist:  Oretha Caprice, MD                We were asked to see this patient for:  hematemesis                ASSESSMENT / PLAN:   64 y.o. female with a pmh significant for, not necessarily limited to: Hypertension, obesity, depression, arthritis, cocaine use, GERD, H. pylori related gastritis, adenomatous colon polyps   # Acute vomiting / hematemesis in setting of a 3 month history of postprandial upper abdominal burning, nausea and recent breakthrough GERD symptoms on BID PPI.  She has a history of H. pylori gastritis in 2018 treated with Pylera . Patient presented to ED yesterday hypotensive and with initial hgb of 5.8. I think she was hypotensive from volume depletion. No evidence for hemorrhagic shock and repeat Hgb was actually 12 ( without transfusion). Hematemesis seems low volume overall and she hasn't had any melena ( light brown stool on exam). Rule out erosive esophagitis, Mallory-Weiss tear, PUD.  --Patient will need EGD, most likely tomorrow. The risks and benefits of EGD were discussed and the patient agrees to proceed.  --Supportive care today with IVF, anti-emetics.  Her blood pressures have stabilized, she did required Levophed.  --Even though hematemesis is low volume recommend holding the SQ heparin --I discontinued H.pylori IgG antibody test it would likely be positive. She had H.pylori gastritis in 2018.   # Distal partial SBO vrs ileus on non-contrast CT scan.  This may be reason for onset on vomiting ( though she has been having postprandial nausea for a few months).   --Dr. Ardis Hughs reviewed CT scan. We have removed her NGT which is not currently connected to suction.   # Weight loss, recent 20 pound loss per patient.  Initially was trying to lose but majority of weight loss was unintentional.  --When renal function allows, consider repeating CT scan with contrast  to evaluate weight loss, re-evaluate above CT scan findings and further evaluate upper abdominal pain ( if EGD negative).     # Macrocytic anemia.  --Presenting hgb 5.8, obviously erroneous. Baseline hgb mid 11 range, down to 9.4 now after IVF and low volume hematemesis   # AKI, Creatinine 2.53 in ED, down to 1.48 today.    # GERD, recent breakthrough symptoms on BID PPI.  --Hiccups and pyrosis. Was controlled on BID PPI until a couple of months ago --Further evaluation at time of EGD .   # History of adenomatous colon polyps --4 mm tubular adenoma removed 2018     HPI:                                                                                                                             Chief Complaint: abdominal pain , vomiting,   Lori [name]  Lori [name] is a 64 y.o. female with a pmh significant for, not necessarily limited to: Hypertension, obesity, depression, arthritis, cocaine use, GERD, H. pylori related gastritis, adenomatous colon polyps, pancreatitis  Patient known to [place] from 08-06-16 for evaluation of upper abdominal pain, bloating and constipation .  She underwent EGD and colonoscopy with findings of H. pylori related gastritis and a small adenomatous colon polyps.    Patient describes a 40-month history of postprandial epigastric burning and nausea.  Over the last few weeks she has been having breakthrough pyrosis and regurgitation on twice daily PPI.  Yesterday patient had several episodes of vomiting which was subsequently followed by onset of hematemesis (light red followed by dark red blood) she came to the emergency department and was hypotensive on arrival.  She was in AKI.  Lipase and LFTs normal.  Her initial hemoglobin was in the 5 range but on repeat it was actually 12.  Noncontrast CT scan suggested partial small bowel obstruction versus ileus. Patient has not had any blood in her stool nor melena.  She denies NSAID use, takes Tylenol as needed for pain.  She is not on  blood thinners.  Patient says she is compliant with twice daily pantoprazole    PREVIOUS ENDOSCOPIC EVALUATIONS / PERTINENT STUDIES    October 2018 colonoscopy --4 mm tubular adenoma  October 2018 EGD for epigastric pain --Gastritis.  Positive H. pylori on biopsy.   Past Medical History:  Diagnosis Date  . Arthritis   . Depression   . Depression with anxiety 08/06/2008   Patient had multiple death in her family over a short period of time. father, brother, uncle and niece who was stabbed 3 years ago. Patient's husband had a car accident last year and  need currently a feeding tube.   Marland Kitchen GERD (gastroesophageal reflux disease)   . Gout   . Headache(784.0)    migraines  . History of cervical cancer  1985    status post partial hysterectomy , last Pap smear 10 years ago , no further followup  . History of cocaine abuse ([place]) 10/23/2008    last documented in August 06, 2005 when admitted for hypertensive urgency  . Hypertension 08-06-2005   Admitted for HTN crisis in 10/2008. Was given in 08/06/08 a wrong prescription from the pharmacy and per ED note it was Lisinopril-Hydrocholorthizide which gave her a  hives and feeling of sickness. Patient was started  on this meds on 05/31/2010 by Dr  Ihor Gully without any problem.   . Insomnia   . Tobacco abuse     35 years  . Uterine cancer [facility])     Past Surgical History:  Procedure Laterality Date  . CESAREAN SECTION  1985  . COLONOSCOPY WITH PROPOFOL N/A 03/01/2017   Procedure: COLONOSCOPY WITH PROPOFOL;  Surgeon: Milus Banister, MD;  Location: [facility] ENDOSCOPY;  Service: Endoscopy;  Laterality: N/A;  . ESOPHAGOGASTRODUODENOSCOPY (EGD) WITH PROPOFOL N/A 03/01/2017   Procedure: ESOPHAGOGASTRODUODENOSCOPY (EGD) WITH PROPOFOL;  Surgeon: Milus Banister, MD;  Location: [facility] ENDOSCOPY;  Service: Endoscopy;  Laterality: N/A;  . PARTIAL HYSTERECTOMY    . SKIN GRAFT      Prior to Admission medications   Medication Sig Start Date End Date Taking? Authorizing Provider   acetaminophen (TYLENOL) 500 MG tablet Take 1,000-2,000 mg by mouth every 6 (six) hours as needed for headache.   Yes [provider]  albuterol (VENTOLIN HFA) 108 (90 Base) MCG/ACT inhaler Inhale 1-2 puffs into the lungs every 6 (six) hours as needed  for wheezing or shortness of breath. 11/03/19  Yes Darr, Marguerita Beards, PA-C  ALPRAZolam Duanne Moron) 0.5 MG tablet Take 0.5 mg by mouth in the morning, at noon, in the evening, and at bedtime.   Yes [provider]  diphenhydramine-acetaminophen (TYLENOL PM) 25-500 MG TABS tablet Take 2-4 tablets by mouth at bedtime as needed (headaches).    Yes [provider]  gabapentin (NEURONTIN) 300 MG capsule TAKE 1 CAPSULE BY MOUTH EVERYDAY AT BEDTIME Patient taking differently: Take 300 mg by mouth 3 (three) times daily.  11/09/17  Yes Newt Minion, MD  lisinopril (PRINIVIL,ZESTRIL) 20 MG tablet TAKE 1 TABLET (20 MG TOTAL) BY MOUTH DAILY. 07/07/16  Yes Burgess Estelle, MD  QUEtiapine (SEROQUEL) 25 MG tablet Take 25 mg by mouth at bedtime.  12/15/16  Yes [provider]  zolpidem (AMBIEN) 5 MG tablet Take 5 mg by mouth at bedtime.  12/06/16  Yes [provider]  acetaminophen (TYLENOL) 325 MG tablet Take 2 tablets (650 mg total) by mouth every 6 (six) hours as needed. Patient not taking: Reported on 02/26/2020 11/03/19   Darr, Marguerita Beards, PA-C  Colchicine 0.6 MG CAPS Take 1 capsule by mouth daily as needed. Patient not taking: Reported on 02/26/2020 05/30/17   Suzan Slick, NP  diclofenac Sodium (VOLTAREN) 1 % GEL Apply 4 g topically 4 (four) times daily. Patient not taking: Reported on 02/26/2020 11/03/19   Darr, Marguerita Beards, PA-C  ondansetron (ZOFRAN) 4 MG tablet Take 1 tablet (4 mg total) by mouth every 6 (six) hours as needed for nausea. Patient not taking: Reported on 02/26/2020 09/12/16   Janece Canterbury, MD  pantoprazole (PROTONIX) 40 MG tablet Take 1 tablet (40 mg total) by mouth 2 (two) times daily. Patient taking differently: Take  40 mg by mouth daily.  03/05/17 11/27/17  Milus Banister, MD  sucralfate (CARAFATE) 1 g tablet Take 1 tablet (1 g total) by mouth 4 (four) times daily -  with meals and at bedtime. Patient not taking: Reported on 02/26/2020 11/27/17   Domenic Moras, PA-C  sucralfate (CARAFATE) 1 GM/10ML suspension Take 1 gram ( 10 ml) between meals and at bedtime. Patient not taking: Reported on 02/26/2020 12/27/17   Esterwood, Amy S, PA-C  traMADol (ULTRAM) 50 MG tablet Take 1 tablet by mouth every 6 hours as needed for pain. Patient not taking: Reported on 02/26/2020 12/27/17   Alfredia Ferguson, PA-C    Current Facility-Administered Medications  Medication Dose Route Frequency Provider Last Rate Last Admin  . heparin injection 5,000 Units  5,000 Units Subcutaneous Q8H Madalyn Rob, MD   5,000 Units at 02/27/20 0556  . ondansetron (ZOFRAN) injection 4 mg  4 mg Intravenous Q6H PRN Madalyn Rob, MD   4 mg at 02/27/20 1024  . pantoprazole (PROTONIX) injection 40 mg  40 mg Intravenous Q12H Damita Dunnings B, MD   40 mg at 02/27/20 1026   Current Outpatient Medications  Medication Sig Dispense Refill  . acetaminophen (TYLENOL) 500 MG tablet Take 1,000-2,000 mg by mouth every 6 (six) hours as needed for headache.    . albuterol (VENTOLIN HFA) 108 (90 Base) MCG/ACT inhaler Inhale 1-2 puffs into the lungs every 6 (six) hours as needed for wheezing or shortness of breath. 8 g 0  . ALPRAZolam (XANAX) 0.5 MG tablet Take 0.5 mg by mouth in the morning, at noon, in the evening, and at bedtime.    . diphenhydramine-acetaminophen (TYLENOL PM) 25-500 MG TABS tablet Take 2-4 tablets by  mouth at bedtime as needed (headaches).     . gabapentin (NEURONTIN) 300 MG capsule TAKE 1 CAPSULE BY MOUTH EVERYDAY AT BEDTIME (Patient taking differently: Take 300 mg by mouth 3 (three) times daily. ) 90 capsule 0  . lisinopril (PRINIVIL,ZESTRIL) 20 MG tablet TAKE 1 TABLET (20 MG TOTAL) BY MOUTH DAILY. 30 tablet 0  . QUEtiapine (SEROQUEL) 25 MG  tablet Take 25 mg by mouth at bedtime.   1  . zolpidem (AMBIEN) 5 MG tablet Take 5 mg by mouth at bedtime.   5  . acetaminophen (TYLENOL) 325 MG tablet Take 2 tablets (650 mg total) by mouth every 6 (six) hours as needed. (Patient not taking: Reported on 02/26/2020) 30 tablet 0  . Colchicine 0.6 MG CAPS Take 1 capsule by mouth daily as needed. (Patient not taking: Reported on 02/26/2020) 30 capsule 2  . diclofenac Sodium (VOLTAREN) 1 % GEL Apply 4 g topically 4 (four) times daily. (Patient not taking: Reported on 02/26/2020) 100 g 0  . ondansetron (ZOFRAN) 4 MG tablet Take 1 tablet (4 mg total) by mouth every 6 (six) hours as needed for nausea. (Patient not taking: Reported on 02/26/2020) 20 tablet 0  . pantoprazole (PROTONIX) 40 MG tablet Take 1 tablet (40 mg total) by mouth 2 (two) times daily. (Patient taking differently: Take 40 mg by mouth daily. ) 60 tablet 6  . sucralfate (CARAFATE) 1 g tablet Take 1 tablet (1 g total) by mouth 4 (four) times daily -  with meals and at bedtime. (Patient not taking: Reported on 02/26/2020) 15 tablet 0  . sucralfate (CARAFATE) 1 GM/10ML suspension Take 1 gram ( 10 ml) between meals and at bedtime. (Patient not taking: Reported on 02/26/2020) 420 mL 3  . traMADol (ULTRAM) 50 MG tablet Take 1 tablet by mouth every 6 hours as needed for pain. (Patient not taking: Reported on 02/26/2020) 25 tablet 0    Allergies as of 02/26/2020  . (No Known Allergies)    Family History  Problem Relation Age of Onset  . Thyroid disease Mother   . Diabetes Father   . Heart disease Father   . Hyperlipidemia Father   . Hypertension Father   . Diabetes Brother   . Hypertension Brother   . Hyperlipidemia Brother   . Colon polyps Brother   . Thyroid disease Daughter   . Obesity Daughter   . Mental illness Daughter   . Colon cancer Maternal Uncle   . Stomach cancer Neg Hx   . Pancreatic cancer Neg Hx     Social History   Socioeconomic History  . Marital status: Single     Spouse name: Not on file  . Number of children: 2  . Years of education: Not on file  . Highest education level: Not on file  Occupational History  . Not on file  Tobacco Use  . Smoking status: Light Tobacco Smoker    Packs/day: 0.50    Years: 43.00    Pack years: 21.50    Last attempt to quit: 09/05/2016    Years since quitting: 3.4  . Smokeless tobacco: Never Used  . Tobacco comment: patches have helped in the past  Vaping Use  . Vaping Use: Never used  Substance and Sexual Activity  . Alcohol use: No  . Drug use: No  . Sexual activity: Not Currently    Partners: Male  Other Topics Concern  . Not on file  Social History Narrative   Patient had multiple death in  her family over a short period of time. father, brother, uncle and niece who was stabbed 3 years ago. Patient's husband had a car accident last year and  need currently a feeding tube.          Social Determinants of Health   Financial Resource Strain:   . Difficulty of Paying Living Expenses: Not on file  Food Insecurity:   . Worried About Charity fundraiser in the Last Year: Not on file  . Ran Out of Food in the Last Year: Not on file  Transportation Needs:   . Lack of Transportation (Medical): Not on file  . Lack of Transportation (Non-Medical): Not on file  Physical Activity:   . Days of Exercise per Week: Not on file  . Minutes of Exercise per Session: Not on file  Stress:   . Feeling of Stress : Not on file  Social Connections:   . Frequency of Communication with Friends and Family: Not on file  . Frequency of Social Gatherings with Friends and Family: Not on file  . Attends Religious Services: Not on file  . Active Member of Clubs or Organizations: Not on file  . Attends Archivist Meetings: Not on file  . Marital Status: Not on file  Intimate Partner Violence:   . Fear of Current or Ex-Partner: Not on file  . Emotionally Abused: Not on file  . Physically Abused: Not on file  .  Sexually Abused: Not on file    Review of Systems: All systems reviewed and negative except where noted in HPI.   OBJECTIVE:    Physical Exam: Vital signs in last 24 hours: Temp:  [97.6 F (36.4 C)] 97.6 F (36.4 C) (10/21 1356) Pulse Rate:  [91-113] 100 (10/22 1115) Resp:  [10-35] 14 (10/22 1115) BP: (69-142)/(32-83) 124/58 (10/22 1115) SpO2:  [94 %-100 %] 95 % (10/22 1115) Weight:  [84.4 kg] 84.4 kg (10/22 0901)   General:   Alert, obese female in NAD Psych:  Pleasant, cooperative. Normal mood and affect. Eyes:  Pupils equal, sclera clear, no icterus.   Conjunctiva pink. Ears:  Normal auditory acuity. Nose:  No deformity, discharge,  or lesions. Neck:  Supple; no masses Lungs:  A few scattered expiratory wheezes.  Heart:  Regular rate and rhythm;, no lower extremity edema Abdomen:  Soft, non-distended, mild epigastric tenderness.. BS active, no palp mass   Rectal:  Light brown soft stool in vault.  Msk:  Symmetrical without gross deformities. . Neurologic:  Alert and  oriented x4;  grossly normal neurologically. Skin:  Intact without significant lesions or rashes.  Filed Weights   02/26/20 1648 02/27/20 0901  Weight: 84.4 kg 84.4 kg     Scheduled inpatient medications . heparin  5,000 Units Subcutaneous Q8H  . pantoprazole (PROTONIX) IV  40 mg Intravenous Q12H      Intake/Output from previous day: 10/21 0701 - 10/22 0700 In: 1085.7 [I.V.:985.7; IV Piggyback:100] Out: 1200 [Emesis/NG output:450] Intake/Output this shift: Total I/O In: -  Out: 2100 [Urine:2100]   Lab Results: Recent Labs    02/26/20 1506 02/26/20 2017 02/27/20 0243  WBC 10.2 10.7* 8.2  HGB 11.6* 10.6* 9.4*  HCT 35.9* 32.6* 29.1*  PLT 320 292 266   BMET Recent Labs    02/26/20 1506 02/26/20 2017 02/27/20 0243  NA 133* 135 136  K 4.3 4.3 4.5  CL 98 104 106  CO2 20* 19* 21*  GLUCOSE 142* 149* 130*  BUN 31* 31* 27*  CREATININE 2.53* 1.84* 1.48*  CALCIUM 9.9 8.5* 8.4*    LFT Recent Labs    02/27/20 0243  PROT 5.7*  ALBUMIN 3.2*  AST 17  ALT 10  ALKPHOS 58  BILITOT 0.5   PT/INR Recent Labs    02/26/20 1506  LABPROT 12.8  INR 1.0   Hepatitis Panel No results for input(s): HEPBSAG, HCVAB, HEPAIGM, HEPBIGM in the last 72 hours.   . CBC Latest Ref Rng & Units 02/27/2020 02/26/2020 02/26/2020  WBC 4.0 - 10.5 K/uL 8.2 10.7(H) 10.2  Hemoglobin 12.0 - 15.0 g/dL 9.4(L) 10.6(L) 11.6(L)  Hematocrit 36 - 46 % 29.1(L) 32.6(L) 35.9(L)  Platelets 150 - 400 K/uL 266 292 320    . CMP Latest Ref Rng & Units 02/27/2020 02/26/2020 02/26/2020  Glucose 70 - 99 mg/dL 130(H) 149(H) 142(H)  BUN 8 - 23 mg/dL 27(H) 31(H) 31(H)  Creatinine 0.44 - 1.00 mg/dL 1.48(H) 1.84(H) 2.53(H)  Sodium 135 - 145 mmol/L 136 135 133(L)  Potassium 3.5 - 5.1 mmol/L 4.5 4.3 4.3  Chloride 98 - 111 mmol/L 106 104 98  CO2 22 - 32 mmol/L 21(L) 19(L) 20(L)  Calcium 8.9 - 10.3 mg/dL 8.4(L) 8.5(L) 9.9  Total Protein 6.5 - 8.1 g/dL 5.7(L) 6.1(L) 7.1  Total Bilirubin 0.3 - 1.2 mg/dL 0.5 0.6 0.7  Alkaline Phos 38 - 126 U/L 58 65 81  AST 15 - 41 U/L 17 21 21   ALT 0 - 44 U/L 10 12 13    Studies/Results: CT Abdomen Pelvis Wo Contrast  Result Date: 02/26/2020 CLINICAL DATA:  Abdominal pain. EXAM: CT ABDOMEN AND PELVIS WITHOUT CONTRAST TECHNIQUE: Multidetector CT imaging of the abdomen and pelvis was performed following the standard protocol without IV contrast. COMPARISON:  November 27, 2017 FINDINGS: Lower chest: No acute abnormality. Hepatobiliary: No focal liver abnormality is seen. No gallstones, gallbladder wall thickening, or biliary dilatation. Pancreas: Unremarkable. No pancreatic ductal dilatation or surrounding inflammatory changes. Spleen: Normal in size without focal abnormality. Adrenals/Urinary Tract: Adrenal glands are unremarkable. Kidneys are normal in size, without renal calculi or hydronephrosis. Stable, adjacent, 2.1 cm x 2.0 cm and 1.5 cm x 1.2 cm exophytic left renal cysts are  seen. Bladder is unremarkable. Stomach/Bowel: There is a small hiatal hernia. Appendix appears normal. Mildly dilated small bowel loops are seen within the lower abdomen (maximum small bowel diameter of approximately 3.0 cm). A clear transition zone is not identified. Noninflamed diverticula are seen within the sigmoid colon. Vascular/Lymphatic: There is marked severity calcification of the common iliac arteries, without evidence of aneurysmal dilatation. No enlarged abdominal or pelvic lymph nodes. Reproductive: Status post hysterectomy. No adnexal masses. Other: No abdominal wall hernia or abnormality. No abdominopelvic ascites. Musculoskeletal: Degenerative changes seen within the lumbar spine, most prominent at the level of L5-S1. IMPRESSION: 1. Findings consistent with a mild ileus versus distal partial small bowel obstruction. 2. Sigmoid diverticulosis. 3. Small hiatal hernia. 4. Stable left renal cysts. 5. Aortic atherosclerosis. Aortic Atherosclerosis (ICD10-I70.0). Electronically Signed   By: Virgina Norfolk M.D.   On: 02/26/2020 15:30   DG Chest Port 1 View  Result Date: 02/26/2020 CLINICAL DATA:  Shortness of breath. EXAM: PORTABLE CHEST 1 VIEW COMPARISON:  November 27, 2017. FINDINGS: The heart size and mediastinal contours are within normal limits. No pneumothorax or pleural effusion is noted. Minimal bibasilar subsegmental atelectasis or scarring is noted. The visualized skeletal structures are unremarkable. IMPRESSION: Minimal bibasilar subsegmental atelectasis or scarring. Aortic Atherosclerosis (ICD10-I70.0). Electronically Signed   By: Bobbe Medico.D.  On: 02/26/2020 15:18   US Abdomen Limited RUQ (LIVER/GB)  Result Date: 02/26/2020 CLINICAL DATA:  Right upper quadrant pain EXAM: ULTRASOUND ABDOMEN LIMITED RIGHT UPPER QUADRANT COMPARISON:  CT 02/26/2020, ultrasound 01/08/2018 FINDINGS: Gallbladder: No gallstones or wall thickening visualized. No sonographic Murphy sign noted by  sonographer. Common bile duct: Diameter: 3 mm Liver: No focal lesion identified. Within normal limits in parenchymal echogenicity. Portal vein is patent on color Doppler imaging with normal direction of blood flow towards the liver. Other: None. IMPRESSION: Negative right upper quadrant abdominal ultrasound Electronically Signed   By: Donavan Foil M.D.   On: 02/26/2020 17:47    Tye Savoy, NP-C @  02/27/2020, 11:39 AM   ________________________________________________________________________  Velora Heckler GI MD note:  I personally examined the patient, reviewed the data and agree with the assessment and plan described above.  64 year old woman with abdominal pain, vomiting in the setting of 3 months of weight loss, abdominal discomfort after eating, nausea.  Non IV contrast CT scan suggested partial small bowel obstruction versus ileus however I am not really sure that either of those are actually a problem for her.  I reviewed the imaging myself and the small bowel findings are quite underwhelming to me.  I have more concern for upper GI, gastric or duodenal process.  Indeed she had H. pylori positive gastritis 3 years ago and perhaps it was never eradicated after Pylera antibiotic treatment.  Her mid to distal duodenum on my review looks a bit unusual on CT scan as well.  I think she needs upper endoscopic evaluation and if that is not helpful then better quality IV contrast CT scan abdomen pelvis.  We will keep her on proton pump inhibitors in the meantime, IV fluid hydration.   Owens Loffler, MD [facility] Gastroenterology Pager [phone]

## 2020-02-27 NOTE — ED Notes (Signed)
Assumed care of patient. Patient resting with eyes closed on stretcher. Patient has NG tube in place. Awaiting admission.

## 2020-02-28 ENCOUNTER — Inpatient Hospital Stay (HOSPITAL_COMMUNITY): Payer: Medicaid Other | Admitting: Certified Registered Nurse Anesthetist

## 2020-02-28 ENCOUNTER — Encounter (HOSPITAL_COMMUNITY)
Admission: EM | Disposition: A | Discharge status: Home / Self Care | Payer: Self-pay | Source: Home / Self Care | Attending: Internal Medicine

## 2020-02-28 DIAGNOSIS — K2970 Gastritis, unspecified, without bleeding: Secondary | ICD-10-CM

## 2020-02-28 DIAGNOSIS — K920 Hematemesis: Secondary | ICD-10-CM

## 2020-02-28 DIAGNOSIS — D509 Iron deficiency anemia, unspecified: Secondary | ICD-10-CM

## 2020-02-28 DIAGNOSIS — F39 Unspecified mood [affective] disorder: Secondary | ICD-10-CM

## 2020-02-28 HISTORY — PX: BIOPSY: SHX5522

## 2020-02-28 HISTORY — PX: ESOPHAGOGASTRODUODENOSCOPY (EGD) WITH PROPOFOL: SHX5813

## 2020-02-28 LAB — BASIC METABOLIC PANEL
Anion gap: 9 (ref 5–15)
BUN: 13 mg/dL (ref 8–23)
CO2: 24 mmol/L (ref 22–32)
Calcium: 9.5 mg/dL (ref 8.9–10.3)
Chloride: 104 mmol/L (ref 98–111)
Creatinine, Ser: 0.99 mg/dL (ref 0.44–1.00)
GFR, Estimated: >60 mL/min (ref >60)
Glucose, Bld: 114 mg/dL — ABNORMAL HIGH (ref 70–99)
Potassium: 4.2 mmol/L (ref 3.5–5.1)
Sodium: 137 mmol/L (ref 135–145)

## 2020-02-28 LAB — CBC WITH DIFFERENTIAL/PLATELET
Abs Immature Granulocytes: 0.01 10*3/uL (ref 0.00–0.07)
Basophils Absolute: 0 10*3/uL (ref 0.0–0.1)
Basophils Relative: 0 %
Eosinophils Absolute: 0 10*3/uL (ref 0.0–0.5)
Eosinophils Relative: 1 %
HCT: 30.9 % — ABNORMAL LOW (ref 36.0–46.0)
Hemoglobin: 10.1 g/dL — ABNORMAL LOW (ref 12.0–15.0)
Immature Granulocytes: 0 %
Lymphocytes Relative: 38 %
Lymphs Abs: 2.9 10*3/uL (ref 0.7–4.0)
MCH: 32.1 pg (ref 26.0–34.0)
MCHC: 32.7 g/dL (ref 30.0–36.0)
MCV: 98.1 fL (ref 80.0–100.0)
Monocytes Absolute: 0.6 10*3/uL (ref 0.1–1.0)
Monocytes Relative: 8 %
Neutro Abs: 4.1 10*3/uL (ref 1.7–7.7)
Neutrophils Relative %: 53 %
Platelets: 262 10*3/uL (ref 150–400)
RBC: 3.15 MIL/uL — ABNORMAL LOW (ref 3.87–5.11)
RDW: 12.7 % (ref 11.5–15.5)
WBC: 7.6 10*3/uL (ref 4.0–10.5)
nRBC: 0 % (ref 0.0–0.2)

## 2020-02-28 LAB — URINE CULTURE: Special Requests: NORMAL

## 2020-02-28 SURGERY — ESOPHAGOGASTRODUODENOSCOPY (EGD) WITH PROPOFOL
Anesthesia: Monitor Anesthesia Care

## 2020-02-28 MED ORDER — PROPOFOL 500 MG/50ML IV EMUL
INTRAVENOUS | Status: DC | PRN
  Administered 2020-02-28: 100 ug/kg/min via INTRAVENOUS

## 2020-02-28 MED ORDER — PROPOFOL 10 MG/ML IV BOLUS
INTRAVENOUS | Status: DC | PRN
  Administered 2020-02-28 (×5): 20 mg via INTRAVENOUS

## 2020-02-28 MED ORDER — BOOST / RESOURCE BREEZE PO LIQD CUSTOM
1.0000 | Freq: Three times a day (TID) | ORAL | Status: DC
  Administered 2020-02-28 – 2020-03-01 (×4): 1 via ORAL

## 2020-02-28 MED ORDER — METOCLOPRAMIDE HCL 5 MG/ML IJ SOLN
5.0000 mg | Freq: Four times a day (QID) | INTRAMUSCULAR | Status: AC
  Administered 2020-02-28 – 2020-03-01 (×8): 5 mg via INTRAVENOUS
  Filled 2020-02-28 (×7): qty 2

## 2020-02-28 SURGICAL SUPPLY — 15 items

## 2020-02-28 NOTE — Anesthesia Preprocedure Evaluation (Addendum)
Anesthesia Evaluation  Patient identified by MRN, date of birth, ID band Patient awake    Reviewed: Patient's Chart, lab work & pertinent test results  Airway Mallampati: II  TM Distance: >3 FB Neck ROM: Full    Dental  (+) Edentulous Upper, Poor Dentition, Missing   Pulmonary neg pulmonary ROS, Current Smoker,    Pulmonary exam normal        Cardiovascular hypertension, Pt. on medications  Rhythm:Regular Rate:Normal     Neuro/Psych  Headaches, Anxiety Depression    GI/Hepatic GERD  ,(+)     substance abuse  cocaine use, Last use 2007 GIB   Endo/Other  negative endocrine ROS  Renal/GU   Female GU complaint H/o cervical cancer    Musculoskeletal  (+) Arthritis ,   Abdominal (+)  Abdomen: soft. Bowel sounds: normal.  Peds  Hematology  (+) anemia ,   Anesthesia Other Findings   Reproductive/Obstetrics                            Anesthesia Physical Anesthesia Plan  ASA: II  Anesthesia Plan: MAC   Post-op Pain Management:    Induction:   PONV Risk Score and Plan: 1 and Ondansetron, Propofol infusion and Treatment may vary due to age or medical condition  Airway Management Planned: Simple Face Mask and Nasal Cannula  Additional Equipment: None  Intra-op Plan:   Post-operative Plan:   Informed Consent: I have reviewed the patients History and Physical, chart, labs and discussed the procedure including the risks, benefits and alternatives for the proposed anesthesia with the patient or authorized representative who has indicated his/her understanding and acceptance.     Dental advisory given  Plan Discussed with: CRNA  Anesthesia Plan Comments: (Lab Results      Component                Value               Date                      WBC                      7.6                 02/28/2020                HGB                      10.1 (L)            02/28/2020                 HCT                      30.9 (L)            02/28/2020                MCV                      98.1                02/28/2020                PLT                      262  02/28/2020          )        Anesthesia Quick Evaluation

## 2020-02-28 NOTE — Op Note (Signed)
[facility] Patient Name: Lori [name] Procedure Date : 02/28/2020 MRN: [id number] Attending MD: Estill Cotta. Loletha Carrow , MD Date of Birth: [birth year] CSN: [id number] Age: 64 Admit Type: Inpatient Procedure:                Upper GI endoscopy Indications:              Generalized abdominal pain, Hematemesis, Previously                            treated for Helicobacter pylori, Nausea with                            vomiting, Weight loss. Admitted with acute on                            chronic symptoms, AKI from volume depletion,                            macrocytic anemia. [place] CTAP with questionable mild                            dilated small bowel loops without transition point. Providers:                Estill Cotta. Loletha Carrow, MD, Benetta Spar RN, RN, Laverda Sorenson, Technician, Clearnce Sorrel, CRNA Referring MD:             [facility] Medicines:                Monitored Anesthesia Care Complications:            No immediate complications. Estimated Blood Loss:     Estimated blood loss was minimal. Procedure:                Pre-Anesthesia Assessment:                           - Prior to the procedure, a History and Physical                            was performed, and patient medications and                            allergies were reviewed. The patient's tolerance of                            previous anesthesia was also reviewed. The risks                            and benefits of the procedure and the sedation                            options and risks were discussed with the patient.  All questions were answered, and informed consent                            was obtained. Prior Anticoagulants: The patient has                            taken no previous anticoagulant or antiplatelet                            agents. ASA Grade Assessment: III - A patient with                            severe systemic disease.  After reviewing the risks                            and benefits, the patient was deemed in                            satisfactory condition to undergo the procedure.                           After obtaining informed consent, the endoscope was                            passed under direct vision. Throughout the                            procedure, the patient's blood pressure, pulse, and                            oxygen saturations were monitored continuously. The                            GIF-H190 ([id number] Olympus gastroscope was                            introduced through the mouth, and advanced to the                            second part of duodenum. The upper GI endoscopy was                            accomplished without difficulty. The patient                            tolerated the procedure well (required high dose                            propofol). Scope In: Scope Out: Findings:      The esophagus was normal.      Patchy moderate inflammation characterized by adherent blood, congestion       (edema) and erythema was found in the gastric fundus and in the gastric       body. Several biopsies were obtained in  the gastric body and in the       gastric antrum with cold forceps for histology (to rule out persistent       H. pylori). This appears to be the result of vomiting, not the cause of       it. It explains the small volume hematemesis.      There was no gastric dilatation, retained food/fluid, pyloric stenosis       or mass.      The exam of the stomach was otherwise normal.      The examined duodenum was normal. Impression:               - Normal esophagus.                           - Gastritis.                           - Normal examined duodenum.                           - Several biopsies were obtained in the gastric                            body and in the gastric antrum to rule out                            persistent H. pylori (treated years  ago). Recommendation:           - Clear liquid diet.                           - Use Reglan (metoclopramide) 5 mg IV QID for 2                            days. (QTC this admission normal at 462).                            Ondansetron as prescribed prn can be used for                            breakthrough N/V.                           - Return patient to hospital ward for ongoing care.                           - Await pathology results.                           - When nausea and vomiting under control, and as                            long as renal function remains normal, repeat CT                            scan with oral and IV contrast. Procedure Code(s):        ---  Professional ---                           [phone], Esophagogastroduodenoscopy, flexible,                            transoral; with biopsy, single or multiple Diagnosis Code(s):        --- Professional ---                           K29.70, Gastritis, unspecified, without bleeding                           R10.84, Generalized abdominal pain                           K92.0, Hematemesis                           R11.2, Nausea with vomiting, unspecified                           R63.4, Abnormal weight loss CPT copyright 2019 American Medical Association. All rights reserved. The codes documented in this report are preliminary and upon coder review may  be revised to meet current compliance requirements. Nazier Neyhart L. Loletha Carrow, MD 02/28/2020 8:54:52 AM This report has been signed electronically. Number of Addenda: 0

## 2020-02-28 NOTE — Interval H&P Note (Signed)
History and Physical Interval Note:  02/28/2020 8:19 AM  Lori [name]  has presented today for surgery, with the diagnosis of epigastric pain, vomiting, hematemesis.  The various methods of treatment have been discussed with the patient and family. After consideration of risks, benefits and other options for treatment, the patient has consented to  Procedure(s): ESOPHAGOGASTRODUODENOSCOPY (EGD) WITH PROPOFOL (N/A) as a surgical intervention.  The patient's history has been reviewed, patient examined, no change in status, stable for surgery.  I have reviewed the patient's chart and labs.  Questions were answered to the patient's satisfaction.    Patient stable.  Hgb stable about 10, renal function normalized.  Nelida Meuse III

## 2020-02-28 NOTE — Progress Notes (Signed)
   Subjective:   No overnight events.  Patient in NPO for EGD and reports no hematemesis overnight.  Nauseous this morning.  Reports a bowel movement yesterday. No other acute concerns.  Objective:  Vital signs in last 24 hours: Vitals:   02/27/20 1954 02/28/20 0033 02/28/20 0400 02/28/20 0449  BP: (!) 109/56 (!) 120/48 (!) 136/54 (!) 152/69  Pulse: 87 98 87 87  Resp: 14 18 16 18   Temp: 98.1 F (36.7 C) 98.1 F (36.7 C) 98.7 F (37.1 C) 97.9 F (36.6 C)  TempSrc: Oral Oral Oral Oral  SpO2: 97% 100% 97% 99%  Weight:    83.8 kg  Height:        General: NAD, nl appearance Cardiovascular: Normal rate, regular rhythm.  No murmurs, rubs, or gallops Pulmonary : Effort normal, breath sounds normal. No wheezes, rales, or rhonchi Abdominal: soft, nontender,  bowel sounds present  BMP wnl CBC borderline macrocytic anemia , Hgb Stable  , 9.4 to 10.1  Assessment/Plan:  Principal Problem:   Hematemesis with nausea Active Problems:   AKI (acute kidney injury) ([place])   Shock (HCC)   Epigastric pain  Lori [name] is a 29 yowith PMH of GERD, HTN, Depression, Arthritis, cocaine abuse who presents with abdominal pain and hematemesis.  Abdominal pain associated w/ Hematemesis -History of H.Pylori gastritis s/p treated in 2018 with Pylera.  EGD today with Dr. Ardis Hughs.  Will follow recommendations.   -Hgb stable overnight - Plan for EGD today - IV Protonix 40mg  BID - NPO -continue mIVF LR @100ml /h - GI consult appreciate further recc's  Macrocytic anemia  B12 379 and Folate 10.1 , hemoglobin stable.  Continue to trend with daily CBC.  Mood disorder Patient has depression and takes Seroquel 25 QHS, Ambien 5mg   and Xanax 0.5mg  QID. Home medication list suggest patient has significant anxiety disorder.  - Will restart when patient is not NPO -As needed Ativan, given patient is at risk for withdrawal from Xanax  HTN Patient has hx of hypertension. Patient presented hypotensive  and required levophed at presentation to ED. Home medication is lisinopril 20mg  - Will hold at this time.  AKI Patient presented with renal failure in the setting of hypovolemic shock. Baseline Cr appears to be around 1.0. Prerenal AKI improved with IV fluids, creatinine 0.99 today.   Prior to Admission Living Arrangement: Home Anticipated Discharge Location: Home Barriers to Discharge: Treatment Dispo: Anticipated discharge in approximately 1-2 day(s).   Madalyn Rob, MD 02/28/2020, 6:23 AM Pager: [phone] After 5pm on weekdays and 1pm on weekends: On Call pager [phone]

## 2020-02-28 NOTE — Hospital Course (Addendum)
[name] is a 64 yo with PMH of GERD, HTN, Depression, Arthritis, cocaine abuse who presents with abdominal pain and hematemesis.   Abdominal pain associated w/ Hematemesis Initially there was concern for SBO and patient was seen by Gen surgery and received NG tube. Patient received mIVF as she was NPO. Patient received CT abdomen pelvis did not show signs of SBO and patient reported recent BM prior to admission. NG tube was removed and patient was started on diet. [place] of Abdomen was not significant. Patient also had IV Ppx and transitioned to PO PPI with introduction of diet. GI was consulted due to report of hematemesis and description concerning for gastric vs duodenal ulcers. GI did EGD which noted gastritis and samples were taken for H pylori, but no ulcerations were seen. CT Abdomen also noted gastritis. Patient symptoms improved by discharge and sent home with PPI rx.   Macrocytic anemia Patient B12 379 and Folate 10.1, suggested workup outpatient. Patient Hgb stabilized.    Mood disorder Patient was restarted on her Seroquel and Xanax once stable. Patient Xanax was made TID PRN. Patient Ambien was held and was not recommended that this be restarted at discharge. Patient was provided ramelteon inpatient.   Hypotensive shock- resolved Patient presented with signs of hypotensive shock. Patient received Levophed and 3L fluid resuscitation and responded well. Patient was started on cefepime and flagyl that was discontinued as patient vitals and LA stabilized and returned normal with fluid resuscitation. Bcx  had no growth.   AKI-resolved Patient presented with renal failure in the setting of hypovolemic shock. Baseline Cr appears to be around 1.0. Prerenal AKI improved with IV fluids, creatinine 0.99.

## 2020-02-28 NOTE — Transfer of Care (Signed)
Immediate Anesthesia Transfer of Care Note  Patient: Lori [name]  Procedure(s) Performed: ESOPHAGOGASTRODUODENOSCOPY (EGD) WITH PROPOFOL (N/A ) BIOPSY  Patient Location: PACU  Anesthesia Type:MAC  Level of Consciousness: awake, alert  and oriented  Airway & Oxygen Therapy: Patient Spontanous Breathing  Post-op Assessment: Report given to RN and Post -op Vital signs reviewed and stable  Post vital signs: Reviewed and stable  Last Vitals:  Vitals Value Taken Time  BP 133/60 02/28/20 0850  Temp 36.1 C 02/28/20 0850  Pulse 89 02/28/20 0850  Resp 17 02/28/20 0855  SpO2    Vitals shown include unvalidated device data.  Last Pain:  Vitals:   02/28/20 0811  TempSrc: Oral  PainSc: 0-No pain         Complications: No complications documented.

## 2020-02-28 NOTE — Anesthesia Postprocedure Evaluation (Signed)
Anesthesia Post Note  Patient: Lori [name]  Procedure(s) Performed: ESOPHAGOGASTRODUODENOSCOPY (EGD) WITH PROPOFOL (N/A ) BIOPSY     Patient location during evaluation: PACU Anesthesia Type: MAC Level of consciousness: awake and alert Pain management: pain level controlled Vital Signs Assessment: post-procedure vital signs reviewed and stable Respiratory status: spontaneous breathing, nonlabored ventilation, respiratory function stable and patient connected to nasal cannula oxygen Cardiovascular status: stable and blood pressure returned to baseline Postop Assessment: no apparent nausea or vomiting Anesthetic complications: no   No complications documented.  Last Vitals:  Vitals:   02/28/20 0850 02/28/20 0905  BP: 133/60 139/64  Pulse: 89 82  Resp: (!) 25 (!) 22  Temp: (!) 36.1 C (!) 36.1 C  SpO2: 96% 100%    Last Pain:  Vitals:   02/28/20 0850  TempSrc:   PainSc: 0-No pain                 March Rummage Merritt Kibby

## 2020-02-28 NOTE — Progress Notes (Signed)
Day of Surgery   Subjective/Chief Complaint: Patient seen in Endoscopy - still sleepy Gastritis seen on EGD Patient had BM yesterday.   Objective: Vital signs in last 24 hours: Temp:  [97 F (36.1 C)-98.7 F (37.1 C)] 97 F (36.1 C) (10/23 0905) Pulse Rate:  [82-106] 82 (10/23 0905) Resp:  [11-25] 22 (10/23 0905) BP: (106-167)/(45-73) 139/64 (10/23 0905) SpO2:  [92 %-100 %] 100 % (10/23 0905) Weight:  [83.7 kg-83.8 kg] 83.8 kg (10/23 0449) Last BM Date: 02/27/20  Intake/Output from previous day: 10/22 0701 - 10/23 0700 In: 1376.8 [P.O.:10; I.V.:1366.8] Out: 3610 [Urine:3610] Intake/Output this shift: Total I/O In: 1100 [I.V.:1100] Out: -   Sleepy Abd mildly distended; minimal tenderness  Lab Results:  Recent Labs    02/27/20 0243 02/28/20 0410  WBC 8.2 7.6  HGB 9.4* 10.1*  HCT 29.1* 30.9*  PLT 266 262   BMET Recent Labs    02/27/20 0243 02/28/20 0410  NA 136 137  K 4.5 4.2  CL 106 104  CO2 21* 24  GLUCOSE 130* 114*  BUN 27* 13  CREATININE 1.48* 0.99  CALCIUM 8.4* 9.5   PT/INR Recent Labs    02/26/20 1506  LABPROT 12.8  INR 1.0   ABG No results for input(s): PHART, HCO3 in the last 72 hours.  Invalid input(s): PCO2, PO2  Studies/Results: CT Abdomen Pelvis Wo Contrast  Result Date: 02/26/2020 CLINICAL DATA:  Abdominal pain. EXAM: CT ABDOMEN AND PELVIS WITHOUT CONTRAST TECHNIQUE: Multidetector CT imaging of the abdomen and pelvis was performed following the standard protocol without IV contrast. COMPARISON:  November 27, 2017 FINDINGS: Lower chest: No acute abnormality. Hepatobiliary: No focal liver abnormality is seen. No gallstones, gallbladder wall thickening, or biliary dilatation. Pancreas: Unremarkable. No pancreatic ductal dilatation or surrounding inflammatory changes. Spleen: Normal in size without focal abnormality. Adrenals/Urinary Tract: Adrenal glands are unremarkable. Kidneys are normal in size, without renal calculi or hydronephrosis.  Stable, adjacent, 2.1 cm x 2.0 cm and 1.5 cm x 1.2 cm exophytic left renal cysts are seen. Bladder is unremarkable. Stomach/Bowel: There is a small hiatal hernia. Appendix appears normal. Mildly dilated small bowel loops are seen within the lower abdomen (maximum small bowel diameter of approximately 3.0 cm). A clear transition zone is not identified. Noninflamed diverticula are seen within the sigmoid colon. Vascular/Lymphatic: There is marked severity calcification of the common iliac arteries, without evidence of aneurysmal dilatation. No enlarged abdominal or pelvic lymph nodes. Reproductive: Status post hysterectomy. No adnexal masses. Other: No abdominal wall hernia or abnormality. No abdominopelvic ascites. Musculoskeletal: Degenerative changes seen within the lumbar spine, most prominent at the level of L5-S1. IMPRESSION: 1. Findings consistent with a mild ileus versus distal partial small bowel obstruction. 2. Sigmoid diverticulosis. 3. Small hiatal hernia. 4. Stable left renal cysts. 5. Aortic atherosclerosis. Aortic Atherosclerosis (ICD10-I70.0). Electronically Signed   By: Virgina Norfolk M.D.   On: 02/26/2020 15:30   DG Chest Port 1 View  Result Date: 02/26/2020 CLINICAL DATA:  Shortness of breath. EXAM: PORTABLE CHEST 1 VIEW COMPARISON:  November 27, 2017. FINDINGS: The heart size and mediastinal contours are within normal limits. No pneumothorax or pleural effusion is noted. Minimal bibasilar subsegmental atelectasis or scarring is noted. The visualized skeletal structures are unremarkable. IMPRESSION: Minimal bibasilar subsegmental atelectasis or scarring. Aortic Atherosclerosis (ICD10-I70.0). Electronically Signed   By: Marijo Conception M.D.   On: 02/26/2020 15:18   US Abdomen Limited RUQ (LIVER/GB)  Result Date: 02/26/2020 CLINICAL DATA:  Right upper quadrant pain EXAM:  ULTRASOUND ABDOMEN LIMITED RIGHT UPPER QUADRANT COMPARISON:  CT 02/26/2020, ultrasound 01/08/2018 FINDINGS: Gallbladder: No  gallstones or wall thickening visualized. No sonographic Murphy sign noted by sonographer. Common bile duct: Diameter: 3 mm Liver: No focal lesion identified. Within normal limits in parenchymal echogenicity. Portal vein is patent on color Doppler imaging with normal direction of blood flow towards the liver. Other: None. IMPRESSION: Negative right upper quadrant abdominal ultrasound Electronically Signed   By: Donavan Foil M.D.   On: 02/26/2020 17:47    Anti-infectives: Anti-infectives (From admission, onward)   Start     Dose/Rate Route Frequency Ordered Stop   02/27/20 1600  ceFEPIme (MAXIPIME) 2 g in sodium chloride 0.9 % 100 mL IVPB  Status:  Discontinued        2 g 200 mL/hr over 30 Minutes Intravenous Every 24 hours 02/26/20 1749 02/27/20 0828   02/27/20 0400  ceFEPIme (MAXIPIME) 2 g in sodium chloride 0.9 % 100 mL IVPB  Status:  Discontinued        2 g 200 mL/hr over 30 Minutes Intravenous Every 12 hours 02/26/20 1719 02/26/20 1749   02/26/20 1615  ceFEPIme (MAXIPIME) 2 g in sodium chloride 0.9 % 100 mL IVPB        2 g 200 mL/hr over 30 Minutes Intravenous  Once 02/26/20 1603 02/26/20 1847   02/26/20 1615  metroNIDAZOLE (FLAGYL) IVPB 500 mg        500 mg 100 mL/hr over 60 Minutes Intravenous  Once 02/26/20 1603 02/26/20 1742      Assessment/Plan: HxHTN Abnormal UA- UA w/ large leukocytes, urine culture pending  AKI - improving Lactic acidosis - lactic acid 2.1yesterday and now 1.2, normalized with IVF Obesity - BMI 34.02  Abdominal pain - improved Gastritis  - hgb 9.4 from 10.6 - Continue BID PPI - sent H. Pylori as well  Ileus vs SBO on non-contrast CT study Appears to be resolved  FEN: Diet per GI VTE: SQ heparin  ID: cefepime/flagyl 10/21  There are no current surgical issues.  We will sign off.   LOS: 2 days    Maia Petties 02/28/2020

## 2020-02-28 NOTE — Progress Notes (Signed)
Initial Nutrition Assessment  DOCUMENTATION CODES:   Obesity unspecified  INTERVENTION:   -Boost Breeze po TID, each supplement provides 250 kcal and 9 grams of protein  NUTRITION DIAGNOSIS:   Inadequate oral intake related to nausea, vomiting as evidenced by per patient/family report.  GOAL:   Patient will meet greater than or equal to 90% of their needs  MONITOR:   PO intake, Diet advancement, Labs, Weight trends, Supplement acceptance, I & O's  REASON FOR ASSESSMENT:   Malnutrition Screening Tool    ASSESSMENT:   64 yo with PMH of GERD, HTN, Depression, Arthritis, cocaine abuse who presents with abdominal pain and hematemesis.  Pt is s/p EGD this morning which revealed gastritis. Pt reports continued nausea and burning after she eats for 3 months now. Pt now on clears, will order Boost Breeze supplements for additional kcals and protein.  Pt reports prior to symptom onset she was trying to lose weight but weight loss since has been d/t poor PO. Weight loss reported is 20 lbs. Last recorded weight is from 2019 PTA.  Labs reviewed. Medications:  IV Reglan, Lactated ringers, IV Zofran  NUTRITION - FOCUSED PHYSICAL EXAM:  Unable to complete  Diet Order:   Diet Order            Diet clear liquid Room service appropriate? Yes; Fluid consistency: Thin  Diet effective now                 EDUCATION NEEDS:   No education needs have been identified at this time  Skin:  Skin Assessment: Reviewed RN Assessment  Last BM:  10/22 -type 1  Height:   Ht Readings from Last 1 Encounters:  02/27/20 5\' 2"  (1.575 m)    Weight:   Wt Readings from Last 1 Encounters:  02/28/20 83.8 kg   BMI:  Body mass index is 33.78 kg/m.  Estimated Nutritional Needs:   Kcal:  1500-1700  Protein:  60-70g  Fluid:  1.7L/day  Clayton Bibles, MS, RD, LDN Inpatient Clinical Dietitian Contact information available via Amion

## 2020-02-29 ENCOUNTER — Inpatient Hospital Stay (HOSPITAL_COMMUNITY): Payer: Medicaid Other

## 2020-02-29 DIAGNOSIS — K920 Hematemesis: Secondary | ICD-10-CM | POA: Diagnosis not present

## 2020-02-29 DIAGNOSIS — R1084 Generalized abdominal pain: Secondary | ICD-10-CM

## 2020-02-29 LAB — BASIC METABOLIC PANEL
Anion gap: 11 (ref 5–15)
BUN: 7 mg/dL — ABNORMAL LOW (ref 8–23)
CO2: 26 mmol/L (ref 22–32)
Calcium: 9.3 mg/dL (ref 8.9–10.3)
Chloride: 99 mmol/L (ref 98–111)
Creatinine, Ser: 0.91 mg/dL (ref 0.44–1.00)
GFR, Estimated: >60 mL/min (ref >60)
Glucose, Bld: 118 mg/dL — ABNORMAL HIGH (ref 70–99)
Potassium: 3.9 mmol/L (ref 3.5–5.1)
Sodium: 136 mmol/L (ref 135–145)

## 2020-02-29 LAB — CBC WITH DIFFERENTIAL/PLATELET
Abs Immature Granulocytes: 0.01 10*3/uL (ref 0.00–0.07)
Basophils Absolute: 0 10*3/uL (ref 0.0–0.1)
Basophils Relative: 0 %
Eosinophils Absolute: 0.1 10*3/uL (ref 0.0–0.5)
Eosinophils Relative: 1 %
HCT: 29.3 % — ABNORMAL LOW (ref 36.0–46.0)
Hemoglobin: 9.6 g/dL — ABNORMAL LOW (ref 12.0–15.0)
Immature Granulocytes: 0 %
Lymphocytes Relative: 44 %
Lymphs Abs: 3.6 10*3/uL (ref 0.7–4.0)
MCH: 31.8 pg (ref 26.0–34.0)
MCHC: 32.8 g/dL (ref 30.0–36.0)
MCV: 97 fL (ref 80.0–100.0)
Monocytes Absolute: 0.6 10*3/uL (ref 0.1–1.0)
Monocytes Relative: 7 %
Neutro Abs: 3.9 10*3/uL (ref 1.7–7.7)
Neutrophils Relative %: 48 %
Platelets: 269 10*3/uL (ref 150–400)
RBC: 3.02 MIL/uL — ABNORMAL LOW (ref 3.87–5.11)
RDW: 12.2 % (ref 11.5–15.5)
WBC: 8.1 10*3/uL (ref 4.0–10.5)
nRBC: 0 % (ref 0.0–0.2)

## 2020-02-29 MED ORDER — IOHEXOL 9 MG/ML PO SOLN
500.0000 mL | ORAL | Status: AC
  Administered 2020-02-29 (×2): 500 mL via ORAL

## 2020-02-29 MED ORDER — ALPRAZOLAM 0.5 MG PO TABS
0.5000 mg | ORAL_TABLET | Freq: Three times a day (TID) | ORAL | Status: DC | PRN
  Administered 2020-02-29 – 2020-03-01 (×3): 0.5 mg via ORAL
  Filled 2020-02-29 (×3): qty 1

## 2020-02-29 MED ORDER — ACETAMINOPHEN 325 MG PO TABS
650.0000 mg | ORAL_TABLET | Freq: Four times a day (QID) | ORAL | Status: DC | PRN
  Administered 2020-02-29: 650 mg via ORAL
  Filled 2020-02-29: qty 2

## 2020-02-29 MED ORDER — IOHEXOL 9 MG/ML PO SOLN
ORAL | Status: AC
  Filled 2020-02-29: qty 1000

## 2020-02-29 MED ORDER — IOHEXOL 300 MG/ML  SOLN
100.0000 mL | Freq: Once | INTRAMUSCULAR | Status: AC | PRN
  Administered 2020-02-29: 100 mL via INTRAVENOUS

## 2020-02-29 MED ORDER — BENZOCAINE 10 % MT GEL
Freq: Three times a day (TID) | OROMUCOSAL | Status: DC | PRN
  Filled 2020-02-29: qty 9

## 2020-02-29 MED ORDER — PANTOPRAZOLE SODIUM 40 MG PO TBEC
40.0000 mg | DELAYED_RELEASE_TABLET | Freq: Two times a day (BID) | ORAL | Status: DC
  Administered 2020-02-29 – 2020-03-01 (×2): 40 mg via ORAL
  Filled 2020-02-29 (×2): qty 1

## 2020-02-29 MED ORDER — QUETIAPINE FUMARATE 25 MG PO TABS
25.0000 mg | ORAL_TABLET | Freq: Every day | ORAL | Status: DC
  Administered 2020-02-29: 25 mg via ORAL
  Filled 2020-02-29: qty 1

## 2020-02-29 MED ORDER — GUAIFENESIN-DM 100-10 MG/5ML PO SYRP
5.0000 mL | ORAL_SOLUTION | ORAL | Status: DC | PRN
  Administered 2020-02-29 (×2): 5 mL via ORAL
  Filled 2020-02-29 (×2): qty 5

## 2020-02-29 MED ORDER — NICOTINE 21 MG/24HR TD PT24
21.0000 mg | MEDICATED_PATCH | Freq: Every day | TRANSDERMAL | Status: DC
  Administered 2020-02-29 – 2020-03-01 (×2): 21 mg via TRANSDERMAL
  Filled 2020-02-29 (×2): qty 1

## 2020-02-29 NOTE — Progress Notes (Signed)
   Subjective:   No acute events overnight  Feeling a lot better today. No nausea or vomiting. Was able to go to the bathroom and have BM.  We discussed the medications she has been started on and plan to follow up results of EGD biopsy.  She agrees with plan.   Objective:  Vital signs in last 24 hours: Vitals:   02/28/20 1715 02/28/20 1948 02/29/20 0054 02/29/20 0422  BP: (!) 127/58 (!) 145/78 (!) 112/47 138/74  Pulse: 84 76 89 92  Resp: 15 16 20 15   Temp: 98.4 F (36.9 C) 99.1 F (37.3 C) (!) 97.1 F (36.2 C) 98.6 F (37 C)  TempSrc: Oral Oral Oral Oral  SpO2:  98% 98% 97%  Weight:    85 kg  Height:        General: NAD, nl appearance Cardiovascular: Normal rate, regular rhythm.  No murmurs, rubs, or gallops Pulmonary : Effort normal, breath sounds normal. No wheezes, rales, or rhonchi Abdominal: soft, nontender,  bowel sounds present  BMP wnl CBC borderline macrocytic anemia , Hgb Stable    Assessment/Plan:  Principal Problem:   Hematemesis with nausea Active Problems:   AKI (acute kidney injury) ([place])   Shock (HCC)   Epigastric pain  Mrs. Javaya [place] is a 32 yowith PMH of GERD, HTN, Depression, Arthritis, cocaine abuse who presents with abdominal pain and hematemesis.  Abdominal pain associated w/ Hematemesis -History of H.Pylori gastritis s/p treated in 2018 with Pylera.  EGD on 10/24, moderate inflammation in the stomach.  Biopsies were taken for H. pylori.  Esophagus and duodenum normal.  Will touch base with GI to see if there is need for repeat CT abdomen with contrast given patient is having normal bowel movements. -Hgb stable  -Stop IV Protonix, start p.o. Protonix 40 mg twice daily -Clear diet -Follow GI recommendations  Macrocytic anemia  B12 379 and Folate 10.1 , hemoglobin stable.  Continue to trend with daily CBC.  Mood disorder Patient has depression and takes Seroquel 25 QHS, Ambien 5mg   and Xanax 0.5mg  QID. Home medication list suggest  patient has significant anxiety disorder.  -Restart home medications Seroquel and reduce frequency of Xanax 3 times daily as needed.  We will hold Ambien and give patient ramelteon.  HTN Patient has hx of hypertension. Patient presented hypotensive and required levophed at presentation to ED. Home medication is lisinopril 20mg  - Will hold at this time.  Prior to Admission Living Arrangement: Home Anticipated Discharge Location: Home Barriers to Discharge: Treatment Dispo: Anticipated discharge in approximately 1-2 day(s).   Madalyn Rob, MD 02/29/2020, 5:50 AM Pager: [phone] After 5pm on weekdays and 1pm on weekends: On Call pager [phone]

## 2020-02-29 NOTE — Progress Notes (Addendum)
[facility] Progress Note  CC:  Hematemesis   Subjective:  She is having mild intermittent nausea. No vomiting. No abdominal pain. She is tolerating a clear liquid diet. She passed a darker loose stool yesterday, no BM today.    Objective:   EGD 02/28/2020:  Vital signs in last 24 hours: Temp:  [97.1 F (36.2 C)-99.1 F (37.3 C)] 98.3 F (36.8 C) (10/24 0800) Pulse Rate:  [76-92] 89 (10/24 0800) Resp:  [15-20] 18 (10/24 0800) BP: (112-145)/(47-117) 131/117 (10/24 0800) SpO2:  [97 %-100 %] 100 % (10/24 0800) Weight:  [85 kg] 85 kg (10/24 0422) Last BM Date: 02/28/20 General:   Alert fatigued appearing female in NAD. Heart: RRR, no murmur.  Pulm:  Breath sounds clear throughout.  Abdomen: Obese abdomen, soft, nontender. + BS x 4 quads. Extremities:  Without edema. Neurologic:  Alert and  oriented x4;  grossly normal neurologically. Psych:  Alert and cooperative. Normal mood and affect.  Intake/Output from previous day: 10/23 0701 - 10/24 0700 In: 2140 [P.O.:1040; I.V.:1100] Out: 900 [Urine:900] Intake/Output this shift: Total I/O In: -  Out: 1 [Stool:1]  Lab Results: Recent Labs    02/27/20 0243 02/28/20 0410 02/29/20 0400  WBC 8.2 7.6 8.1  HGB 9.4* 10.1* 9.6*  HCT 29.1* 30.9* 29.3*  PLT 266 262 269   BMET Recent Labs    02/27/20 0243 02/28/20 0410 02/29/20 0400  NA 136 137 136  K 4.5 4.2 3.9  CL 106 104 99  CO2 21* 24 26  GLUCOSE 130* 114* 118*  BUN 27* 13 7*  CREATININE 1.48* 0.99 0.91  CALCIUM 8.4* 9.5 9.3   LFT Recent Labs    02/27/20 0243  PROT 5.7*  ALBUMIN 3.2*  AST 17  ALT 10  ALKPHOS 58  BILITOT 0.5   PT/INR No results for input(s): LABPROT, INR in the last 72 hours. Hepatitis Panel No results for input(s): HEPBSAG, HCVAB, HEPAIGM, HEPBIGM in the last 72 hours.  No results found.  Assessment / Plan:  1. Hematemesis. S/P EGD 10/23 showed a normal esophagus, gastritis. Mild nausea today. No further  vomiting.  -Continue Reglan 5mg  IV Q 6 hours for now -Zofran 4mg   IV Q 6hrs PRN -Advance to full liquid diet  2. Distal partial SBO vs ileus on non-contrast CTAP 10/21. CTAP with contrast ordered. -Await CTAP with contrast results  3. Macrocytic anemia. Hg 10.1 -> 9.6.  -CBC in am  4. GERD -PPI po bid   5. History of adenomatous colon polyps -4 mm tubular adenoma removed 2018  6. Unintentional weight loss, 20lbs. Await CTAP with contrast results.   Principal Problem:   Hematemesis with nausea Active Problems:   AKI (acute kidney injury) ([place])   Shock ([place])   Epigastric pain     LOS: 3 days   Noralyn Pick  02/29/2020, 4:02 PM  I have discussed the case with the PA, and that is the plan I formulated. I personally interviewed and examined the patient.  Clinically improved.  Upper endoscopy with some acute gastritis and friability resulting from the patient's protracted vomiting.  However, was not revealing for an apparent cause of the symptoms. Biopsy still pending to see if there is persistent H. pylori (treated years ago by Dr. Ardis Hughs)  Repeat CTAP with both oral and IV contrast for better quality images given patient's complaints of weight loss, query GI related.  CTAP ordered, our team will see her tomorrow.  If no major findings, hopefully  home tomorrow and advance diet and as needed antiemetics for follow-up with Dr. Ardis Hughs.  Nelida Meuse III Office: [phone]

## 2020-03-01 ENCOUNTER — Encounter (HOSPITAL_COMMUNITY): Payer: Self-pay | Admitting: Gastroenterology

## 2020-03-01 ENCOUNTER — Telehealth: Payer: Self-pay | Admitting: Gastroenterology

## 2020-03-01 ENCOUNTER — Telehealth: Payer: Self-pay

## 2020-03-01 DIAGNOSIS — K2970 Gastritis, unspecified, without bleeding: Secondary | ICD-10-CM | POA: Diagnosis not present

## 2020-03-01 DIAGNOSIS — K2990 Gastroduodenitis, unspecified, without bleeding: Secondary | ICD-10-CM

## 2020-03-01 DIAGNOSIS — K920 Hematemesis: Secondary | ICD-10-CM | POA: Diagnosis not present

## 2020-03-01 NOTE — TOC Transition Note (Signed)
Transition of Care [facility]) - CM/SW Discharge Note   Patient Details  Name: Lori [name] MRN: [id number] Date of Birth: [birth year]  [facility] of Care [facility] CM/SW Contact:  Zenon Mayo, RN Phone Number: 03/01/2020, 1:20 PM   Clinical Narrative:    Patient is for dc today, NCM spoke with patient, offered to assiste with finding PCP, she states her son is setting her up with Dr. Joelene Millin.  She states she has a walker and cane at home. She states she does not have an issue with transportation , but she has issues with getting food.  NCM gave her some food pantry resources.    Final next level of care: Home/Self Care Barriers to Discharge: No Barriers Identified   Patient Goals and CMS Choice Patient states their goals for this hospitalization and ongoing recovery are:: get better   Choice offered to / list presented to : NA  Discharge Placement                       Discharge Plan and Services                  DME Agency: NA       HH Arranged: NA          Social Determinants of Health (SDOH) Interventions     Readmission Risk Interventions No flowsheet data found.

## 2020-03-01 NOTE — Discharge Instructions (Signed)
Lori [name],  We recommend you follow up with your primary doctor to discuss your recent stay in the hospital. You will also need to follow up with Dr.Jacob the gastroenterologist in 4-6 weeks. We have held your blood pressure medication and you should discuss restarting this if you have high blood pressure in the future. Also we do not recommend taking Ambien and instead you should take Melatonin. In addition Xanax is not a recommended long term medication. Please discuss this further with your primary care doctor.   For you abdominal pain it is important you take the Protonix 40 mg twice a day. We will follow up with the results of your H.Pylori test.

## 2020-03-01 NOTE — Progress Notes (Incomplete)
   Subjective:   No acute events overnight  Feeling better today, just kind of cold. Reports that food doesn't taste like it used to. Denies abdominal pain.  States that she is going to be changing her PCP.   Discussed plan, she agrees. Ready to go home.  Objective:  Vital signs in last 24 hours: Vitals:   02/29/20 2025 02/29/20 2356 03/01/20 0500 03/01/20 0735  BP: (!) 179/74 (!) 128/52    Pulse: 76 81    Resp: 15 18    Temp: 98.9 F (37.2 C) 98.4 F (36.9 C)  98.7 F (37.1 C)  TempSrc: Oral Oral  Oral  SpO2: 98% 98%  99%  Weight:   84.1 kg   Height:        General: NAD, nl appearance Cardiovascular: Normal rate, regular rhythm.  No murmurs, rubs, or gallops Pulmonary : Effort normal, breath sounds normal. No wheezes, rales, or rhonchi Abdominal: soft, nontender,  bowel sounds present  BMP wnl CBC borderline macrocytic anemia , Hgb Stable    Assessment/Plan:  Principal Problem:   Hematemesis with nausea Active Problems:   AKI (acute kidney injury) ([place])   Shock (HCC)   Epigastric pain  [name] is a 89 yowith PMH of GERD, HTN, Depression, Arthritis, cocaine abuse who presents with abdominal pain and hematemesis.  Abdominal pain associated w/ Hematemesis -History of H.Pylori gastritis s/p treated in 2018 with Pylera.  EGD on 10/24, moderate inflammation in the stomach.  Biopsies were taken for H. pylori.  Esophagus and duodenum normal.  Will touch base with GI to see if there is need for repeat CT abdomen with contrast given patient is having normal bowel movements. -Hgb stable  -Stop IV Protonix, start p.o. Protonix 40 mg twice daily -Clear diet -Follow GI recommendations  Macrocytic anemia  B12 379 and Folate 10.1 , hemoglobin stable.  Continue to trend with daily CBC.  Mood disorder Patient has depression and takes Seroquel 25 QHS, Ambien 5mg   and Xanax 0.5mg  QID. Home medication list suggest patient has significant anxiety disorder.  -Restart  home medications Seroquel and reduce frequency of Xanax 3 times daily as needed.  We will hold Ambien and give patient ramelteon.  HTN Patient has hx of hypertension. Patient presented hypotensive and required levophed at presentation to ED. Home medication is lisinopril 20mg  - Will hold at this time.  Prior to Admission Living Arrangement: Home Anticipated Discharge Location: Home Barriers to Discharge: Treatment Dispo: Anticipated discharge in approximately 1-2 day(s).   Virl Axe, MD 03/01/2020, 7:50 AM Pager: [phone] After 5pm on weekdays and 1pm on weekends: On Call pager [phone]

## 2020-03-01 NOTE — Telephone Encounter (Signed)
See alternate note  

## 2020-03-01 NOTE — Progress Notes (Addendum)
Daily Rounding Note  03/01/2020, 10:06 AM  LOS: 4 days   SUBJECTIVE:   Chief complaint:  N/V, limited hematemesis.abd pain.     Feels much better, all GI sxs resolved.   TS team told her this AM that they feel she can go home.    OBJECTIVE:         Vital signs in last 24 hours:    Temp:  [98.4 F (36.9 C)-98.9 F (37.2 C)] 98.7 F (37.1 C) (10/25 0735) Pulse Rate:  [76-81] 81 (10/24 2356) Resp:  [15-18] 18 (10/24 2356) BP: (128-179)/(52-119) 128/52 (10/24 2356) SpO2:  [98 %-99 %] 99 % (10/25 0735) Weight:  [84.1 kg] 84.1 kg (10/25 0500) Last BM Date: 03/01/20 Filed Weights   02/28/20 0449 02/29/20 0422 03/01/20 0500  Weight: 83.8 kg 85 kg 84.1 kg   General: looks well    Heart: RRR Chest: clear bil.  Mucoid cough Abdomen: soft, NT, ND.  Active BS  Extremities: no CCE.   Neuro/Psych:  Oriented x 3.    Intake/Output from previous day: 10/24 0701 - 10/25 0700 In: -  Out: 1 [Stool:1]  Intake/Output this shift: No intake/output data recorded.  Lab Results: Recent Labs    02/28/20 0410 02/29/20 0400  WBC 7.6 8.1  HGB 10.1* 9.6*  HCT 30.9* 29.3*  PLT 262 269   BMET Recent Labs    02/28/20 0410 02/29/20 0400  NA 137 136  K 4.2 3.9  CL 104 99  CO2 24 26  GLUCOSE 114* 118*  BUN 13 7*  CREATININE 0.99 0.91  CALCIUM 9.5 9.3   LFT No results for input(s): PROT, ALBUMIN, AST, ALT, ALKPHOS, BILITOT, BILIDIR, IBILI in the last 72 hours. PT/INR No results for input(s): LABPROT, INR in the last 72 hours. Hepatitis Panel No results for input(s): HEPBSAG, HCVAB, HEPAIGM, HEPBIGM in the last 72 hours.  Studies/Results: CT ABDOMEN PELVIS W CONTRAST  Result Date: 02/29/2020 CLINICAL DATA:  Unintended weight loss. Noncontrast CT showing possible small bowel obstruction. Hematemesis. Nausea today. EXAM: CT ABDOMEN AND PELVIS WITH CONTRAST TECHNIQUE: Multidetector CT imaging of the abdomen and pelvis was  performed using the standard protocol following bolus administration of intravenous contrast. CONTRAST:  180mL OMNIPAQUE IOHEXOL 300 MG/ML  SOLN COMPARISON:  Abdominal CT 02/26/2020 FINDINGS: Lower chest: Linear atelectasis in the right lower lobe. No pleural fluid. Hepatobiliary: Borderline hepatic steatosis without focal hepatic lesion. Gallbladder physiologically distended, no calcified stone. No biliary dilatation. Pancreas: No ductal dilatation or inflammation. No evidence of pancreatic mass. Spleen: Normal in size without focal abnormality. Adrenals/Urinary Tract: Normal adrenal glands. No hydronephrosis or perinephric edema. Lobulated left renal contours with questionable scarring in the upper left kidney. Two ill-defined rounded lesions in the posterior mid left kidney larger lesion measuring 2.3 cm slightly higher density than simple fluid. Smaller lesion measuring 1.7 cm with Hounsfield units of 49. When compared with prior unenhanced CT there is no evidence of enhancement. This is been previously characterized on MRI as hemorrhagic cyst. There is no evidence of suspicious renal lesion. Symmetric excretion on delayed phase imaging. Air in the urinary bladder may be related to prior instrumentation. There is no bladder wall thickening. Stomach/Bowel: Decompressed stomach with equivocal gastric wall thickening about the greater curvature. Previous mildly dilated small bowel loops have resolved. Administered enteric contrast reaches the colon. There is no small bowel obstruction. The duodenum appears tortuous. Normal appendix. Portions of the colon are nondistended which limits detailed  assessment. There is no evidence of obvious colonic mass. Occasional descending colonic diverticula without diverticulitis. Vascular/Lymphatic: Moderate to advanced aorto bi-iliac atherosclerosis. Patent portal vein. No enlarged lymph nodes in the abdomen or pelvis. Reproductive: Post hysterectomy. Quiescent ovaries. No  adnexal mass. Other: No free air, free fluid, or intra-abdominal fluid collection. No body wall hernia. Musculoskeletal: Degenerative change in the lower lumbar spine. Grade 1 anterolisthesis of L4 on L5 likely facet mediated. No focal bone lesion. IMPRESSION: 1. No evidence of bowel obstruction. Previous mildly dilated small bowel loops have resolved. Enteric contrast reaches the colon. 2. Equivocal gastric wall thickening about the greater curvature, can be seen with gastritis. 3. Air in the urinary bladder may be related to prior instrumentation. No bladder wall thickening. 4. Minimal descending colonic diverticulosis without diverticulitis. 5. Left renal cysts, including a hemorrhagic cyst that was previously characterized with MRI. Aortic Atherosclerosis (ICD10-I70.0). Electronically Signed   By: Keith Rake M.D.   On: 02/29/2020 23:35    Scheduled Meds: . feeding supplement  1 Container Oral TID BM  . nicotine  21 mg Transdermal Daily  . pantoprazole  40 mg Oral BID  . QUEtiapine  25 mg Oral QHS  . ramelteon  8 mg Oral QHS   Continuous Infusions: . lactated ringers Stopped (03/01/20 0947)   PRN Meds:.acetaminophen, ALPRAZolam, benzocaine, guaiFENesin-dextromethorphan, ondansetron (ZOFRAN) IV   ASSESMENT:   *  Abd pain, N/V, wt loss.    10/24 CTAP w contrast: gastric wall thickening.  Resolve dilation of looops SB, uncomplicated colon tics, renal cysts w Hemorrhagic renal cyst.  No real source for above sxs.   Previous H Pylori tx.   EGD 10/23: Gastritis.  Biopsies pndg.   ? taking Protonix 40 mg/daily PTA.  Now on Protonix 40 mg po bid.    *   Macrocytic anemia.  Folate, B12 well wnl.    *   AKI, dehydration.  Improved.     PLAN   *  Continue Protonix 40 mg po bid.  fup w Dr Ardis Hughs in 4 to 6 weeks.   PMD can check CBC in 1 to 2 weeks    Lori [name]  03/01/2020, 10:06 AM Phone [place] Attending   I have taken an interval history, reviewed the  chart and examined the patient. I agree with the Advanced Practitioner's note, impression and recommendations.   Signing off  Gatha Mayer, MD, [facility] 03/01/2020 1:36 PM

## 2020-03-01 NOTE — Telephone Encounter (Signed)
-----   Message from Milus Banister, MD sent at 03/01/2020 12:03 PM EDT ----- Regarding: RE: fup Certainly,  Tayte Mcwherter, She needs office visit with me or extender in 4 to 6 weeks for hospital follow-up.  Thanks  ----- Message ----- From: Clearence Cheek Sent: 03/01/2020  10:34 AM EDT To: Milus Banister, MD Subject: fup                                            Can you arrange ROV for 4 to 6 weeks for n/v, abd pain, gastritis, macrocytic anemia?   Thanks, S

## 2020-03-01 NOTE — Telephone Encounter (Signed)
04/21/20 at 930 am appt with Dr Ardis Hughs.  No answer no voice mail

## 2020-03-01 NOTE — Progress Notes (Signed)
D/C instructions given and reviewed. Questions answered and encouraged to call with any more concerns. Tele removed.

## 2020-03-01 NOTE — Progress Notes (Addendum)
   Subjective: Patient reports she is doing well this a.m. and feels ready to go home.  Patient reports eating well and is no longer having abdominal pain nor hematemesis.  Objective:  Vital signs in last 24 hours: Vitals:   02/29/20 2025 02/29/20 2356 03/01/20 0500 03/01/20 0735  BP: (!) 179/74 (!) 128/52    Pulse: 76 81    Resp: 15 18    Temp: 98.9 F (37.2 C) 98.4 F (36.9 C)  98.7 F (37.1 C)  TempSrc: Oral Oral  Oral  SpO2: 98% 98%  99%  Weight:   84.1 kg   Height:       Physical Exam Constitutional:      Appearance: She is well-developed. She is not ill-appearing.  Cardiovascular:     Rate and Rhythm: Normal rate and regular rhythm.     Heart sounds: Murmur heard.      Comments: Murmur is loudest at left fifth intercostal Abdominal:     General: Abdomen is flat.     Palpations: Abdomen is soft.     Tenderness: There is no abdominal tenderness.  Skin:    General: Skin is warm and dry.     Capillary Refill: Capillary refill takes less than 2 seconds.     Comments: Right hand is swollen s/p IV removal, not tense,  bilateral capillary refill less than 2 seconds  Neurological:     General: No focal deficit present.     Mental Status: She is alert.     Assessment/Plan:  Principal Problem:   Hematemesis with nausea Active Problems:   AKI (acute kidney injury) ([place])   Shock (H[facility])   Epigastric pain  [name] is a 6 yowith PMH of GERD, HTN, Depression, Arthritis, cocaine abuse who presents with abdominal pain and hematemesis.  Abdominal pain associated w/ Hematemesis Patient reporting no abdominal pain this a.m.  History of H.Pylori gastritis s/p treated in 2018 with Pylera.  EGD on 10/24, moderate inflammation in the stomach.  Biopsies were taken for H. pylori.  Remain pending.   CTAP with contrast noted gastric wall thickening and resolution of dilation of small bowel loops, no evidence of bowel obstruction.  GI recommends Protonix 40 mg p.o. twice daily  and follow-up with Dr. Ardis Hughs in 4-6 weeks. -Hgb stable  - p.o. Protonix 40 mg twice daily -Full liquid diet -Follow GI recommendations - GI discontinue Reglan  Macrocytic anemia  B12 379 and Folate 10.1 , hemoglobin stable.  Continue to trend with daily CBC.  P.M. Hgb 10/24 8.1. -Recommend CBC check in 1-2 weeks OP  Mood disorder Patient has depression and takes Seroquel 25 QHS, Ambien 5mg  and Xanax 0.5mg  QID. Home medication list suggest patient has significant anxiety disorder.  -Restart home medications Seroquel and reduce frequency of Xanax 3 times daily as needed.  We will hold Ambien and give patient ramelteon.  HTN Patient has hx of hypertension. Patient presented hypotensive and required levophed at presentation to ED. A.M. [id number].  Home medication is lisinopril 20mg  - Will hold at this time.  Prior to Admission Living Arrangement: Home Anticipated Discharge Location: Home Barriers to Discharge: Treatment Dispo: Anticipated discharge in approximately 1-2 day(s).   Freida Busman, MD 03/01/2020, 11:21 AM Pager: [phone] After 5pm on weekdays and 1pm on weekends: On Call pager [phone]

## 2020-03-02 ENCOUNTER — Other Ambulatory Visit: Payer: Self-pay | Admitting: Physician Assistant

## 2020-03-02 LAB — CULTURE, BLOOD (ROUTINE X 2)
Culture: NO GROWTH
Culture: NO GROWTH
Special Requests: ADEQUATE
Special Requests: ADEQUATE

## 2020-03-02 LAB — SURGICAL PATHOLOGY

## 2020-03-02 NOTE — Telephone Encounter (Signed)
Unable to reach pt by phone will mail letter

## 2020-03-03 NOTE — Discharge Summary (Addendum)
Name: Lori [name] MRN: [id number] DOB: 1955/12/30 64 y.o. PCP: Alvester Chou, NP  Date of Admission: 02/26/2020  2:38 PM Date of Discharge: 03/01/2020 Attending Physician: Dr. Lenice Pressman  Discharge Diagnosis: 1. Principal Problem:   Hematemesis with nausea Active Problems:   AKI (acute kidney injury) ([place])   Shock ([place])   Epigastric pain   Discharge Medications: Allergies as of 03/01/2020   No Known Allergies      Medication List     STOP taking these medications    Colchicine 0.6 MG Caps   diclofenac Sodium 1 % Gel Commonly known as: Voltaren   diphenhydramine-acetaminophen 25-500 MG Tabs tablet Commonly known as: TYLENOL PM   lisinopril 20 MG tablet Commonly known as: ZESTRIL   ondansetron 4 MG tablet Commonly known as: ZOFRAN   sucralfate 1 g tablet Commonly known as: Carafate   sucralfate 1 GM/10ML suspension Commonly known as: CARAFATE   traMADol 50 MG tablet Commonly known as: ULTRAM   zolpidem 5 MG tablet Commonly known as: AMBIEN       TAKE these medications    acetaminophen 325 MG tablet Commonly known as: Tylenol Take 2 tablets (650 mg total) by mouth every 6 (six) hours as needed. What changed: Another medication with the same name was removed. Continue taking this medication, and follow the directions you see here.   albuterol 108 (90 Base) MCG/ACT inhaler Commonly known as: VENTOLIN HFA Inhale 1-2 puffs into the lungs every 6 (six) hours as needed for wheezing or shortness of breath.   ALPRAZolam 0.5 MG tablet Commonly known as: XANAX Take 0.5 mg by mouth in the morning, at noon, in the evening, and at bedtime.   gabapentin 300 MG capsule Commonly known as: NEURONTIN TAKE 1 CAPSULE BY MOUTH EVERYDAY AT BEDTIME What changed: See the new instructions.   pantoprazole 40 MG tablet Commonly known as: PROTONIX Take 1 tablet (40 mg total) by mouth 2 (two) times daily. What changed: when to take this   QUEtiapine 25  MG tablet Commonly known as: SEROQUEL Take 25 mg by mouth at bedtime.        Disposition and follow-up:   Lori [name] was discharged from [facility] in Stable condition.  At the hospital follow up visit please address:  1.  Please consider reassess patient macrocytic anemia - Please reassess patient gastritis and abdominal pain  2.  Labs / imaging needed at time of follow-up: CBC  3.  Pending labs/ test needing follow-up: H. pylori  Follow-up Appointments:  Follow-up Information     [facility] Follow up in 4 week(s).   Specialty: Gastroenterology Contact information: [place] [address] [phone]        Alvester Chou, NP.   Specialty: Nurse Practitioner Why: The patient has appointment scheduled Contact information: [facility] [address] [place] [address] [phone]                 Hospital Course by problem list: Lori [name] is a 64 yo with PMH of GERD, HTN, Depression, Arthritis, cocaine abuse who presents with abdominal pain and hematemesis.   Abdominal pain associated w/ Hematemesis Initially there was concern for SBO and patient was seen by Gen surgery and received NG tube. Patient received mIVF as she was NPO. CT abdomen pelvis did not show signs of SBO and patient reported recent BM prior to admission. NG tube was removed and patient  was started on diet. [place] of Abdomen was not significant. Patient also had IV Ppx and transitioned to PO PPI with introduction of diet. GI was consulted due to report of hematemesis and description concerning for gastric vs duodenal ulcers. GI did EGD which noted gastritis and samples were taken for H pylori, but no ulcerations were seen. Patient symptoms improved by discharge and sent home with PPI rx.   Macrocytic anemia Patient B12 379 and Folate 10.1, suggested workup outpatient. Patient Hgb stabilized.     Mood disorder Patient was restarted on her Seroquel and Xanax once stable. Patient Xanax was made TID PRN. Patient Ambien was held and was not recommended that this be restarted at discharge. Patient was provided ramelteon inpatient.   Hypovolemic shock- resolved Patient presented with signs of hypotensive shock. Patient received 3L fluid resuscitation and briefly required norepinephrine before being weaned off. Patient was started on cefepime and flagyl that was discontinued as patient vitals and LA stabilized and returned normal with fluid resuscitation. Bcx had no growth. Likely hypovolemic shock.    AKI-resolved Patient presented with renal failure in the setting of hypovolemic shock. Baseline Cr appears to be around 1.0. Prerenal AKI improved with IV fluids, creatinine 0.99.  Discharge Vitals:   BP 128/83 (BP Location: Left Arm)   Pulse 81   Temp 98.3 F (36.8 C) (Oral)   Resp 15   Ht 5\' 2"  (1.575 m)   Wt 84.1 kg   SpO2 98%   BMI 33.89 kg/m   Pertinent Labs, Studies, and Procedures:  02/28/2020- Upper endoscopy: significant for gastritis otherwise normal esophagus and stomach  CT Abdomen Pelvis Wo Contrast  Result Date: 02/26/2020 CLINICAL DATA:  Abdominal pain. EXAM: CT ABDOMEN AND PELVIS WITHOUT CONTRAST TECHNIQUE: Multidetector CT imaging of the abdomen and pelvis was performed following the standard protocol without IV contrast. COMPARISON:  November 27, 2017 FINDINGS: Lower chest: No acute abnormality. Hepatobiliary: No focal liver abnormality is seen. No gallstones, gallbladder wall thickening, or biliary dilatation. Pancreas: Unremarkable. No pancreatic ductal dilatation or surrounding inflammatory changes. Spleen: Normal in size without focal abnormality. Adrenals/Urinary Tract: Adrenal glands are unremarkable. Kidneys are normal in size, without renal calculi or hydronephrosis. Stable, adjacent, 2.1 cm x 2.0 cm and 1.5 cm x 1.2 cm exophytic left renal cysts are seen. Bladder is  unremarkable. Stomach/Bowel: There is a small hiatal hernia. Appendix appears normal. Mildly dilated small bowel loops are seen within the lower abdomen (maximum small bowel diameter of approximately 3.0 cm). A clear transition zone is not identified. Noninflamed diverticula are seen within the sigmoid colon. Vascular/Lymphatic: There is marked severity calcification of the common iliac arteries, without evidence of aneurysmal dilatation. No enlarged abdominal or pelvic lymph nodes. Reproductive: Status post hysterectomy. No adnexal masses. Other: No abdominal wall hernia or abnormality. No abdominopelvic ascites. Musculoskeletal: Degenerative changes seen within the lumbar spine, most prominent at the level of L5-S1. IMPRESSION: 1. Findings consistent with a mild ileus versus distal partial small bowel obstruction. 2. Sigmoid diverticulosis. 3. Small hiatal hernia. 4. Stable left renal cysts. 5. Aortic atherosclerosis. Aortic Atherosclerosis (ICD10-I70.0). Electronically Signed   By: Virgina Norfolk M.D.   On: 02/26/2020 15:30   CT ABDOMEN PELVIS W CONTRAST  Result Date: 02/29/2020 CLINICAL DATA:  Unintended weight loss. Noncontrast CT showing possible small bowel obstruction. Hematemesis. Nausea today. EXAM: CT ABDOMEN AND PELVIS WITH CONTRAST TECHNIQUE: Multidetector CT imaging of the abdomen and pelvis was performed using the standard protocol following bolus administration of intravenous contrast. CONTRAST:  152mL OMNIPAQUE IOHEXOL 300 MG/ML  SOLN COMPARISON:  Abdominal CT 02/26/2020 FINDINGS: Lower chest: Linear atelectasis in the right lower lobe. No pleural fluid. Hepatobiliary: Borderline hepatic steatosis without focal hepatic lesion. Gallbladder physiologically distended, no calcified stone. No biliary dilatation. Pancreas: No ductal dilatation or inflammation. No evidence of pancreatic mass. Spleen: Normal in size without focal abnormality. Adrenals/Urinary Tract: Normal adrenal glands. No  hydronephrosis or perinephric edema. Lobulated left renal contours with questionable scarring in the upper left kidney. Two ill-defined rounded lesions in the posterior mid left kidney larger lesion measuring 2.3 cm slightly higher density than simple fluid. Smaller lesion measuring 1.7 cm with Hounsfield units of 49. When compared with prior unenhanced CT there is no evidence of enhancement. This is been previously characterized on MRI as hemorrhagic cyst. There is no evidence of suspicious renal lesion. Symmetric excretion on delayed phase imaging. Air in the urinary bladder may be related to prior instrumentation. There is no bladder wall thickening. Stomach/Bowel: Decompressed stomach with equivocal gastric wall thickening about the greater curvature. Previous mildly dilated small bowel loops have resolved. Administered enteric contrast reaches the colon. There is no small bowel obstruction. The duodenum appears tortuous. Normal appendix. Portions of the colon are nondistended which limits detailed assessment. There is no evidence of obvious colonic mass. Occasional descending colonic diverticula without diverticulitis. Vascular/Lymphatic: Moderate to advanced aorto bi-iliac atherosclerosis. Patent portal vein. No enlarged lymph nodes in the abdomen or pelvis. Reproductive: Post hysterectomy. Quiescent ovaries. No adnexal mass. Other: No free air, free fluid, or intra-abdominal fluid collection. No body wall hernia. Musculoskeletal: Degenerative change in the lower lumbar spine. Grade 1 anterolisthesis of L4 on L5 likely facet mediated. No focal bone lesion. IMPRESSION: 1. No evidence of bowel obstruction. Previous mildly dilated small bowel loops have resolved. Enteric contrast reaches the colon. 2. Equivocal gastric wall thickening about the greater curvature, can be seen with gastritis. 3. Air in the urinary bladder may be related to prior instrumentation. No bladder wall thickening. 4. Minimal descending  colonic diverticulosis without diverticulitis. 5. Left renal cysts, including a hemorrhagic cyst that was previously characterized with MRI. Aortic Atherosclerosis (ICD10-I70.0). Electronically Signed   By: Keith Rake M.D.   On: 02/29/2020 23:35   DG Chest Port 1 View  Result Date: 02/26/2020 CLINICAL DATA:  Shortness of breath. EXAM: PORTABLE CHEST 1 VIEW COMPARISON:  November 27, 2017. FINDINGS: The heart size and mediastinal contours are within normal limits. No pneumothorax or pleural effusion is noted. Minimal bibasilar subsegmental atelectasis or scarring is noted. The visualized skeletal structures are unremarkable. IMPRESSION: Minimal bibasilar subsegmental atelectasis or scarring. Aortic Atherosclerosis (ICD10-I70.0). Electronically Signed   By: Marijo Conception M.D.   On: 02/26/2020 15:18    US Abdomen Limited RUQ (LIVER/GB)  Result Date: 02/26/2020 CLINICAL DATA:  Right upper quadrant pain EXAM: ULTRASOUND ABDOMEN LIMITED RIGHT UPPER QUADRANT COMPARISON:  CT 02/26/2020, ultrasound 01/08/2018 FINDINGS: Gallbladder: No gallstones or wall thickening visualized. No sonographic Murphy sign noted by sonographer. Common bile duct: Diameter: 3 mm Liver: No focal lesion identified. Within normal limits in parenchymal echogenicity. Portal vein is patent on color Doppler imaging with normal direction of blood flow towards the liver. Other: None. IMPRESSION: Negative right upper quadrant abdominal ultrasound Electronically Signed   By: Donavan Foil M.D.   On: 02/26/2020 17:47    Discharge Instructions: Discharge Instructions     Diet - low sodium heart healthy   Complete by: As directed    Increase activity slowly  Complete by: As directed        Signed: Freida Busman, MD 03/03/2020, 11:14 AM   Pager: [phone]

## 2020-03-05 ENCOUNTER — Other Ambulatory Visit: Payer: Medicaid Other

## 2020-03-08 NOTE — Telephone Encounter (Signed)
Hi Patty!  Patient has a scheduled appt for 04/21/20 with Dr. Ardis Hughs.. Thanks

## 2020-03-11 ENCOUNTER — Telehealth: Payer: Self-pay | Admitting: Gastroenterology

## 2020-03-11 NOTE — Telephone Encounter (Signed)
Pt last saw Amy Thanks

## 2020-03-12 NOTE — Telephone Encounter (Signed)
Patient was cared for by Dr Loletha Carrow in the [facility]. She was calling in response to the letter sent by [facility], RN. I relayed the information of the biopsy. Patient said she was told this while she was in the hospital. Thanked me for the return call. She confirmed she will see Dr Ardis Hughs on 04/21/20 at 9:30 am.

## 2020-03-12 NOTE — Telephone Encounter (Signed)
Thank you Beth

## 2020-03-19 ENCOUNTER — Telehealth: Payer: Self-pay | Admitting: Gastroenterology

## 2020-03-19 NOTE — Telephone Encounter (Signed)
Patient called states her iron levels are really low and is requesting to speak with nurse

## 2020-03-19 NOTE — Telephone Encounter (Signed)
Returned call to the pt and no answer and voice mail not set up

## 2020-03-22 ENCOUNTER — Other Ambulatory Visit: Payer: Medicaid Other

## 2020-03-22 NOTE — Telephone Encounter (Signed)
Returned pt call no answer and voicemail not set up.  Will await further communication from the pt.

## 2020-04-07 ENCOUNTER — Telehealth: Payer: Self-pay | Admitting: Gastroenterology

## 2020-04-07 NOTE — Telephone Encounter (Signed)
Tried to return the call and the line goes straight to a message that states voice mail not set up.

## 2020-04-07 NOTE — Telephone Encounter (Signed)
Pt is requesting a call back from a nurse, pt states she is experiencing hot/cold sweats, exhaustion, nausea, pt would like some advice on what she can do.

## 2020-04-08 ENCOUNTER — Other Ambulatory Visit: Payer: Self-pay | Admitting: Neurosurgery

## 2020-04-08 NOTE — Telephone Encounter (Signed)
Tried to call pt on several occasions and the line goes straight to a message that states no voice mail and hangs up will wait for further communication from the pt.

## 2020-04-12 ENCOUNTER — Ambulatory Visit
Admission: RE | Admit: 2020-04-12 | Discharge: 2020-04-12 | Disposition: A | Discharge status: Home / Self Care | Payer: Medicaid Other | Source: Ambulatory Visit | Attending: Neurosurgery | Admitting: Neurosurgery

## 2020-04-12 DIAGNOSIS — M4316 Spondylolisthesis, lumbar region: Secondary | ICD-10-CM

## 2020-04-21 ENCOUNTER — Ambulatory Visit: Payer: Medicaid Other | Admitting: Gastroenterology

## 2020-05-13 NOTE — Telephone Encounter (Signed)
Tried again to reach out to the pt and the message states that voice mail has not been set up yet.  I will await further communication from the pt.    Can you please confirm the number if the pt calls back.  Thank you

## 2020-05-13 NOTE — Telephone Encounter (Signed)
Inbound call from patient stating she is sill not feeling well and requesting a call back please.  Can be reached at [phone].

## 2020-05-18 ENCOUNTER — Other Ambulatory Visit: Payer: Medicaid Other

## 2020-05-18 ENCOUNTER — Other Ambulatory Visit: Payer: Self-pay

## 2020-05-18 DIAGNOSIS — Z20822 Contact with and (suspected) exposure to covid-19: Secondary | ICD-10-CM

## 2020-05-22 LAB — NOVEL CORONAVIRUS, NAA: SARS-CoV-2, NAA: NOT DETECTED

## 2020-05-23 ENCOUNTER — Telehealth: Payer: Self-pay

## 2020-05-23 NOTE — Telephone Encounter (Signed)
Called and informed patient that test for Covid 19 was NEGATIVE. Discussed signs and symptoms of Covid 19 : fever, chills, respiratory symptoms, cough, ENT symptoms, sore throat, SOB, muscle pain, diarrhea, headache, loss of taste/smell, close exposure to COVID-19 patient. Pt instructed to call PCP if they develop the above signs and sx. Pt also instructed to call 911 if having respiratory issues/distress.  Pt's sister verbalized understanding.

## 2020-06-18 ENCOUNTER — Telehealth: Payer: Self-pay | Admitting: Gastroenterology

## 2020-06-18 NOTE — Telephone Encounter (Signed)
Line rings then goes to busy

## 2020-06-18 NOTE — Telephone Encounter (Signed)
Patient called said she is feeling really bad has no appetite thinks she is having issues with pancreas again and is requesting to speak with a nurse.

## 2020-06-21 NOTE — Telephone Encounter (Signed)
Tried again to reach the pt and the line goes straight to a message that says voice mail has not been set up.  Will await for further communication from the pt.

## 2020-08-10 ENCOUNTER — Ambulatory Visit: Payer: Medicaid Other | Admitting: Gastroenterology

## 2020-09-29 ENCOUNTER — Other Ambulatory Visit: Payer: Self-pay | Admitting: Orthopedic Surgery

## 2020-09-29 DIAGNOSIS — M79671 Pain in right foot: Secondary | ICD-10-CM

## 2020-10-29 ENCOUNTER — Other Ambulatory Visit: Payer: Self-pay | Admitting: Family Medicine

## 2020-10-29 ENCOUNTER — Ambulatory Visit
Admission: RE | Admit: 2020-10-29 | Discharge: 2020-10-29 | Disposition: A | Discharge status: Home / Self Care | Payer: Medicaid Other | Source: Ambulatory Visit | Attending: Family Medicine | Admitting: Family Medicine

## 2020-10-29 DIAGNOSIS — R0602 Shortness of breath: Secondary | ICD-10-CM

## 2020-10-29 DIAGNOSIS — F17200 Nicotine dependence, unspecified, uncomplicated: Secondary | ICD-10-CM

## 2020-11-17 ENCOUNTER — Other Ambulatory Visit: Payer: Self-pay | Admitting: Family Medicine

## 2020-11-17 DIAGNOSIS — R946 Abnormal results of thyroid function studies: Secondary | ICD-10-CM

## 2021-02-03 ENCOUNTER — Emergency Department (HOSPITAL_COMMUNITY): Payer: Medicaid Other

## 2021-02-03 ENCOUNTER — Emergency Department (HOSPITAL_COMMUNITY)
Admission: EM | Admit: 2021-02-03 | Discharge: 2021-02-03 | Disposition: A | Discharge status: Home / Self Care | Payer: Medicaid Other | Attending: Emergency Medicine | Admitting: Emergency Medicine

## 2021-02-03 ENCOUNTER — Encounter (HOSPITAL_COMMUNITY): Payer: Self-pay | Admitting: Emergency Medicine

## 2021-02-03 DIAGNOSIS — Z79899 Other long term (current) drug therapy: Secondary | ICD-10-CM | POA: Diagnosis not present

## 2021-02-03 DIAGNOSIS — I748 Embolism and thrombosis of other arteries: Secondary | ICD-10-CM | POA: Diagnosis not present

## 2021-02-03 DIAGNOSIS — M79604 Pain in right leg: Secondary | ICD-10-CM | POA: Diagnosis not present

## 2021-02-03 DIAGNOSIS — F17210 Nicotine dependence, cigarettes, uncomplicated: Secondary | ICD-10-CM | POA: Diagnosis not present

## 2021-02-03 DIAGNOSIS — M542 Cervicalgia: Secondary | ICD-10-CM | POA: Diagnosis not present

## 2021-02-03 DIAGNOSIS — R519 Headache, unspecified: Secondary | ICD-10-CM | POA: Diagnosis present

## 2021-02-03 DIAGNOSIS — I7789 Other specified disorders of arteries and arterioles: Secondary | ICD-10-CM | POA: Diagnosis not present

## 2021-02-03 DIAGNOSIS — Z85038 Personal history of other malignant neoplasm of large intestine: Secondary | ICD-10-CM | POA: Insufficient documentation

## 2021-02-03 DIAGNOSIS — Z8541 Personal history of malignant neoplasm of cervix uteri: Secondary | ICD-10-CM | POA: Insufficient documentation

## 2021-02-03 DIAGNOSIS — M79605 Pain in left leg: Secondary | ICD-10-CM | POA: Diagnosis not present

## 2021-02-03 DIAGNOSIS — R000 Tachycardia, unspecified: Secondary | ICD-10-CM | POA: Diagnosis not present

## 2021-02-03 DIAGNOSIS — I779 Disorder of arteries and arterioles, unspecified: Secondary | ICD-10-CM

## 2021-02-03 DIAGNOSIS — I10 Essential (primary) hypertension: Secondary | ICD-10-CM | POA: Diagnosis not present

## 2021-02-03 LAB — CBC WITH DIFFERENTIAL/PLATELET
Abs Immature Granulocytes: 0.03 10*3/uL (ref 0.00–0.07)
Basophils Absolute: 0 10*3/uL (ref 0.0–0.1)
Basophils Relative: 0 %
Eosinophils Absolute: 0.1 10*3/uL (ref 0.0–0.5)
Eosinophils Relative: 1 %
HCT: 32.5 % — ABNORMAL LOW (ref 36.0–46.0)
Hemoglobin: 10.4 g/dL — ABNORMAL LOW (ref 12.0–15.0)
Immature Granulocytes: 0 %
Lymphocytes Relative: 24 %
Lymphs Abs: 2.7 10*3/uL (ref 0.7–4.0)
MCH: 32.7 pg (ref 26.0–34.0)
MCHC: 32 g/dL (ref 30.0–36.0)
MCV: 102.2 fL — ABNORMAL HIGH (ref 80.0–100.0)
Monocytes Absolute: 0.7 10*3/uL (ref 0.1–1.0)
Monocytes Relative: 6 %
Neutro Abs: 7.8 10*3/uL — ABNORMAL HIGH (ref 1.7–7.7)
Neutrophils Relative %: 69 %
Platelets: 399 10*3/uL (ref 150–400)
RBC: 3.18 MIL/uL — ABNORMAL LOW (ref 3.87–5.11)
RDW: 13.5 % (ref 11.5–15.5)
WBC: 11.4 10*3/uL — ABNORMAL HIGH (ref 4.0–10.5)
nRBC: 0 % (ref 0.0–0.2)

## 2021-02-03 LAB — COMPREHENSIVE METABOLIC PANEL
ALT: 13 U/L (ref 0–44)
AST: 15 U/L (ref 15–41)
Albumin: 3.4 g/dL — ABNORMAL LOW (ref 3.5–5.0)
Alkaline Phosphatase: 89 U/L (ref 38–126)
Anion gap: 12 (ref 5–15)
BUN: 12 mg/dL (ref 8–23)
CO2: 21 mmol/L — ABNORMAL LOW (ref 22–32)
Calcium: 9.7 mg/dL (ref 8.9–10.3)
Chloride: 102 mmol/L (ref 98–111)
Creatinine, Ser: 1.06 mg/dL — ABNORMAL HIGH (ref 0.44–1.00)
GFR, Estimated: 59 mL/min — ABNORMAL LOW (ref >60)
Glucose, Bld: 127 mg/dL — ABNORMAL HIGH (ref 70–99)
Potassium: 4.2 mmol/L (ref 3.5–5.1)
Sodium: 135 mmol/L (ref 135–145)
Total Bilirubin: 0.7 mg/dL (ref 0.3–1.2)
Total Protein: 7.3 g/dL (ref 6.5–8.1)

## 2021-02-03 LAB — LACTIC ACID, PLASMA: Lactic Acid, Venous: 1.6 mmol/L (ref 0.5–1.9)

## 2021-02-03 MED ORDER — IOHEXOL 350 MG/ML SOLN
80.0000 mL | Freq: Once | INTRAVENOUS | Status: AC | PRN
  Administered 2021-02-03: 80 mL via INTRAVENOUS

## 2021-02-03 MED ORDER — KETOROLAC TROMETHAMINE 15 MG/ML IJ SOLN
15.0000 mg | Freq: Once | INTRAMUSCULAR | Status: AC
  Administered 2021-02-03: 15 mg via INTRAVENOUS
  Filled 2021-02-03: qty 1

## 2021-02-03 MED ORDER — SODIUM CHLORIDE 0.9 % IV BOLUS
1000.0000 mL | Freq: Once | INTRAVENOUS | Status: AC
  Administered 2021-02-03: 1000 mL via INTRAVENOUS

## 2021-02-03 MED ORDER — PROCHLORPERAZINE EDISYLATE 10 MG/2ML IJ SOLN
10.0000 mg | Freq: Once | INTRAMUSCULAR | Status: AC
  Administered 2021-02-03: 10 mg via INTRAVENOUS
  Filled 2021-02-03: qty 2

## 2021-02-03 MED ORDER — DIPHENHYDRAMINE HCL 50 MG/ML IJ SOLN
25.0000 mg | Freq: Once | INTRAMUSCULAR | Status: AC
  Administered 2021-02-03: 25 mg via INTRAVENOUS
  Filled 2021-02-03: qty 1

## 2021-02-03 MED ORDER — ACETAMINOPHEN 500 MG PO TABS
1000.0000 mg | ORAL_TABLET | Freq: Once | ORAL | Status: AC
  Administered 2021-02-03: 1000 mg via ORAL
  Filled 2021-02-03: qty 2

## 2021-02-03 NOTE — Discharge Instructions (Addendum)
Please follow up with Cardiology for clots we discovered in the ER and see your primary care doctor for further management.   Start taking 81 mg aspirin every day. Refrain from smoking.

## 2021-02-03 NOTE — ED Triage Notes (Signed)
Pt endorses headache and soreness for 2 days. Also started having bilateral leg pain today that is making it hard to walk. Also states that she had tongue swelling 4 days ago but that has improved.

## 2021-02-03 NOTE — ED Notes (Signed)
Pt c/o neck pain that is worsened with movement, A&O x 4, no facial droop, weakness noted. Moves all extremities well.

## 2021-02-03 NOTE — ED Provider Notes (Signed)
[facility] EMERGENCY DEPARTMENT Provider Note   CSN: [id number] Arrival date & time: 02/03/21  0957     History Chief Complaint  Patient presents with   Headache   Leg Pain    Lori [name] is a 65 y.o. female.  HPI    Pt has hx of HTN, remote hx of uterine CA and cocaine CA.  65 y/o F comes in with cc of headache.  Headache started about a day and a half ago, it is located in the front and radiates to the neck area.  The pain is constant, worse with movement of her neck, lights and better when she is laying still.  No history of similar headaches.  Headache is moderate to severe and she is also having some difficulty remembering things associated with it.  Review of system is negative for any new numbness, tingling, weakness, vision change, slurred speech.  She is also complaining of bilateral leg pain and states that the pain originates in her back and radiates down the leg.  She is having difficulty walking because of pain in her left leg.  She denies any urinary incontinence, urinary retention, bilateral lower extremity weakness.  Back pain and leg weakness has been present for 6 months.  Social history is positive for pack a day smoking, she started smoking when she was 65 years old.  She also used to cocaine which she quit 6 years back.  Past Medical History:  Diagnosis Date   Arthritis    Depression    Depression with anxiety July 30, 2008   Patient had multiple death in her family over a short period of time. father, brother, uncle and niece who was stabbed 3 years ago. Patient's husband had a car accident last year and  need currently a feeding tube.    GERD (gastroesophageal reflux disease)    Gout    Headache(784.0)    migraines   History of cervical cancer  1985    status post partial hysterectomy , last Pap smear 10 years ago , no further followup   History of cocaine abuse ([place]) 10/23/2008    last documented in 07-30-2005 when admitted for hypertensive  urgency   Hypertension 2005/07/30   Admitted for HTN crisis in 10/2008. Was given in 2008/07/30 a wrong prescription from the pharmacy and per ED note it was Lisinopril-Hydrocholorthizide which gave her a  hives and feeling of sickness. Patient was started  on this meds on 05/31/2010 by Dr  Ihor Gully without any problem.    Insomnia    Tobacco abuse     35 years   Uterine cancer [facility])     Patient Active Problem List   Diagnosis Date Noted   Hematemesis with nausea 02/27/2020   Epigastric pain 02/27/2020   AKI (acute kidney injury) ([place])    Shock ([place])    Hx of adenomatous colonic polyps    Benign neoplasm of transverse colon    Bloating    Gastritis and gastroduodenitis    Bilateral foot pain 01/15/2017   Achilles tendon contracture, bilateral 01/15/2017   Normocytic anemia 09/12/2016   Hyponatremia 09/12/2016   Acute pancreatitis 09/11/2016   Shin splints, sequela 08/16/2016   Cellulitis of groin 01/05/2016   Encounter for screening mammogram for breast cancer 07/02/2015   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   Allergic rhinitis 11/27/2014   Otitis media of right ear 07/01/2012   Cough 11/20/2011   Renal lesion 06/12/2011   Nausea 05/10/2011   Caries  04/12/2011   Abdominal pain 10/25/2010   Preventive measure 10/25/2010   Shoulder pain, left 10/25/2010   Leg pain 06/20/2010   CERVICAL CANCER 10/23/2008   TOBACCO ABUSE 10/23/2008   Depression with anxiety 10/23/2008   Essential hypertension 10/23/2008   GERD 10/23/2008   Insomnia 10/23/2008   SNORING 10/23/2008   History of cervical cancer 1985    Past Surgical History:  Procedure Laterality Date   BIOPSY  02/28/2020   Procedure: BIOPSY;  Surgeon: Doran Stabler, MD;  Location: [facility] ENDOSCOPY;  Service: Gastroenterology;;   [place]   COLONOSCOPY WITH PROPOFOL N/A 03/01/2017   Procedure: COLONOSCOPY WITH PROPOFOL;  Surgeon: Milus Banister, MD;  Location: [facility] ENDOSCOPY;  Service: Endoscopy;  Laterality: N/A;    ESOPHAGOGASTRODUODENOSCOPY (EGD) WITH PROPOFOL N/A 03/01/2017   Procedure: ESOPHAGOGASTRODUODENOSCOPY (EGD) WITH PROPOFOL;  Surgeon: Milus Banister, MD;  Location: [facility] ENDOSCOPY;  Service: Endoscopy;  Laterality: N/A;   ESOPHAGOGASTRODUODENOSCOPY (EGD) WITH PROPOFOL N/A 02/28/2020   Procedure: ESOPHAGOGASTRODUODENOSCOPY (EGD) WITH PROPOFOL;  Surgeon: Doran Stabler, MD;  Location: [place];  Service: Gastroenterology;  Laterality: N/A;   PARTIAL HYSTERECTOMY     SKIN GRAFT       OB History   No obstetric history on file.     Family History  Problem Relation Age of Onset   Thyroid disease Mother    Diabetes Father    Heart disease Father    Hyperlipidemia Father    Hypertension Father    Diabetes Brother    Hypertension Brother    Hyperlipidemia Brother    Colon polyps Brother    Thyroid disease Daughter    Obesity Daughter    Mental illness Daughter    Colon cancer Maternal Uncle    Stomach cancer Neg Hx    Pancreatic cancer Neg Hx     Social History   Tobacco Use   Smoking status: Light Smoker    Packs/day: 0.50    Years: 43.00    Pack years: 21.50    Types: Cigarettes   Smokeless tobacco: Never   Tobacco comments:    patches have helped in the past  Vaping Use   Vaping Use: Never used  Substance Use Topics   Alcohol use: No   Drug use: No    Home Medications Prior to Admission medications   Medication Sig Start Date End Date Taking? Authorizing Provider  acetaminophen (TYLENOL) 500 MG tablet Take 500-1,000 mg by mouth every 8 (eight) hours as needed (for headaches).   Yes [provider]  albuterol (VENTOLIN HFA) 108 (90 Base) MCG/ACT inhaler Inhale 1-2 puffs into the lungs every 6 (six) hours as needed for wheezing or shortness of breath. 11/03/19  Yes Darr, Edison Nasuti, PA-C  ALPRAZolam Duanne Moron) 0.5 MG tablet Take 0.5-1 mg by mouth See admin instructions. Take 0.5-1 mg by mouth at bedtime and an additional 0.5 mg up to two times a day as needed  for anxiety   Yes [provider]  atorvastatin (LIPITOR) 20 MG tablet Take 20 mg by mouth at bedtime. 01/17/21  Yes [provider]  cyclobenzaprine (FLEXERIL) 10 MG tablet Take 10 mg by mouth 3 (three) times daily.   Yes [provider]  gabapentin (NEURONTIN) 400 MG capsule Take 400 mg by mouth 4 (four) times daily. 01/17/21  Yes [provider]  lisinopril-hydrochlorothiazide (ZESTORETIC) 20-12.5 MG tablet Take 1 tablet by mouth at bedtime. 12/27/20  Yes [provider]  pantoprazole (PROTONIX) 40 MG  tablet Take 1 tablet (40 mg total) by mouth 2 (two) times daily. Patient taking differently: Take 40 mg by mouth at bedtime. 03/05/17 04/05/21 Yes Milus Banister, MD  QUEtiapine (SEROQUEL) 100 MG tablet Take 100 mg by mouth in the morning and at bedtime.   Yes [provider]  zolpidem (AMBIEN) 5 MG tablet Take 5 mg by mouth at bedtime. 01/25/21  Yes [provider]  acetaminophen (TYLENOL) 325 MG tablet Take 2 tablets (650 mg total) by mouth every 6 (six) hours as needed. Patient not taking: Reported on 02/03/2021 11/03/19   Darr, Edison Nasuti, PA-C  gabapentin (NEURONTIN) 300 MG capsule TAKE 1 CAPSULE BY MOUTH EVERYDAY AT BEDTIME Patient not taking: Reported on 02/03/2021 11/09/17   Newt Minion, MD    Allergies    Patient has no known allergies.  Review of Systems   Review of Systems  Constitutional:  Positive for activity change and fatigue.  Eyes:  Negative for visual disturbance.  Respiratory:  Negative for shortness of breath.   Cardiovascular:  Negative for chest pain.  Neurological:  Positive for weakness and headaches.  Hematological:  Does not bruise/bleed easily.  All other systems reviewed and are negative.  Physical Exam Updated Vital Signs BP 112/74 (BP Location: Right Arm)   Pulse 100   Temp 98.4 F (36.9 C) (Oral)   Resp 16   Ht 5\' 2"  (1.575 m)   Wt 77.1 kg   SpO2 98%   BMI 31.09 kg/m   Physical Exam Vitals  and nursing note reviewed.  Constitutional:      Appearance: She is well-developed.  HENT:     Head: Atraumatic.  Eyes:     General: No visual field deficit. Neck:     Comments: No midline C-spine tenderness Cardiovascular:     Rate and Rhythm: Tachycardia present.     Comments: 2+ and equal radial pulse, dorsalis pedis pulse Pulmonary:     Effort: Pulmonary effort is normal.  Musculoskeletal:     Cervical back: Normal range of motion and neck supple. No rigidity.  Skin:    General: Skin is warm and dry.  Neurological:     Mental Status: She is alert and oriented to person, place, and time.     Cranial Nerves: No cranial nerve deficit, dysarthria or facial asymmetry.     Sensory: No sensory deficit.     Motor: No weakness.     Coordination: Coordination normal.     Gait: Gait abnormal.     Comments: Diminished bilateral patellar reflexes.  Patient was able to ambulate on her own, not ataxic but not steady with her gait    ED Results / Procedures / Treatments   Labs (all labs ordered are listed, but only abnormal results are displayed) Labs Reviewed  COMPREHENSIVE METABOLIC PANEL - Abnormal; Notable for the following components:      Result Value   CO2 21 (*)    Glucose, Bld 127 (*)    Creatinine, Ser 1.06 (*)    Albumin 3.4 (*)    GFR, Estimated 59 (*)    All other components within normal limits  CBC WITH DIFFERENTIAL/PLATELET - Abnormal; Notable for the following components:   WBC 11.4 (*)    RBC 3.18 (*)    Hemoglobin 10.4 (*)    HCT 32.5 (*)    MCV 102.2 (*)    Neutro Abs 7.8 (*)    All other components within normal limits  LACTIC ACID, PLASMA  EKG EKG Interpretation  Date/Time:  Thursday February 03 2021 17:26:50 EDT Ventricular Rate:  89 PR Interval:  134 QRS Duration: 78 QT Interval:  380 QTC Calculation: 462 R Axis:   63 Text Interpretation: Normal sinus rhythm Nonspecific T wave abnormality Abnormal ECG No acute changes No significant change  since last tracing Confirmed by Varney Biles ([id number]) on 02/03/2021 6:45:29 PM  Radiology CT Angio Head W or Wo Contrast  Result Date: 02/03/2021 CLINICAL DATA:  Carotid artery aneurysm suspected; Neuro deficit, acute, stroke suspected EXAM: CT ANGIOGRAPHY HEAD AND NECK TECHNIQUE: Multidetector CT imaging of the head and neck was performed using the standard protocol during bolus administration of intravenous contrast. Multiplanar CT image reconstructions and MIPs were obtained to evaluate the vascular anatomy. Carotid stenosis measurements (when applicable) are obtained utilizing NASCET criteria, using the distal internal carotid diameter as the denominator. CONTRAST:  64mL OMNIPAQUE IOHEXOL 350 MG/ML SOLN COMPARISON:  None. FINDINGS: CTA NECK FINDINGS Aortic arch: Great vessel origins are patent. Approximately 40-50% stenosis of the left subclavian artery origin. Right carotid system: Mild to moderate multifocal stenosis of the common carotid artery which does not exceed 50%. Carotid bifurcation atherosclerosis without greater than 50% stenosis. No aneurysm identified. Left carotid system: Mixed calcific and noncalcific atherosclerosis at the carotid bifurcation and involving the proximal ICA with approximally 30-40% stenosis relative to the distal vessel. Vertebral arteries: Left dominant. Mild multifocal narrowing of the right vertebral artery. Left vertebral artery is patent without significant stenosis. Skeleton: Mild-to-moderate lower cervical degenerative disc disease. Other neck: No acute abnormality. Approximally 8 mm right thyroid nodule. Not clinically significant; no follow-up imaging recommended (ref: J Am Coll Radiol. 2015 Feb;12(2): 143-50). Upper chest: Evaluated on concurrent CTA chest. Review of the MIP images confirms the above findings CTA HEAD FINDINGS Anterior circulation: Bilateral intracranial ICAs are patent with mild bilateral paraclinoid ICA narrowing. Bilateral M1 MCAs are patent  without hemodynamically significant stenosis. Bilateral proximal M2 MCAs are patent. Bilateral ACAs are patent without proximal hemodynamically significant stenosis. Mild narrowing of the distal ACAs. Posterior circulation: Left dominant. Bilateral intradural vertebral arteries basilar artery, and posterior cerebral arteries are patent without proximal hemodynamically significant stenosis. Bilateral fetal type PCAs, anatomic variant. No aneurysm identified. Venous sinuses: As permitted by contrast timing, patent. Anatomic variants: As detailed above. Review of the MIP images confirms the above findings IMPRESSION: CTA head: 1. No large vessel occlusion or proximal hemodynamically significant stenosis. 2. Mild bilateral paraclinoid ICA stenosis. CTA neck: 1. Left carotid bifurcation/proximal ICA atherosclerosis with approximately 30-40% stenosis relative to the distal vessel. Mild-to-moderate stenosis of the right common carotid artery which does not exceed 50%. 2. Approximately 40-50% stenosis of the left subclavian artery origin. Electronically Signed   By: Margaretha Sheffield M.D.   On: 02/03/2021 18:35   CT Head Wo Contrast  Result Date: 02/03/2021 CLINICAL DATA:  65 year old female with new onset of headache. EXAM: CT HEAD WITHOUT CONTRAST TECHNIQUE: Contiguous axial images were obtained from the base of the skull through the vertex without intravenous contrast. COMPARISON:  Head CT 06/30/2012. FINDINGS: Brain: Mild cerebral atrophy. Focal area of low attenuation in the subcortical gray matter of the left posterior frontal region, similar to prior study from 2014, compatible with a small amount of encephalomalacia from remote left frontal infarct. No evidence of acute infarction, hemorrhage, hydrocephalus, extra-axial collection or mass lesion/mass effect. Vascular: No hyperdense vessel or unexpected calcification. Skull: Normal. Negative for fracture or focal lesion. Sinuses/Orbits: No acute finding. Other:  None. IMPRESSION: 1.  No acute intracranial abnormalities. 2. 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 Electronically Signed   By: Vinnie Langton M.D.   On: 02/03/2021 12:03   CT Angio Neck W and/or Wo Contrast  Result Date: 02/03/2021 CLINICAL DATA:  Carotid artery aneurysm suspected; Neuro deficit, acute, stroke suspected EXAM: CT ANGIOGRAPHY HEAD AND NECK TECHNIQUE: Multidetector CT imaging of the head and neck was performed using the standard protocol during bolus administration of intravenous contrast. Multiplanar CT image reconstructions and MIPs were obtained to evaluate the vascular anatomy. Carotid stenosis measurements (when applicable) are obtained utilizing NASCET criteria, using the distal internal carotid diameter as the denominator. CONTRAST:  23mL OMNIPAQUE IOHEXOL 350 MG/ML SOLN COMPARISON:  None. FINDINGS: CTA NECK FINDINGS Aortic arch: Great vessel origins are patent. Approximately 40-50% stenosis of the left subclavian artery origin. Right carotid system: Mild to moderate multifocal stenosis of the common carotid artery which does not exceed 50%. Carotid bifurcation atherosclerosis without greater than 50% stenosis. No aneurysm identified. Left carotid system: Mixed calcific and noncalcific atherosclerosis at the carotid bifurcation and involving the proximal ICA with approximally 30-40% stenosis relative to the distal vessel. Vertebral arteries: Left dominant. Mild multifocal narrowing of the right vertebral artery. Left vertebral artery is patent without significant stenosis. Skeleton: Mild-to-moderate lower cervical degenerative disc disease. Other neck: No acute abnormality. Approximally 8 mm right thyroid nodule. Not clinically significant; no follow-up imaging recommended (ref: J Am Coll Radiol. 2015 Feb;12(2): 143-50). Upper chest: Evaluated on concurrent CTA chest. Review of the MIP images confirms the above findings CTA HEAD FINDINGS Anterior circulation:  Bilateral intracranial ICAs are patent with mild bilateral paraclinoid ICA narrowing. Bilateral M1 MCAs are patent without hemodynamically significant stenosis. Bilateral proximal M2 MCAs are patent. Bilateral ACAs are patent without proximal hemodynamically significant stenosis. Mild narrowing of the distal ACAs. Posterior circulation: Left dominant. Bilateral intradural vertebral arteries basilar artery, and posterior cerebral arteries are patent without proximal hemodynamically significant stenosis. Bilateral fetal type PCAs, anatomic variant. No aneurysm identified. Venous sinuses: As permitted by contrast timing, patent. Anatomic variants: As detailed above. Review of the MIP images confirms the above findings IMPRESSION: CTA head: 1. No large vessel occlusion or proximal hemodynamically significant stenosis. 2. Mild bilateral paraclinoid ICA stenosis. CTA neck: 1. Left carotid bifurcation/proximal ICA atherosclerosis with approximately 30-40% stenosis relative to the distal vessel. Mild-to-moderate stenosis of the right common carotid artery which does not exceed 50%. 2. Approximately 40-50% stenosis of the left subclavian artery origin. Electronically Signed   By: Margaretha Sheffield M.D.   On: 02/03/2021 18:35   CT Angio Chest PE W and/or Wo Contrast  Result Date: 02/03/2021 CLINICAL DATA:  PE suspected, high prob EXAM: CT ANGIOGRAPHY CHEST WITH CONTRAST TECHNIQUE: Multidetector CT imaging of the chest was performed using the standard protocol during bolus administration of intravenous contrast. Multiplanar CT image reconstructions and MIPs were obtained to evaluate the vascular anatomy. CONTRAST:  62mL OMNIPAQUE IOHEXOL 350 MG/ML SOLN COMPARISON:  Chest x-ray 10/29/2020 FINDINGS: Cardiovascular: Normal heart size. No pericardial effusion. Satisfactory opacification of the pulmonary arteries to the segmental branch level. No filling defect to suggest pulmonary embolism. Thoracic aorta is nonaneurysmal.  Mild atherosclerotic calcification of the aorta and coronary arteries. Mediastinum/Nodes: No enlarged mediastinal, hilar, or axillary lymph nodes. Thyroid gland, trachea, and esophagus demonstrate no significant findings. Lungs/Pleura: Small focal reticulonodular ground-glass opacity in the periphery of the left upper lobe (series 7, image 51). No pleural effusion. No pneumothorax. Upper Abdomen: No acute abnormality. Musculoskeletal: Mild  degenerative changes within the thoracic spine. No acute osseous findings. No chest wall abnormality. Review of the MIP images confirms the above findings. IMPRESSION: 1. No evidence of pulmonary embolism. 2. Small focal reticulonodular ground-glass opacity in the periphery of the left upper lobe, which may represent an infectious or inflammatory process. 3. Aortic and coronary artery atherosclerosis (ICD10-I70.0). Electronically Signed   By: Davina Poke D.O.   On: 02/03/2021 18:51    Procedures Procedures   Smoking cessation instruction/counseling given:  counseled patient on the dangers of tobacco use, advised patient to stop smoking, and reviewed strategies to maximize success   Medications Ordered in ED Medications  sodium chloride 0.9 % bolus 1,000 mL (0 mLs Intravenous Stopped 02/03/21 1852)  prochlorperazine (COMPAZINE) injection 10 mg (10 mg Intravenous Given 02/03/21 1714)  diphenhydrAMINE (BENADRYL) injection 25 mg (25 mg Intravenous Given 02/03/21 1714)  iohexol (OMNIPAQUE) 350 MG/ML injection 80 mL (80 mLs Intravenous Contrast Given 02/03/21 1807)  ketorolac (TORADOL) 15 MG/ML injection 15 mg (15 mg Intravenous Given 02/03/21 1851)  acetaminophen (TYLENOL) tablet 1,000 mg (1,000 mg Oral Given 02/03/21 1851)    ED Course  I have reviewed the triage vital signs and the nursing notes.  Pertinent labs & imaging results that were available during my care of the patient were reviewed by me and considered in my medical decision making (see chart for  details).  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 Results discussed and pt asked to f/u w/ cards. Advised starting aspirin and stopping her smoking. [AN]    Clinical Course User Index [AN] Varney Biles, MD   MDM Rules/Calculators/A&P                           65 year old female comes in with chief complaint of neck pain and headaches.  This is her primary complaint, she has history of hypertension, extensive tobacco use and remote cocaine use history.  No history of strokes, heart attack.  It does not appear that there is any associated focal neurodeficit.  However, headache at this age which is new, with concerning features of radiation to the neck and worse with movement does raise suspicion for vertebral dissection, vascular stenosis/thrombosis.  She is also tachycardic, but her pulse and vascular exam is reassuring.  With her weakness and shortness of breath, PE also in the differential diagnosis along with thrombosis of the aorta, dissection.  I did not appreciate any pulsatile mass over her abdomen and she is denying any abdominal pain.  AAA possible as well, but would be unlikely to cause headaches.  Plan is to get basic labs, CT angio head and neck along with CT PE. [place] AAA can be ordered as an outpatient.    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

## 2021-02-03 NOTE — ED Provider Notes (Signed)
Emergency Medicine Provider Triage Evaluation Note  Lori [name] , a 65 y.o. female  was evaluated in triage.  Pt complains of swollen tongue 4 days ago.  When she went to her doctor for that it resolved, then developed a generalized head soreness with pain in both of her legs which is making it difficult to walk.  She denies unilateral weakness or numbness..  Review of Systems  Positive: Headache, body aches Negative: Fever  Physical Exam  BP (!) 127/97 (BP Location: Right Arm)   Pulse (!) 108   Temp 99 F (37.2 C) (Oral)   Resp 16   Ht 5\' 2"  (1.575 m)   Wt 77.1 kg   SpO2 100%   BMI 31.09 kg/m  Gen:   Awake, no distress   Resp:  Normal effort  MSK:   Moves extremities without difficulty  Other:  Able to straight leg raise both legs, no weakness.  Normal upper extremity weakness.  Normal range of motion neck.  Medical Decision Making  Medically screening exam initiated at 10:03 AM.  Appropriate orders placed.  Lori [name] was informed that the remainder of the evaluation will be completed by another provider, this initial triage assessment does not replace that evaluation, and the importance of remaining in the ED until their evaluation is complete.     Lori Learn, PA-C 02/03/21 1004    Lucrezia Starch, MD 02/03/21 1315

## 2021-02-08 ENCOUNTER — Ambulatory Visit: Payer: Medicaid Other | Admitting: Cardiology

## 2021-02-10 ENCOUNTER — Ambulatory Visit (INDEPENDENT_AMBULATORY_CARE_PROVIDER_SITE_OTHER): Payer: Medicaid Other | Admitting: Cardiology

## 2021-02-10 ENCOUNTER — Other Ambulatory Visit: Payer: Self-pay

## 2021-02-10 ENCOUNTER — Encounter (HOSPITAL_BASED_OUTPATIENT_CLINIC_OR_DEPARTMENT_OTHER): Payer: Self-pay | Admitting: Cardiology

## 2021-02-10 ENCOUNTER — Other Ambulatory Visit (HOSPITAL_BASED_OUTPATIENT_CLINIC_OR_DEPARTMENT_OTHER): Payer: Self-pay | Admitting: *Deleted

## 2021-02-10 VITALS — BP 120/60 | HR 88 | Ht 62.0 in | Wt 167.0 lb

## 2021-02-10 DIAGNOSIS — I771 Stricture of artery: Secondary | ICD-10-CM | POA: Insufficient documentation

## 2021-02-10 DIAGNOSIS — I10 Essential (primary) hypertension: Secondary | ICD-10-CM

## 2021-02-10 DIAGNOSIS — Z79899 Other long term (current) drug therapy: Secondary | ICD-10-CM | POA: Diagnosis not present

## 2021-02-10 DIAGNOSIS — I6523 Occlusion and stenosis of bilateral carotid arteries: Secondary | ICD-10-CM | POA: Insufficient documentation

## 2021-02-10 DIAGNOSIS — I2510 Atherosclerotic heart disease of native coronary artery without angina pectoris: Secondary | ICD-10-CM | POA: Insufficient documentation

## 2021-02-10 DIAGNOSIS — F17200 Nicotine dependence, unspecified, uncomplicated: Secondary | ICD-10-CM | POA: Diagnosis not present

## 2021-02-10 DIAGNOSIS — I2584 Coronary atherosclerosis due to calcified coronary lesion: Secondary | ICD-10-CM

## 2021-02-10 DIAGNOSIS — I700 Atherosclerosis of aorta: Secondary | ICD-10-CM | POA: Insufficient documentation

## 2021-02-10 MED ORDER — ASPIRIN EC 81 MG PO TBEC: 81.0000 mg | DELAYED_RELEASE_TABLET | Freq: Every day | ORAL | 3 refills | Status: AC

## 2021-02-10 MED ORDER — ATORVASTATIN CALCIUM 40 MG PO TABS: 40.0000 mg | ORAL_TABLET | Freq: Every day | ORAL | 3 refills | Status: AC

## 2021-02-10 NOTE — Assessment & Plan Note (Signed)
Mild 40 to 50%.  No left arm claudication.  Increase statin, aspirin 81.

## 2021-02-10 NOTE — Patient Instructions (Signed)
Medication Instructions:  Please start Asprin 81 mg a day. Increase Atorvastatin to 40 mg a day. Continue all other medications as listed.  *If you need a refill on your cardiac medications before your next appointment, please call your pharmacy*  Lab Work: Please have blood work in 3 months. (Fasting Lipid/ALT)  If you have labs (blood work) drawn today and your tests are completely normal, you will receive your results only by: [place] (if you have MyChart) OR A paper copy in the mail If you have any lab test that is abnormal or we need to change your treatment, we will call you to review the results.   Testing/Procedures: Your physician has requested that you have a carotid duplex in 1 year. This test is an ultrasound of the carotid arteries in your neck. It looks at blood flow through these arteries that supply the brain with blood. Allow one hour for this exam. There are no restrictions or special instructions.  You will be contacted at a later date to be scheduled for this exam.  It will be completed at our [facility] office.  Follow-Up: At [facility], you and your health needs are our priority.  As part of our continuing mission to provide you with exceptional heart care, we have created designated Provider Care Teams.  These Care Teams include your primary Cardiologist (physician) and Advanced Practice Providers (APPs -  Physician Assistants and Nurse Practitioners) who all work together to provide you with the care you need, when you need it.  We recommend signing up for the patient portal called "MyChart".  Sign up information is provided on this After Visit Summary.  MyChart is used to connect with patients for Virtual Visits (Telemedicine).  Patients are able to view lab/test results, encounter notes, upcoming appointments, etc.  Non-urgent messages can be sent to your provider as well.   To learn more about what you can do with MyChart, go to  [email or web address].    Your next appointment:   1 year(s)  The format for your next appointment:   In Person  Provider:   Candee Furbish, MD   Thank you for choosing [facility]!!

## 2021-02-10 NOTE — Assessment & Plan Note (Signed)
Mild noted on CT personally reviewed.  Starting aspirin 81 mg.  Increasing atorvastatin to 40 for protection.  Continue with good blood pressure control.

## 2021-02-10 NOTE — Assessment & Plan Note (Signed)
Currently well controlled on lisinopril hydrochlorothiazide 20/12.5 mg once a day.  Continue with current medical management.  Excellent.

## 2021-02-10 NOTE — Assessment & Plan Note (Signed)
Continue to encourage tobacco cessation. 

## 2021-02-10 NOTE — Progress Notes (Signed)
Cardiology Office Note:    Date:  02/10/2021   ID:  Lori [name], DOB [birth year], MRN [id number]  PCP:  Alvester Chou, NP   [facility] Providers Cardiologist:  None     Referring MD: Alvester Chou, NP   History of Present Illness:    Lori [name] is a 65 y.o. female here for the evaluation of tachycardia and carotid artery disorder by ER referral.  She presented to the ED 02/03/2021 complaining of a moderate to severe frontal headache lasting for 1.5 days, associated with radiating pain to the neck. Her pain was constant and worse with neck movement and lights. She was also suffering from difficulty in remembering things and bilateral LE pain radiating from her back.  Today: She is accompanied by her sister. Overall she is not feeling well. She continues to suffer from severe frontal headaches and radiating neck pain, which is present at this time. She states it "is so bad she can barely stand it." It is very painful for her to turn her head to either side.  Currently she does smoke (since she was 65 yo). She is working on quitting.  Of note, she has had previous medication intolerance due to pancreatitis. She is unable to take ibuprofen or aleve.  She denies any palpitations, chest pain, or shortness of breath. No lightheadedness, syncope, orthopnea, or PND. Also has no lower extremity edema or exertional symptoms.  Past Medical History:  Diagnosis Date   Arthritis    Depression    Depression with anxiety Aug 08, 2008   Patient had multiple death in her family over a short period of time. father, brother, uncle and niece who was stabbed 3 years ago. Patient's husband had a car accident last year and  need currently a feeding tube.    GERD (gastroesophageal reflux disease)    Gout    Headache(784.0)    migraines   History of cervical cancer  1985    status post partial hysterectomy , last Pap smear 10 years ago , no further followup   History of cocaine abuse ([place])  10/23/2008    last documented in 2005-08-08 when admitted for hypertensive urgency   Hypertension 2005-08-08   Admitted for HTN crisis in 10/2008. Was given in 2008-08-08 a wrong prescription from the pharmacy and per ED note it was Lisinopril-Hydrocholorthizide which gave her a  hives and feeling of sickness. Patient was started  on this meds on 05/31/2010 by Dr  Ihor Gully without any problem.    Insomnia    Tobacco abuse     35 years   Uterine cancer [facility])     Past Surgical History:  Procedure Laterality Date   BIOPSY  02/28/2020   Procedure: BIOPSY;  Surgeon: Doran Stabler, MD;  Location: [facility] ENDOSCOPY;  Service: Gastroenterology;;   [place]   COLONOSCOPY WITH PROPOFOL N/A 03/01/2017   Procedure: COLONOSCOPY WITH PROPOFOL;  Surgeon: Milus Banister, MD;  Location: [facility] ENDOSCOPY;  Service: Endoscopy;  Laterality: N/A;   ESOPHAGOGASTRODUODENOSCOPY (EGD) WITH PROPOFOL N/A 03/01/2017   Procedure: ESOPHAGOGASTRODUODENOSCOPY (EGD) WITH PROPOFOL;  Surgeon: Milus Banister, MD;  Location: [facility] ENDOSCOPY;  Service: Endoscopy;  Laterality: N/A;   ESOPHAGOGASTRODUODENOSCOPY (EGD) WITH PROPOFOL N/A 02/28/2020   Procedure: ESOPHAGOGASTRODUODENOSCOPY (EGD) WITH PROPOFOL;  Surgeon: Doran Stabler, MD;  Location: [place];  Service: Gastroenterology;  Laterality: N/A;   PARTIAL HYSTERECTOMY     SKIN GRAFT      Current Medications: Current Meds  Medication Sig   acetaminophen (TYLENOL) 325 MG tablet Take 2 tablets (650 mg total) by mouth every 6 (six) hours as needed.   acetaminophen (TYLENOL) 500 MG tablet Take 500-1,000 mg by mouth every 8 (eight) hours as needed (for headaches).   albuterol (VENTOLIN HFA) 108 (90 Base) MCG/ACT inhaler Inhale 1-2 puffs into the lungs every 6 (six) hours as needed for wheezing or shortness of breath.   ALPRAZolam (XANAX) 0.5 MG tablet Take 0.5-1 mg by mouth See admin instructions. Take 0.5-1 mg by mouth at bedtime and an additional 0.5 mg up to two times a  day as needed for anxiety   aspirin EC 81 MG tablet Take 1 tablet (81 mg total) by mouth daily. Swallow whole.   atorvastatin (LIPITOR) 40 MG tablet Take 1 tablet (40 mg total) by mouth daily.   cyclobenzaprine (FLEXERIL) 10 MG tablet Take 10 mg by mouth 3 (three) times daily.   gabapentin (NEURONTIN) 300 MG capsule TAKE 1 CAPSULE BY MOUTH EVERYDAY AT BEDTIME   gabapentin (NEURONTIN) 400 MG capsule Take 400 mg by mouth 4 (four) times daily.   lisinopril-hydrochlorothiazide (ZESTORETIC) 20-12.5 MG tablet Take 1 tablet by mouth at bedtime.   pantoprazole (PROTONIX) 40 MG tablet Take 1 tablet (40 mg total) by mouth 2 (two) times daily.   QUEtiapine (SEROQUEL) 100 MG tablet Take 100 mg by mouth in the morning and at bedtime.   zolpidem (AMBIEN) 5 MG tablet Take 5 mg by mouth at bedtime.   [DISCONTINUED] atorvastatin (LIPITOR) 20 MG tablet Take 20 mg by mouth at bedtime.     Allergies:   Patient has no known allergies.   Social History   Socioeconomic History   Marital status: Single    Spouse name: Not on file   Number of children: 2   Years of education: Not on file   Highest education level: Not on file  Occupational History   Not on file  Tobacco Use   Smoking status: Light Smoker    Packs/day: 0.50    Years: 43.00    Pack years: 21.50    Types: Cigarettes   Smokeless tobacco: Never   Tobacco comments:    patches have helped in the past  Vaping Use   Vaping Use: Never used  Substance and Sexual Activity   Alcohol use: No   Drug use: No   Sexual activity: Not Currently    Partners: Male  Other Topics Concern   Not on file  Social History Narrative   Patient had multiple death in her family over a short period of time. father, brother, uncle and niece who was stabbed 3 years ago. Patient's husband had a car accident last year and  need currently a feeding tube.          Social Determinants of Health   Financial Resource Strain: Not on file  Food Insecurity: Food  Insecurity Present   Worried About [place] in the Last Year: Sometimes true   Ran Out of Food in the Last Year: Sometimes true  Transportation Needs: No Transportation Needs   Lack of Transportation (Medical): No   Lack of Transportation (Non-Medical): No  Physical Activity: Not on file  Stress: Not on file  Social Connections: Not on file     Family History: The patient's family history includes Colon cancer in her maternal uncle; Colon polyps in her brother; Diabetes in her brother and father; Heart disease in her father; Hyperlipidemia in her brother and father;  Hypertension in her brother and father; Mental illness in her daughter; Obesity in her daughter; Thyroid disease in her daughter and mother. There is no history of Stomach cancer or Pancreatic cancer.  ROS:   Please see the history of present illness.    (+) Headaches (+) Neck pain All other systems reviewed and are negative.  EKGs/Labs/Other Studies Reviewed:    The following studies were reviewed today:  CTA Chest 02/03/2021: COMPARISON:  Chest x-ray 10/29/2020   FINDINGS: Cardiovascular: Normal heart size. No pericardial effusion. Satisfactory opacification of the pulmonary arteries to the segmental branch level. No filling defect to suggest pulmonary embolism. Thoracic aorta is nonaneurysmal. Mild atherosclerotic calcification of the aorta and coronary arteries.   Mediastinum/Nodes: No enlarged mediastinal, hilar, or axillary lymph nodes. Thyroid gland, trachea, and esophagus demonstrate no significant findings.   Lungs/Pleura: Small focal reticulonodular ground-glass opacity in the periphery of the left upper lobe (series 7, image 51). No pleural effusion. No pneumothorax.   Upper Abdomen: No acute abnormality.   Musculoskeletal: Mild degenerative changes within the thoracic spine. No acute osseous findings. No chest wall abnormality.   Review of the MIP images confirms the above findings.    IMPRESSION: 1. No evidence of pulmonary embolism. 2. Small focal reticulonodular ground-glass opacity in the periphery of the left upper lobe, which may represent an infectious or inflammatory process. 3. Aortic and coronary artery atherosclerosis (ICD10-I70.0).  CTA Head and Neck 02/03/2021: FINDINGS: CTA NECK FINDINGS   Aortic arch: Great vessel origins are patent. Approximately 40-50% stenosis of the left subclavian artery origin.   Right carotid system: Mild to moderate multifocal stenosis of the common carotid artery which does not exceed 50%. Carotid bifurcation atherosclerosis without greater than 50% stenosis. No aneurysm identified.   Left carotid system: Mixed calcific and noncalcific atherosclerosis at the carotid bifurcation and involving the proximal ICA with approximally 30-40% stenosis relative to the distal vessel.   Vertebral arteries: Left dominant. Mild multifocal narrowing of the right vertebral artery. Left vertebral artery is patent without significant stenosis.   Skeleton: Mild-to-moderate lower cervical degenerative disc disease.   Other neck: No acute abnormality. Approximally 8 mm right thyroid nodule. Not clinically significant; no follow-up imaging recommended (ref: J Am Coll Radiol. 2015 Feb;12(2): 143-50).   Upper chest: Evaluated on concurrent CTA chest.   Review of the MIP images confirms the above findings   CTA HEAD FINDINGS   Anterior circulation: Bilateral intracranial ICAs are patent with mild bilateral paraclinoid ICA narrowing. Bilateral M1 MCAs are patent without hemodynamically significant stenosis. Bilateral proximal M2 MCAs are patent. Bilateral ACAs are patent without proximal hemodynamically significant stenosis. Mild narrowing of the distal ACAs.   Posterior circulation: Left dominant. Bilateral intradural vertebral arteries basilar artery, and posterior cerebral arteries are patent without proximal hemodynamically  significant stenosis. Bilateral fetal type PCAs, anatomic variant. No aneurysm identified.   Venous sinuses: As permitted by contrast timing, patent.   Anatomic variants: As detailed above.   Review of the MIP images confirms the above findings   IMPRESSION: CTA head: 1. No large vessel occlusion or proximal hemodynamically significant stenosis. 2. Mild bilateral paraclinoid ICA stenosis.   CTA neck: 1. Left carotid bifurcation/proximal ICA atherosclerosis with approximately 30-40% stenosis relative to the distal vessel. Mild-to-moderate stenosis of the right common carotid artery which does not exceed 50%. 2. Approximately 40-50% stenosis of the left subclavian artery origin.  EKG:  EKG is personally reviewed and interpreted. 02/10/2021: EKG was not ordered today. 02/03/2021 (ED): Sinus rhythm.  Rate 89 bpm. Nonspecific T wave abnormality.  Recent Labs: 02/03/2021: ALT 13; BUN 12; Creatinine, Ser 1.06; Hemoglobin 10.4; Platelets 399; Potassium 4.2; Sodium 135   Recent Lipid Panel    Component Value Date/Time   TRIG 106 09/11/2016 0403     Risk Assessment/Calculations:          Physical Exam:    VS:  BP 120/60 (BP Location: Left Arm, Patient Position: Sitting, Cuff Size: Normal)   Pulse 88   Ht 5\' 2"  (1.575 m)   Wt 167 lb (75.8 kg)   SpO2 96%   BMI 30.54 kg/m     Wt Readings from Last 3 Encounters:  02/10/21 167 lb (75.8 kg)  02/03/21 170 lb (77.1 kg)  03/01/20 185 lb 4.8 oz (84.1 kg)     GEN: Well nourished, well developed in no acute distress HEENT: Normal NECK: No JVD; No carotid bruits LYMPHATICS: No lymphadenopathy CARDIAC: RRR, no murmurs, rubs, gallops RESPIRATORY:  Clear to auscultation without rales, wheezing or rhonchi  ABDOMEN: Soft, non-tender, non-distended MUSCULOSKELETAL:  No edema; No deformity  SKIN: Warm and dry NEUROLOGIC:  Alert and oriented x 3 PSYCHIATRIC:  Normal affect   ASSESSMENT:    1. Essential hypertension   2. TOBACCO  ABUSE   3. Carotid artery stenosis, asymptomatic, bilateral   4. Medication management   5. Coronary artery calcification   6. Aortic atherosclerosis (HCC)   7. Subclavian artery stenosis, left (HCC)    PLAN:    In order of problems listed above: TOBACCO ABUSE Continue to encourage tobacco cessation.   Carotid artery stenosis, asymptomatic, bilateral CT scan personally reviewed as above.  We will go ahead and increase her atorvastatin from 20 up to 40 mg, high intensity dose to help with plaque stabilization.  We will also start her on aspirin 81 mg once a day.  Obviously be careful with prior episode of pancreatitis.  She avoids NSAIDs.  In 1 year we will check a vascular carotid ultrasound to ensure stability.  She is not in surgical range as of now.  Continue to encourage tobacco cessation.  Fruits vegetables daily exercise.  I do not think that her headaches are associated with her carotid artery disease.  When she turns her head it hurts, could be more myofascial discomfort.  Essential hypertension Currently well controlled on lisinopril hydrochlorothiazide 20/12.5 mg once a day.  Continue with current medical management.  Excellent.  Coronary artery calcification Mild noted on CT personally reviewed.  Starting aspirin 81 mg.  Increasing atorvastatin to 40 for protection.  Continue with good blood pressure control.  Aortic atherosclerosis (HCC) Starting aspirin 81 mg.  Increasing atorvastatin.  Subclavian artery stenosis, left (HCC) Mild 40 to 50%.  No left arm claudication.  Increase statin, aspirin 81.     Follow-up: 1 year.  Medication Adjustments/Labs and Tests Ordered: Current medicines are reviewed at length with the patient today.  Concerns regarding medicines are outlined above.  Orders Placed This Encounter  Procedures   Lipid panel   ALT   VAS US CAROTID     Meds ordered this encounter  Medications   atorvastatin (LIPITOR) 40 MG tablet    Sig: Take 1  tablet (40 mg total) by mouth daily.    Dispense:  90 tablet    Refill:  3   aspirin EC 81 MG tablet    Sig: Take 1 tablet (81 mg total) by mouth daily. Swallow whole.    Dispense:  90 tablet  Refill:  3     Patient Instructions  Medication Instructions:  Please start Asprin 81 mg a day. Increase Atorvastatin to 40 mg a day. Continue all other medications as listed.  *If you need a refill on your cardiac medications before your next appointment, please call your pharmacy*  Lab Work: Please have blood work in 3 months. (Fasting Lipid/ALT)  If you have labs (blood work) drawn today and your tests are completely normal, you will receive your results only by: [place] (if you have MyChart) OR A paper copy in the mail If you have any lab test that is abnormal or we need to change your treatment, we will call you to review the results.   Testing/Procedures: Your physician has requested that you have a carotid duplex in 1 year. This test is an ultrasound of the carotid arteries in your neck. It looks at blood flow through these arteries that supply the brain with blood. Allow one hour for this exam. There are no restrictions or special instructions.  You will be contacted at a later date to be scheduled for this exam.  It will be completed at our [facility] office.  Follow-Up: At [facility], you and your health needs are our priority.  As part of our continuing mission to provide you with exceptional heart care, we have created designated Provider Care Teams.  These Care Teams include your primary Cardiologist (physician) and Advanced Practice Providers (APPs -  Physician Assistants and Nurse Practitioners) who all work together to provide you with the care you need, when you need it.  We recommend signing up for the patient portal called "MyChart".  Sign up information is provided on this After Visit Summary.  MyChart is used to connect with patients for Virtual Visits  (Telemedicine).  Patients are able to view lab/test results, encounter notes, upcoming appointments, etc.  Non-urgent messages can be sent to your provider as well.   To learn more about what you can do with MyChart, go to [email or web address].    Your next appointment:   1 year(s)  The format for your next appointment:   In Person  Provider:   Candee Furbish, MD   Thank you for choosing [facility]!!     I,Mathew Stumpf,acting as a scribe for Candee Furbish, MD.,have documented all relevant documentation on the behalf of Candee Furbish, MD,as directed by  Candee Furbish, MD while in the presence of Candee Furbish, MD.  I, Candee Furbish, MD, have reviewed all documentation for this visit. The documentation on 02/10/21 for the exam, diagnosis, procedures, and orders are all accurate and complete.   Signed, Candee Furbish, MD  02/10/2021 10:43 AM    [facility]

## 2021-02-10 NOTE — Assessment & Plan Note (Signed)
Starting aspirin 81 mg.  Increasing atorvastatin.

## 2021-02-10 NOTE — Assessment & Plan Note (Addendum)
CT scan personally reviewed as above.  We will go ahead and increase her atorvastatin from 20 up to 40 mg, high intensity dose to help with plaque stabilization.  We will also start her on aspirin 81 mg once a day.  Obviously be careful with prior episode of pancreatitis.  She avoids NSAIDs.  In 1 year we will check a vascular carotid ultrasound to ensure stability.  She is not in surgical range as of now.  Continue to encourage tobacco cessation.  Fruits vegetables daily exercise.  I do not think that her headaches are associated with her carotid artery disease.  When she turns her head it hurts, could be more myofascial discomfort.

## 2021-02-23 ENCOUNTER — Other Ambulatory Visit: Payer: Self-pay | Admitting: Physical Medicine and Rehabilitation

## 2021-04-11 ENCOUNTER — Emergency Department (HOSPITAL_BASED_OUTPATIENT_CLINIC_OR_DEPARTMENT_OTHER): Payer: Medicaid Other

## 2021-04-11 ENCOUNTER — Other Ambulatory Visit: Payer: Self-pay

## 2021-04-11 ENCOUNTER — Emergency Department (HOSPITAL_COMMUNITY)
Admission: EM | Admit: 2021-04-11 | Discharge: 2021-04-11 | Disposition: A | Discharge status: Home / Self Care | Payer: Medicaid Other | Attending: Physician Assistant | Admitting: Physician Assistant

## 2021-04-11 ENCOUNTER — Emergency Department (HOSPITAL_COMMUNITY): Payer: Medicaid Other

## 2021-04-11 ENCOUNTER — Encounter (HOSPITAL_COMMUNITY): Payer: Self-pay | Admitting: Emergency Medicine

## 2021-04-11 DIAGNOSIS — M47816 Spondylosis without myelopathy or radiculopathy, lumbar region: Secondary | ICD-10-CM | POA: Insufficient documentation

## 2021-04-11 DIAGNOSIS — Z5321 Procedure and treatment not carried out due to patient leaving prior to being seen by health care provider: Secondary | ICD-10-CM | POA: Diagnosis not present

## 2021-04-11 DIAGNOSIS — M79605 Pain in left leg: Secondary | ICD-10-CM | POA: Diagnosis not present

## 2021-04-11 DIAGNOSIS — M545 Low back pain, unspecified: Secondary | ICD-10-CM | POA: Diagnosis present

## 2021-04-11 LAB — BASIC METABOLIC PANEL
Anion gap: 11 (ref 5–15)
BUN: 20 mg/dL (ref 8–23)
CO2: 22 mmol/L (ref 22–32)
Calcium: 9.7 mg/dL (ref 8.9–10.3)
Chloride: 88 mmol/L — ABNORMAL LOW (ref 98–111)
Creatinine, Ser: 1.29 mg/dL — ABNORMAL HIGH (ref 0.44–1.00)
GFR, Estimated: 46 mL/min — ABNORMAL LOW (ref >60)
Glucose, Bld: 109 mg/dL — ABNORMAL HIGH (ref 70–99)
Potassium: 4.8 mmol/L (ref 3.5–5.1)
Sodium: 121 mmol/L — ABNORMAL LOW (ref 135–145)

## 2021-04-11 LAB — CBC WITH DIFFERENTIAL/PLATELET
Abs Immature Granulocytes: 0.05 10*3/uL (ref 0.00–0.07)
Basophils Absolute: 0.1 10*3/uL (ref 0.0–0.1)
Basophils Relative: 1 %
Eosinophils Absolute: 0.1 10*3/uL (ref 0.0–0.5)
Eosinophils Relative: 0 %
HCT: 27 % — ABNORMAL LOW (ref 36.0–46.0)
Hemoglobin: 8.6 g/dL — ABNORMAL LOW (ref 12.0–15.0)
Immature Granulocytes: 0 %
Lymphocytes Relative: 31 %
Lymphs Abs: 3.6 10*3/uL (ref 0.7–4.0)
MCH: 30.8 pg (ref 26.0–34.0)
MCHC: 31.9 g/dL (ref 30.0–36.0)
MCV: 96.8 fL (ref 80.0–100.0)
Monocytes Absolute: 1 10*3/uL (ref 0.1–1.0)
Monocytes Relative: 8 %
Neutro Abs: 6.9 10*3/uL (ref 1.7–7.7)
Neutrophils Relative %: 60 %
Platelets: 568 10*3/uL — ABNORMAL HIGH (ref 150–400)
RBC: 2.79 MIL/uL — ABNORMAL LOW (ref 3.87–5.11)
RDW: 14.5 % (ref 11.5–15.5)
WBC: 11.7 10*3/uL — ABNORMAL HIGH (ref 4.0–10.5)
nRBC: 0 % (ref 0.0–0.2)

## 2021-04-11 NOTE — Progress Notes (Signed)
Lower extremity venous has been completed.   Preliminary results in CV Proc.   Lori [name] [place] 04/11/2021 4:06 PM

## 2021-04-11 NOTE — ED Provider Notes (Signed)
Emergency Medicine Provider Triage Evaluation Note  Lori [name] , a 65 y.o. female  was evaluated in triage.  Pt complains of back pain and leg pain. Has had lower back pain for 2 weeks. Pain radiating down both legs but worse on the left. Also reports discoloration to the left leg.  Review of Systems  Positive: Back pain, left leg pain Negative: ever  Physical Exam  BP (!) 116/54 (BP Location: Right Arm)   Pulse (!) 111   Temp 98.9 F (37.2 C) (Oral)   Resp 14   SpO2 97%  Gen:   Awake, no distress   Resp:  Normal effort  MSK:   Moves extremities without difficulty  Other:  No unilateral edema, dp pulses 2+ and symmetric, TTP to the lumbar spine  Medical Decision Making  Medically screening exam initiated at 2:37 PM.  Appropriate orders placed.  Lori [name] was informed that the remainder of the evaluation will be completed by another provider, this initial triage assessment does not replace that evaluation, and the importance of remaining in the ED until their evaluation is complete.     Bishop Dublin 04/11/21 1446    Davonna Belling, MD 04/12/21 1234

## 2021-04-11 NOTE — ED Triage Notes (Signed)
Pt here POV with c/o of bilateral leg pain X5 days. Denies fall/trauma. Pt states she can not walk .

## 2021-04-11 NOTE — ED Notes (Signed)
Called Pt 3x no answer moving off the floor

## 2021-04-11 NOTE — ED Notes (Signed)
Pt not responding for vital recheck.

## 2021-04-13 ENCOUNTER — Emergency Department (HOSPITAL_COMMUNITY): Payer: Medicaid Other

## 2021-04-13 ENCOUNTER — Inpatient Hospital Stay (HOSPITAL_COMMUNITY)
Admission: EM | Admit: 2021-04-13 | Discharge: 2021-05-08 | DRG: 564 | Disposition: E | Payer: Medicaid Other | Attending: Internal Medicine | Admitting: Internal Medicine

## 2021-04-13 DIAGNOSIS — M109 Gout, unspecified: Secondary | ICD-10-CM | POA: Diagnosis present

## 2021-04-13 DIAGNOSIS — Z8249 Family history of ischemic heart disease and other diseases of the circulatory system: Secondary | ICD-10-CM

## 2021-04-13 DIAGNOSIS — I2510 Atherosclerotic heart disease of native coronary artery without angina pectoris: Secondary | ICD-10-CM | POA: Diagnosis present

## 2021-04-13 DIAGNOSIS — S90811A Abrasion, right foot, initial encounter: Secondary | ICD-10-CM | POA: Diagnosis present

## 2021-04-13 DIAGNOSIS — R7989 Other specified abnormal findings of blood chemistry: Secondary | ICD-10-CM | POA: Diagnosis present

## 2021-04-13 DIAGNOSIS — N179 Acute kidney failure, unspecified: Secondary | ICD-10-CM | POA: Diagnosis present

## 2021-04-13 DIAGNOSIS — E875 Hyperkalemia: Secondary | ICD-10-CM | POA: Diagnosis present

## 2021-04-13 DIAGNOSIS — Z83438 Family history of other disorder of lipoprotein metabolism and other lipidemia: Secondary | ICD-10-CM

## 2021-04-13 DIAGNOSIS — N3289 Other specified disorders of bladder: Secondary | ICD-10-CM | POA: Diagnosis present

## 2021-04-13 DIAGNOSIS — I10 Essential (primary) hypertension: Secondary | ICD-10-CM | POA: Diagnosis present

## 2021-04-13 DIAGNOSIS — G5793 Unspecified mononeuropathy of bilateral lower limbs: Secondary | ICD-10-CM | POA: Diagnosis present

## 2021-04-13 DIAGNOSIS — R7401 Elevation of levels of liver transaminase levels: Secondary | ICD-10-CM | POA: Diagnosis present

## 2021-04-13 DIAGNOSIS — R778 Other specified abnormalities of plasma proteins: Secondary | ICD-10-CM | POA: Diagnosis not present

## 2021-04-13 DIAGNOSIS — J9601 Acute respiratory failure with hypoxia: Secondary | ICD-10-CM | POA: Diagnosis present

## 2021-04-13 DIAGNOSIS — E874 Mixed disorder of acid-base balance: Secondary | ICD-10-CM | POA: Diagnosis present

## 2021-04-13 DIAGNOSIS — E860 Dehydration: Secondary | ICD-10-CM | POA: Diagnosis present

## 2021-04-13 DIAGNOSIS — S50812A Abrasion of left forearm, initial encounter: Secondary | ICD-10-CM | POA: Diagnosis present

## 2021-04-13 DIAGNOSIS — G629 Polyneuropathy, unspecified: Secondary | ICD-10-CM | POA: Diagnosis present

## 2021-04-13 DIAGNOSIS — R531 Weakness: Secondary | ICD-10-CM

## 2021-04-13 DIAGNOSIS — I708 Atherosclerosis of other arteries: Secondary | ICD-10-CM | POA: Diagnosis present

## 2021-04-13 DIAGNOSIS — R109 Unspecified abdominal pain: Secondary | ICD-10-CM | POA: Diagnosis present

## 2021-04-13 DIAGNOSIS — F32A Depression, unspecified: Secondary | ICD-10-CM | POA: Diagnosis present

## 2021-04-13 DIAGNOSIS — F17200 Nicotine dependence, unspecified, uncomplicated: Secondary | ICD-10-CM | POA: Diagnosis present

## 2021-04-13 DIAGNOSIS — R0602 Shortness of breath: Secondary | ICD-10-CM

## 2021-04-13 DIAGNOSIS — F0390 Unspecified dementia without behavioral disturbance: Secondary | ICD-10-CM | POA: Diagnosis present

## 2021-04-13 DIAGNOSIS — K219 Gastro-esophageal reflux disease without esophagitis: Secondary | ICD-10-CM | POA: Diagnosis present

## 2021-04-13 DIAGNOSIS — W19XXXA Unspecified fall, initial encounter: Secondary | ICD-10-CM | POA: Diagnosis present

## 2021-04-13 DIAGNOSIS — F1410 Cocaine abuse, uncomplicated: Secondary | ICD-10-CM | POA: Diagnosis present

## 2021-04-13 DIAGNOSIS — T07XXXA Unspecified multiple injuries, initial encounter: Secondary | ICD-10-CM | POA: Diagnosis present

## 2021-04-13 DIAGNOSIS — R339 Retention of urine, unspecified: Secondary | ICD-10-CM | POA: Diagnosis present

## 2021-04-13 DIAGNOSIS — M6282 Rhabdomyolysis: Secondary | ICD-10-CM | POA: Diagnosis not present

## 2021-04-13 DIAGNOSIS — Z20822 Contact with and (suspected) exposure to covid-19: Secondary | ICD-10-CM | POA: Diagnosis present

## 2021-04-13 DIAGNOSIS — F17210 Nicotine dependence, cigarettes, uncomplicated: Secondary | ICD-10-CM | POA: Diagnosis present

## 2021-04-13 DIAGNOSIS — Y92009 Unspecified place in unspecified non-institutional (private) residence as the place of occurrence of the external cause: Secondary | ICD-10-CM

## 2021-04-13 DIAGNOSIS — E871 Hypo-osmolality and hyponatremia: Secondary | ICD-10-CM | POA: Diagnosis present

## 2021-04-13 DIAGNOSIS — T796XXA Traumatic ischemia of muscle, initial encounter: Principal | ICD-10-CM | POA: Diagnosis present

## 2021-04-13 DIAGNOSIS — Z8541 Personal history of malignant neoplasm of cervix uteri: Secondary | ICD-10-CM

## 2021-04-13 DIAGNOSIS — G8929 Other chronic pain: Secondary | ICD-10-CM | POA: Diagnosis present

## 2021-04-13 DIAGNOSIS — F419 Anxiety disorder, unspecified: Secondary | ICD-10-CM | POA: Diagnosis present

## 2021-04-13 DIAGNOSIS — E878 Other disorders of electrolyte and fluid balance, not elsewhere classified: Secondary | ICD-10-CM | POA: Diagnosis present

## 2021-04-13 DIAGNOSIS — E785 Hyperlipidemia, unspecified: Secondary | ICD-10-CM | POA: Diagnosis present

## 2021-04-13 DIAGNOSIS — I469 Cardiac arrest, cause unspecified: Secondary | ICD-10-CM | POA: Diagnosis not present

## 2021-04-13 DIAGNOSIS — Z28310 Unvaccinated for covid-19: Secondary | ICD-10-CM

## 2021-04-13 DIAGNOSIS — G9341 Metabolic encephalopathy: Secondary | ICD-10-CM | POA: Diagnosis present

## 2021-04-13 DIAGNOSIS — S80212A Abrasion, left knee, initial encounter: Secondary | ICD-10-CM | POA: Diagnosis present

## 2021-04-13 DIAGNOSIS — E861 Hypovolemia: Secondary | ICD-10-CM | POA: Diagnosis present

## 2021-04-13 DIAGNOSIS — Z7982 Long term (current) use of aspirin: Secondary | ICD-10-CM

## 2021-04-13 DIAGNOSIS — Z79899 Other long term (current) drug therapy: Secondary | ICD-10-CM

## 2021-04-13 LAB — I-STAT VENOUS BLOOD GAS, ED
Acid-base deficit: 11 mmol/L — ABNORMAL HIGH (ref 0.0–2.0)
Bicarbonate: 15.9 mmol/L — ABNORMAL LOW (ref 20.0–28.0)
Calcium, Ion: 1.04 mmol/L — ABNORMAL LOW (ref 1.15–1.40)
HCT: 35 % — ABNORMAL LOW (ref 36.0–46.0)
Hemoglobin: 11.9 g/dL — ABNORMAL LOW (ref 12.0–15.0)
O2 Saturation: 80 %
Potassium: 5.6 mmol/L — ABNORMAL HIGH (ref 3.5–5.1)
Sodium: 120 mmol/L — ABNORMAL LOW (ref 135–145)
TCO2: 17 mmol/L — ABNORMAL LOW (ref 22–32)
pCO2, Ven: 36.7 mmHg — ABNORMAL LOW (ref 44.0–60.0)
pH, Ven: 7.245 — ABNORMAL LOW (ref 7.250–7.430)
pO2, Ven: 52 mmHg — ABNORMAL HIGH (ref 32.0–45.0)

## 2021-04-13 LAB — I-STAT CHEM 8, ED
BUN: 46 mg/dL — ABNORMAL HIGH (ref 8–23)
Calcium, Ion: 1.03 mmol/L — ABNORMAL LOW (ref 1.15–1.40)
Chloride: 92 mmol/L — ABNORMAL LOW (ref 98–111)
Creatinine, Ser: 3.4 mg/dL — ABNORMAL HIGH (ref 0.44–1.00)
Glucose, Bld: 183 mg/dL — ABNORMAL HIGH (ref 70–99)
HCT: 34 % — ABNORMAL LOW (ref 36.0–46.0)
Hemoglobin: 11.6 g/dL — ABNORMAL LOW (ref 12.0–15.0)
Potassium: 5.6 mmol/L — ABNORMAL HIGH (ref 3.5–5.1)
Sodium: 119 mmol/L — CL (ref 135–145)
TCO2: 18 mmol/L — ABNORMAL LOW (ref 22–32)

## 2021-04-13 LAB — CBC WITH DIFFERENTIAL/PLATELET
Abs Immature Granulocytes: 0.1 10*3/uL — ABNORMAL HIGH (ref 0.00–0.07)
Basophils Absolute: 0 10*3/uL (ref 0.0–0.1)
Basophils Relative: 0 %
Eosinophils Absolute: 0.1 10*3/uL (ref 0.0–0.5)
Eosinophils Relative: 1 %
HCT: 35.9 % — ABNORMAL LOW (ref 36.0–46.0)
Hemoglobin: 12 g/dL (ref 12.0–15.0)
Immature Granulocytes: 1 %
Lymphocytes Relative: 6 %
Lymphs Abs: 1.1 10*3/uL (ref 0.7–4.0)
MCH: 31.5 pg (ref 26.0–34.0)
MCHC: 33.4 g/dL (ref 30.0–36.0)
MCV: 94.2 fL (ref 80.0–100.0)
Monocytes Absolute: 0.6 10*3/uL (ref 0.1–1.0)
Monocytes Relative: 3 %
Neutro Abs: 17.4 10*3/uL — ABNORMAL HIGH (ref 1.7–7.7)
Neutrophils Relative %: 89 %
Platelets: 674 10*3/uL — ABNORMAL HIGH (ref 150–400)
RBC: 3.81 MIL/uL — ABNORMAL LOW (ref 3.87–5.11)
RDW: 13.9 % (ref 11.5–15.5)
WBC: 19.4 10*3/uL — ABNORMAL HIGH (ref 4.0–10.5)
nRBC: 0 % (ref 0.0–0.2)

## 2021-04-13 LAB — COMPREHENSIVE METABOLIC PANEL
ALT: 504 U/L — ABNORMAL HIGH (ref 0–44)
AST: 1579 U/L — ABNORMAL HIGH (ref 15–41)
Albumin: 2.7 g/dL — ABNORMAL LOW (ref 3.5–5.0)
Alkaline Phosphatase: 273 U/L — ABNORMAL HIGH (ref 38–126)
Anion gap: 20 — ABNORMAL HIGH (ref 5–15)
BUN: 41 mg/dL — ABNORMAL HIGH (ref 8–23)
CO2: 14 mmol/L — ABNORMAL LOW (ref 22–32)
Calcium: 8.9 mg/dL (ref 8.9–10.3)
Chloride: 87 mmol/L — ABNORMAL LOW (ref 98–111)
Creatinine, Ser: 3.42 mg/dL — ABNORMAL HIGH (ref 0.44–1.00)
GFR, Estimated: 14 mL/min — ABNORMAL LOW (ref >60)
Glucose, Bld: 191 mg/dL — ABNORMAL HIGH (ref 70–99)
Potassium: 5.8 mmol/L — ABNORMAL HIGH (ref 3.5–5.1)
Sodium: 121 mmol/L — ABNORMAL LOW (ref 135–145)
Total Bilirubin: 0.8 mg/dL (ref 0.3–1.2)
Total Protein: 7.4 g/dL (ref 6.5–8.1)

## 2021-04-13 LAB — RESP PANEL BY RT-PCR (FLU A&B, COVID) ARPGX2
Influenza A by PCR: NEGATIVE
Influenza B by PCR: NEGATIVE
SARS Coronavirus 2 by RT PCR: NEGATIVE

## 2021-04-13 LAB — LACTIC ACID, PLASMA: Lactic Acid, Venous: 6 mmol/L (ref 0.5–1.9)

## 2021-04-13 LAB — CK: Total CK: >50000 U/L — ABNORMAL HIGH (ref 38–234)

## 2021-04-13 LAB — LIPASE, BLOOD: Lipase: 29 U/L (ref 11–51)

## 2021-04-13 LAB — TYPE AND SCREEN
ABO/RH(D): O POS
Antibody Screen: NEGATIVE

## 2021-04-13 LAB — TROPONIN I (HIGH SENSITIVITY): Troponin I (High Sensitivity): 662 ng/L (ref <18)

## 2021-04-13 MED ORDER — SODIUM BICARBONATE 8.4 % IV SOLN
100.0000 meq | Freq: Once | INTRAVENOUS | Status: AC
  Administered 2021-04-14: 100 meq via INTRAVENOUS
  Filled 2021-04-13: qty 50

## 2021-04-13 MED ORDER — FENTANYL CITRATE PF 50 MCG/ML IJ SOSY
25.0000 ug | PREFILLED_SYRINGE | INTRAMUSCULAR | Status: DC | PRN
Start: 2021-04-13 — End: 2021-04-14
  Administered 2021-04-14: 50 ug via INTRAVENOUS
  Filled 2021-04-13: qty 1

## 2021-04-13 MED ORDER — SODIUM CHLORIDE 0.9 % IV BOLUS: 2500.0000 mL | Freq: Once | INTRAVENOUS | Status: DC

## 2021-04-13 MED ORDER — HYDROMORPHONE HCL 1 MG/ML IJ SOLN
1.0000 mg | Freq: Once | INTRAMUSCULAR | Status: AC
  Administered 2021-04-13: 1 mg via INTRAVENOUS
  Filled 2021-04-13: qty 1

## 2021-04-13 MED ORDER — SODIUM CHLORIDE 0.9 % IV BOLUS
3000.0000 mL | Freq: Once | INTRAVENOUS | Status: AC
  Administered 2021-04-14: 3000 mL via INTRAVENOUS

## 2021-04-13 MED ORDER — LACTATED RINGERS IV BOLUS
500.0000 mL | Freq: Once | INTRAVENOUS | Status: AC
  Administered 2021-04-13: 500 mL via INTRAVENOUS

## 2021-04-13 MED ORDER — LACTATED RINGERS IV SOLN: INTRAVENOUS | Status: DC

## 2021-04-13 NOTE — ED Notes (Signed)
Patient transported to CT 
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
## 2021-04-13 NOTE — Consult Note (Signed)
NAME:  Lori [name], MRN:  [id number], DOB:  1955/10/03, LOS: 0 ADMISSION DATE:  04/09/2021, CONSULTATION DATE:  05/01/2021 REFERRING MD:  Johnney Killian CHIEF COMPLAINT:  Fall, weakness.   History of Present Illness:  Lori [name] is a 65 y.o. female who has a PMH as below.  She presented to [facility] ED 12/7 with abd pain and unwitnessed fall.  She apparently fell the day prior and had been on the floor since then.  She informed ED staff that she lowered herself to the floor, she did not collapse and she had no LOC.  She last communicated with family via text around 4pm the evening prior. She has been complaining of abd pain and diarrhea for 2 weeks.  Notes show she was seen in ED 12/5 for back and LE pain x 2 weeks, pain radiating into both legs and associated with discoloration of left leg.  She had LE dopplers that were negative.  Lumbar Xray was negative.  She was discharged home and apparently instructed to follow up with PCP. Chart review shows she was seen in ED 9/29 for similar symptoms.  In ED, she was found to have multiple metabolic derangements including rhabdomyolysis, AKI, metabolic acidosis, mild hyperkalemia, transaminitis, elevated troponin, elevated lactate.  She denies any fevers/chills/sweats, chest pain, dyspnea, myalgias. CT head, CT abd / pelv negative.  She did have LUE tingling when she first presented but this is improving. Likely 2/2 compressive neuropathy from being down.  Due to her metabolic derangements, PCCM asked to see in consultation.  Pertinent  Medical History:  has CERVICAL CANCER; TOBACCO ABUSE; Depression with anxiety; Essential hypertension; GERD; Insomnia; SNORING; Leg pain; Abdominal pain; Preventive measure; Shoulder pain, left; Caries; Nausea; Renal lesion; Otitis media of right ear; Allergic rhinitis; Encounter for screening mammogram for breast cancer; Neuropathy of both feet; Cellulitis of groin; Shin splints, sequela; Acute pancreatitis; Normocytic  anemia; Hyponatremia; Bilateral foot pain; Achilles tendon contracture, bilateral; Hx of adenomatous colonic polyps; Benign neoplasm of transverse colon; Bloating; Gastritis and gastroduodenitis; History of cervical cancer; AKI (acute kidney injury) ([place]); Hematemesis with nausea; Epigastric pain; Carotid artery stenosis, asymptomatic, bilateral; Coronary artery calcification; Aortic atherosclerosis ([place]); and Subclavian artery stenosis, left ([place]) on their problem list.  Significant Hospital Events: Including procedures, antibiotic start and stop dates in addition to other pertinent events   12/7 admit.  Interim History / Subjective:  Vitals stable. Continues to have abdominal pain.  LE pain also continues but not worse then baseline.  Objective:  Blood pressure (!) 121/93, pulse 99, temperature 98.7 F (37.1 C), resp. rate (!) 23, SpO2 100 %.        Intake/Output Summary (Last 24 hours) at 04/26/2021 2254 Last data filed at 04/18/2021 2024 Gross per 24 hour  Intake 250 ml  Output --  Net 250 ml   There were no vitals filed for this visit.  Examination: General: Adult female, chronically ill appearing, in NAD. Neuro: A&O x 3, no deficits. HEENT: [place]/AT. Sclerae anicteric. EOMI. Cardiovascular: RRR, no M/R/G.  Lungs: Respirations even and unlabored.  CTA bilaterally, No W/R/R. Abdomen: BS hypoactive.  TTP throughout, no guarding. Musculoskeletal:  No gross deformities, no edema.  Skin: Superficial wound to left foot, right knee, left arm. Skin warm, no rashes.  Labs/imaging personally reviewed:  CT head 12/7 > neg. CT abd/pelv 12/7 > neg.  Assessment & Plan:   Rhabdomyolysis - unknown downtime but presumably over 24 hours (last communication with her was at 4pm the day prior  to presentation). - Aggressive fluids, 3L NS bolus now followed by maintenance at 125.  Could probably stand to receive even more, can assess how she does after 3L and go from there. - Trend  CK.  Hyponatremia - presumed decreased PO intake, N/V/D, above. Mild hyperkalemia - presumed 2/2 above. AKI - presumed 2/2 above + home Zestoric. AGMA - 2/2 above + lactate. - Aggressive fluids as above. - Will give 2amps HCO3. - Hold nephrotoxins. - Follow BMP.  Transaminitis - presumed 2/ 2 rhabo.  She denies any excessive tylenol use but niece does say she has a hx of using Tylenol PM for sleep though hasn't used this for some time.  Does have Lipitor on home MAR. - Trend LFT's. - Check APAP levels for completeness. - Hold hepatotoxins.  Elevated troponin - presumed 2/2 above.  Do not suspect any component ACS. EKG negative. - Trend troponin.  Abd pain - unclear etiology.  Likely exacerbated by rhabdo (she states it has been worse since last night) but unsure why she has had this issue for sometime now.  CT abd/pelv negative. - Supportive care as above. - Low dose PRN fentanyl. - Consider GI consult if no improvement.  LE pain and weakness - sounds radicular per hx.  Niece states she thought pt was supposed to have an MRI as outpatient but had some issues with insurance. - Would get TOC to look into things and see if we can assist with outpatient lumbar MRI.  Remote hx cocaine abuse (last 2010). - Continue cessation. - F/u on UDS.   OK for SDU under [place].  Continue supportive care with aggressive fluids.  Trend all labs. Nothing further to add.  PCCM will sign off.  Please call us back if we can be of any further assistance.   Best practice (evaluated daily):  Per primary.  Labs   CBC: Recent Labs  Lab 04/11/21 1445 04/14/2021 2130 04/26/2021 2201  WBC 11.7* 19.4*  --   NEUTROABS 6.9 17.4*  --   HGB 8.6* 12.0 11.9*  11.6*  HCT 27.0* 35.9* 35.0*  34.0*  MCV 96.8 94.2  --   PLT 568* 674*  --     Basic Metabolic Panel: Recent Labs  Lab 04/11/21 1445 05/05/2021 2130 05/06/2021 2201  NA 121* 121* 120*  119*  K 4.8 5.8* 5.6*  5.6*  CL 88* 87* 92*  CO2 22 14*  --    GLUCOSE 109* 191* 183*  BUN 20 41* 46*  CREATININE 1.29* 3.42* 3.40*  CALCIUM 9.7 8.9  --    GFR: Estimated Creatinine Clearance: 16 mL/min (A) (by C-G formula based on SCr of 3.4 mg/dL (H)). Recent Labs  Lab 04/11/21 1445 05/07/2021 2130  WBC 11.7* 19.4*  LATICACIDVEN  --  6.0*    Liver Function Tests: Recent Labs  Lab 04/19/2021 2130  AST 1,579*  ALT 504*  ALKPHOS 273*  BILITOT 0.8  PROT 7.4  ALBUMIN 2.7*   Recent Labs  Lab 04/26/2021 2130  LIPASE 29   No results for input(s): AMMONIA in the last 168 hours.  ABG    Component Value Date/Time   HCO3 15.9 (L) 04/14/2021 2201   TCO2 18 (L) 04/28/2021 2201   TCO2 17 (L) 04/21/2021 2201   ACIDBASEDEF 11.0 (H) 04/27/2021 2201   O2SAT 80.0 04/17/2021 2201     Coagulation Profile: No results for input(s): INR, PROTIME in the last 168 hours.  Cardiac Enzymes: No results for input(s): CKTOTAL, CKMB, CKMBINDEX, TROPONINI  in the last 168 hours.  HbA1C: Hemoglobin A1C  Date/Time Value Ref Range Status  07/02/2015 03:02 PM 5.5  Final  05/10/2011 04:50 PM 5.5  Final    CBG: No results for input(s): GLUCAP in the last 168 hours.  Review of Systems:   All negative; except for those that are bolded, which indicate positives.  Constitutional: weight loss, weight gain, night sweats, fevers, chills, fatigue, weakness.  HEENT: headaches, sore throat, sneezing, nasal congestion, post nasal drip, difficulty swallowing, tooth/dental problems, visual complaints, visual changes, ear aches. Neuro: difficulty with speech, weakness, numbness, ataxia. CV:  chest pain, orthopnea, PND, swelling in lower extremities, dizziness, palpitations, syncope.  Resp: cough, hemoptysis, dyspnea, wheezing. GI: heartburn, indigestion, abdominal pain, nausea, vomiting, diarrhea, constipation, change in bowel habits, loss of appetite, hematemesis, melena, hematochezia.  GU: dysuria, change in color of urine, urgency or frequency, flank pain,  hematuria. MSK: joint pain or swelling, decreased range of motion, LE pain and weakness. Psych: change in mood or affect, depression, anxiety, suicidal ideations, homicidal ideations. Skin: rash, itching, bruising.   Past Medical History:  She,  has a past medical history of Arthritis, Depression, Depression with anxiety (2010), GERD (gastroesophageal reflux disease), Gout, Headache(784.0), History of cervical cancer ( 1985), History of cocaine abuse ([place]) (10/23/2008), Hypertension (2007), Insomnia, Tobacco abuse, and Uterine cancer ([place]).   Surgical History:   Past Surgical History:  Procedure Laterality Date   BIOPSY  02/28/2020   Procedure: BIOPSY;  Surgeon: Doran Stabler, MD;  Location: [facility] ENDOSCOPY;  Service: Gastroenterology;;   [place]   COLONOSCOPY WITH PROPOFOL N/A 03/01/2017   Procedure: COLONOSCOPY WITH PROPOFOL;  Surgeon: Milus Banister, MD;  Location: [facility] ENDOSCOPY;  Service: Endoscopy;  Laterality: N/A;   ESOPHAGOGASTRODUODENOSCOPY (EGD) WITH PROPOFOL N/A 03/01/2017   Procedure: ESOPHAGOGASTRODUODENOSCOPY (EGD) WITH PROPOFOL;  Surgeon: Milus Banister, MD;  Location: [facility] ENDOSCOPY;  Service: Endoscopy;  Laterality: N/A;   ESOPHAGOGASTRODUODENOSCOPY (EGD) WITH PROPOFOL N/A 02/28/2020   Procedure: ESOPHAGOGASTRODUODENOSCOPY (EGD) WITH PROPOFOL;  Surgeon: Doran Stabler, MD;  Location: [facility];  Service: Gastroenterology;  Laterality: N/A;   PARTIAL HYSTERECTOMY     SKIN GRAFT       Social History:   reports that she has been smoking cigarettes. She has a 21.50 pack-year smoking history. She has never used smokeless tobacco. She reports that she does not drink alcohol and does not use drugs.   Family History:  Her family history includes Colon cancer in her maternal uncle; Colon polyps in her brother; Diabetes in her brother and father; Heart disease in her father; Hyperlipidemia in her brother and father; Hypertension in her brother and father;  Mental illness in her daughter; Obesity in her daughter; Thyroid disease in her daughter and mother. There is no history of Stomach cancer or Pancreatic cancer.   Allergies No Known Allergies   Home Medications  Prior to Admission medications   Medication Sig Start Date End Date Taking? Authorizing Provider  acetaminophen (TYLENOL) 325 MG tablet Take 2 tablets (650 mg total) by mouth every 6 (six) hours as needed. 11/03/19   Darr, Edison Nasuti, PA-C  acetaminophen (TYLENOL) 500 MG tablet Take 500-1,000 mg by mouth every 8 (eight) hours as needed (for headaches).    [provider]  albuterol (VENTOLIN HFA) 108 (90 Base) MCG/ACT inhaler Inhale 1-2 puffs into the lungs every 6 (six) hours as needed for wheezing or shortness of breath. 11/03/19   Darr, Edison Nasuti, PA-C  ALPRAZolam Duanne Moron) 0.5  MG tablet Take 0.5-1 mg by mouth See admin instructions. Take 0.5-1 mg by mouth at bedtime and an additional 0.5 mg up to two times a day as needed for anxiety    [provider]  aspirin EC 81 MG tablet Take 1 tablet (81 mg total) by mouth daily. Swallow whole. 02/10/21   Jerline Pain, MD  atorvastatin (LIPITOR) 40 MG tablet Take 1 tablet (40 mg total) by mouth daily. 02/10/21   Jerline Pain, MD  cyclobenzaprine (FLEXERIL) 10 MG tablet Take 10 mg by mouth 3 (three) times daily.    [provider]  gabapentin (NEURONTIN) 300 MG capsule TAKE 1 CAPSULE BY MOUTH EVERYDAY AT BEDTIME 11/09/17   Newt Minion, MD  gabapentin (NEURONTIN) 400 MG capsule Take 400 mg by mouth 4 (four) times daily. 01/17/21   [provider]  lisinopril-hydrochlorothiazide (ZESTORETIC) 20-12.5 MG tablet Take 1 tablet by mouth at bedtime. 12/27/20   [provider]  pantoprazole (PROTONIX) 40 MG tablet Take 1 tablet (40 mg total) by mouth 2 (two) times daily. 03/05/17 04/05/21  Milus Banister, MD  QUEtiapine (SEROQUEL) 100 MG tablet Take 100 mg by mouth in the morning and at bedtime.    [provider]  zolpidem (AMBIEN) 5 MG tablet Take 5 mg by mouth at bedtime. 01/25/21   [provider]      Montey Hora, [place] For pager details, please see AMION or use Epic chat  After 1900, please call [facility] for cross coverage needs 04/22/2021, 10:54 PM

## 2021-04-14 ENCOUNTER — Inpatient Hospital Stay (HOSPITAL_COMMUNITY): Payer: Medicaid Other

## 2021-04-14 ENCOUNTER — Encounter (HOSPITAL_COMMUNITY): Payer: Self-pay | Admitting: Interventional Radiology

## 2021-04-14 DIAGNOSIS — M6282 Rhabdomyolysis: Secondary | ICD-10-CM | POA: Diagnosis present

## 2021-04-14 DIAGNOSIS — I2510 Atherosclerotic heart disease of native coronary artery without angina pectoris: Secondary | ICD-10-CM | POA: Diagnosis present

## 2021-04-14 DIAGNOSIS — R7989 Other specified abnormal findings of blood chemistry: Secondary | ICD-10-CM | POA: Diagnosis present

## 2021-04-14 DIAGNOSIS — E875 Hyperkalemia: Secondary | ICD-10-CM | POA: Diagnosis present

## 2021-04-14 DIAGNOSIS — R778 Other specified abnormalities of plasma proteins: Secondary | ICD-10-CM

## 2021-04-14 DIAGNOSIS — R7401 Elevation of levels of liver transaminase levels: Secondary | ICD-10-CM | POA: Diagnosis present

## 2021-04-14 DIAGNOSIS — G9341 Metabolic encephalopathy: Secondary | ICD-10-CM | POA: Diagnosis present

## 2021-04-14 DIAGNOSIS — T796XXA Traumatic ischemia of muscle, initial encounter: Secondary | ICD-10-CM | POA: Diagnosis present

## 2021-04-14 DIAGNOSIS — W19XXXA Unspecified fall, initial encounter: Secondary | ICD-10-CM | POA: Diagnosis present

## 2021-04-14 DIAGNOSIS — N3289 Other specified disorders of bladder: Secondary | ICD-10-CM | POA: Diagnosis present

## 2021-04-14 DIAGNOSIS — K219 Gastro-esophageal reflux disease without esophagitis: Secondary | ICD-10-CM | POA: Diagnosis present

## 2021-04-14 DIAGNOSIS — F419 Anxiety disorder, unspecified: Secondary | ICD-10-CM | POA: Diagnosis present

## 2021-04-14 DIAGNOSIS — R1010 Upper abdominal pain, unspecified: Secondary | ICD-10-CM

## 2021-04-14 DIAGNOSIS — E860 Dehydration: Secondary | ICD-10-CM | POA: Diagnosis present

## 2021-04-14 DIAGNOSIS — Z8541 Personal history of malignant neoplasm of cervix uteri: Secondary | ICD-10-CM | POA: Diagnosis not present

## 2021-04-14 DIAGNOSIS — R1012 Left upper quadrant pain: Secondary | ICD-10-CM

## 2021-04-14 DIAGNOSIS — T07XXXA Unspecified multiple injuries, initial encounter: Secondary | ICD-10-CM

## 2021-04-14 DIAGNOSIS — N179 Acute kidney failure, unspecified: Secondary | ICD-10-CM | POA: Diagnosis present

## 2021-04-14 DIAGNOSIS — E871 Hypo-osmolality and hyponatremia: Secondary | ICD-10-CM | POA: Diagnosis present

## 2021-04-14 DIAGNOSIS — R079 Chest pain, unspecified: Secondary | ICD-10-CM | POA: Diagnosis not present

## 2021-04-14 DIAGNOSIS — G5793 Unspecified mononeuropathy of bilateral lower limbs: Secondary | ICD-10-CM

## 2021-04-14 DIAGNOSIS — F0390 Unspecified dementia without behavioral disturbance: Secondary | ICD-10-CM | POA: Diagnosis present

## 2021-04-14 DIAGNOSIS — Z20822 Contact with and (suspected) exposure to covid-19: Secondary | ICD-10-CM | POA: Diagnosis present

## 2021-04-14 DIAGNOSIS — I739 Peripheral vascular disease, unspecified: Secondary | ICD-10-CM | POA: Diagnosis not present

## 2021-04-14 DIAGNOSIS — I708 Atherosclerosis of other arteries: Secondary | ICD-10-CM | POA: Diagnosis present

## 2021-04-14 DIAGNOSIS — J9601 Acute respiratory failure with hypoxia: Secondary | ICD-10-CM

## 2021-04-14 DIAGNOSIS — I10 Essential (primary) hypertension: Secondary | ICD-10-CM | POA: Diagnosis present

## 2021-04-14 DIAGNOSIS — F1410 Cocaine abuse, uncomplicated: Secondary | ICD-10-CM | POA: Diagnosis present

## 2021-04-14 DIAGNOSIS — Y92009 Unspecified place in unspecified non-institutional (private) residence as the place of occurrence of the external cause: Secondary | ICD-10-CM | POA: Diagnosis not present

## 2021-04-14 DIAGNOSIS — E874 Mixed disorder of acid-base balance: Secondary | ICD-10-CM | POA: Diagnosis present

## 2021-04-14 DIAGNOSIS — F32A Depression, unspecified: Secondary | ICD-10-CM | POA: Diagnosis present

## 2021-04-14 DIAGNOSIS — G8929 Other chronic pain: Secondary | ICD-10-CM | POA: Diagnosis present

## 2021-04-14 DIAGNOSIS — G629 Polyneuropathy, unspecified: Secondary | ICD-10-CM | POA: Diagnosis present

## 2021-04-14 DIAGNOSIS — R531 Weakness: Secondary | ICD-10-CM

## 2021-04-14 DIAGNOSIS — I469 Cardiac arrest, cause unspecified: Secondary | ICD-10-CM | POA: Diagnosis not present

## 2021-04-14 HISTORY — PX: IR US GUIDE VASC ACCESS RIGHT: IMG2390

## 2021-04-14 HISTORY — PX: IR FLUORO GUIDE CV LINE RIGHT: IMG2283

## 2021-04-14 LAB — BASIC METABOLIC PANEL
Anion gap: 9 (ref 5–15)
Anion gap: 15 (ref 5–15)
BUN: 42 mg/dL — ABNORMAL HIGH (ref 8–23)
BUN: 53 mg/dL — ABNORMAL HIGH (ref 8–23)
CO2: 21 mmol/L — ABNORMAL LOW (ref 22–32)
CO2: 18 mmol/L — ABNORMAL LOW (ref 22–32)
Calcium: 7.2 mg/dL — ABNORMAL LOW (ref 8.9–10.3)
Calcium: 7.6 mg/dL — ABNORMAL LOW (ref 8.9–10.3)
Chloride: 96 mmol/L — ABNORMAL LOW (ref 98–111)
Chloride: 97 mmol/L — ABNORMAL LOW (ref 98–111)
Creatinine, Ser: 2.96 mg/dL — ABNORMAL HIGH (ref 0.44–1.00)
Creatinine, Ser: 3.71 mg/dL — ABNORMAL HIGH (ref 0.44–1.00)
GFR, Estimated: 17 mL/min — ABNORMAL LOW (ref >60)
GFR, Estimated: 13 mL/min — ABNORMAL LOW (ref >60)
Glucose, Bld: 158 mg/dL — ABNORMAL HIGH (ref 70–99)
Glucose, Bld: 139 mg/dL — ABNORMAL HIGH (ref 70–99)
Potassium: 6 mmol/L — ABNORMAL HIGH (ref 3.5–5.1)
Potassium: 6.7 mmol/L (ref 3.5–5.1)
Sodium: 126 mmol/L — ABNORMAL LOW (ref 135–145)
Sodium: 130 mmol/L — ABNORMAL LOW (ref 135–145)

## 2021-04-14 LAB — RENAL FUNCTION PANEL
Albumin: 2 g/dL — ABNORMAL LOW (ref 3.5–5.0)
Anion gap: 17 — ABNORMAL HIGH (ref 5–15)
BUN: 49 mg/dL — ABNORMAL HIGH (ref 8–23)
CO2: 17 mmol/L — ABNORMAL LOW (ref 22–32)
Calcium: 8 mg/dL — ABNORMAL LOW (ref 8.9–10.3)
Chloride: 95 mmol/L — ABNORMAL LOW (ref 98–111)
Creatinine, Ser: 3.45 mg/dL — ABNORMAL HIGH (ref 0.44–1.00)
GFR, Estimated: 14 mL/min — ABNORMAL LOW (ref >60)
Glucose, Bld: 127 mg/dL — ABNORMAL HIGH (ref 70–99)
Phosphorus: 6.8 mg/dL — ABNORMAL HIGH (ref 2.5–4.6)
Potassium: 6.5 mmol/L (ref 3.5–5.1)
Sodium: 129 mmol/L — ABNORMAL LOW (ref 135–145)

## 2021-04-14 LAB — URINALYSIS, MICROSCOPIC (REFLEX): Squamous Epithelial / HPF: NONE SEEN (ref 0–5)

## 2021-04-14 LAB — RAPID URINE DRUG SCREEN, HOSP PERFORMED
Amphetamines: NOT DETECTED
Barbiturates: NOT DETECTED
Benzodiazepines: POSITIVE — AB
Cocaine: POSITIVE — AB
Opiates: POSITIVE — AB
Tetrahydrocannabinol: NOT DETECTED

## 2021-04-14 LAB — CK: Total CK: >50000 U/L — ABNORMAL HIGH (ref 38–234)

## 2021-04-14 LAB — BRAIN NATRIURETIC PEPTIDE: B Natriuretic Peptide: 380.2 pg/mL — ABNORMAL HIGH (ref 0.0–100.0)

## 2021-04-14 LAB — RETICULOCYTES
Immature Retic Fract: 31 % — ABNORMAL HIGH (ref 2.3–15.9)
RBC.: 3.54 MIL/uL — ABNORMAL LOW (ref 3.87–5.11)
Retic Count, Absolute: 61.2 10*3/uL (ref 19.0–186.0)
Retic Ct Pct: 1.7 % (ref 0.4–3.1)

## 2021-04-14 LAB — CBC WITH DIFFERENTIAL/PLATELET
Abs Immature Granulocytes: 0.86 10*3/uL — ABNORMAL HIGH (ref 0.00–0.07)
Band Neutrophils: 20 %
Basophils Absolute: 0 10*3/uL (ref 0.0–0.1)
Basophils Relative: 0 %
Blasts: 0 %
Eosinophils Absolute: 0 10*3/uL (ref 0.0–0.5)
Eosinophils Relative: 0 %
HCT: 34 % — ABNORMAL LOW (ref 36.0–46.0)
Hemoglobin: 11.4 g/dL — ABNORMAL LOW (ref 12.0–15.0)
Lymphocytes Relative: 21 %
Lymphs Abs: 2 10*3/uL (ref 0.7–4.0)
MCH: 31 pg (ref 26.0–34.0)
MCHC: 33.5 g/dL (ref 30.0–36.0)
MCV: 92.4 fL (ref 80.0–100.0)
Metamyelocytes Relative: 9 %
Monocytes Absolute: 0.2 10*3/uL (ref 0.1–1.0)
Monocytes Relative: 2 %
Myelocytes: 0 %
Neutro Abs: 6.5 10*3/uL (ref 1.7–7.7)
Neutrophils Relative %: 48 %
Other: 0 %
Platelets: 565 10*3/uL — ABNORMAL HIGH (ref 150–400)
Promyelocytes Relative: 0 %
RBC: 3.68 MIL/uL — ABNORMAL LOW (ref 3.87–5.11)
RDW: 14.1 % (ref 11.5–15.5)
WBC: 9.6 10*3/uL (ref 4.0–10.5)
nRBC: 0 % (ref 0.0–0.2)
nRBC: 0 /100 WBC

## 2021-04-14 LAB — I-STAT ARTERIAL BLOOD GAS, ED
Acid-base deficit: 9 mmol/L — ABNORMAL HIGH (ref 0.0–2.0)
Bicarbonate: 15.5 mmol/L — ABNORMAL LOW (ref 20.0–28.0)
Calcium, Ion: 1.14 mmol/L — ABNORMAL LOW (ref 1.15–1.40)
HCT: 35 % — ABNORMAL LOW (ref 36.0–46.0)
Hemoglobin: 11.9 g/dL — ABNORMAL LOW (ref 12.0–15.0)
O2 Saturation: 100 %
Potassium: 5.2 mmol/L — ABNORMAL HIGH (ref 3.5–5.1)
Sodium: 126 mmol/L — ABNORMAL LOW (ref 135–145)
TCO2: 16 mmol/L — ABNORMAL LOW (ref 22–32)
pCO2 arterial: 27.5 mmHg — ABNORMAL LOW (ref 32.0–48.0)
pH, Arterial: 7.359 (ref 7.350–7.450)
pO2, Arterial: 261 mmHg — ABNORMAL HIGH (ref 83.0–108.0)

## 2021-04-14 LAB — HEPATIC FUNCTION PANEL
ALT: 575 U/L — ABNORMAL HIGH (ref 0–44)
AST: 1844 U/L — ABNORMAL HIGH (ref 15–41)
Albumin: 1.9 g/dL — ABNORMAL LOW (ref 3.5–5.0)
Alkaline Phosphatase: 269 U/L — ABNORMAL HIGH (ref 38–126)
Bilirubin, Direct: 0.2 mg/dL (ref 0.0–0.2)
Indirect Bilirubin: 0.3 mg/dL (ref 0.3–0.9)
Total Bilirubin: 0.5 mg/dL (ref 0.3–1.2)
Total Protein: 5.5 g/dL — ABNORMAL LOW (ref 6.5–8.1)

## 2021-04-14 LAB — CREATININE, URINE, RANDOM: Creatinine, Urine: 100.37 mg/dL

## 2021-04-14 LAB — HEPATITIS PANEL, ACUTE
HCV Ab: NONREACTIVE
Hep A IgM: NONREACTIVE
Hep B C IgM: NONREACTIVE
Hepatitis B Surface Ag: NONREACTIVE

## 2021-04-14 LAB — TROPONIN I (HIGH SENSITIVITY)
Troponin I (High Sensitivity): 642 ng/L (ref <18)
Troponin I (High Sensitivity): 494 ng/L (ref <18)

## 2021-04-14 LAB — COMPREHENSIVE METABOLIC PANEL
ALT: 513 U/L — ABNORMAL HIGH (ref 0–44)
AST: 1647 U/L — ABNORMAL HIGH (ref 15–41)
Albumin: 1.9 g/dL — ABNORMAL LOW (ref 3.5–5.0)
Alkaline Phosphatase: 261 U/L — ABNORMAL HIGH (ref 38–126)
Anion gap: 15 (ref 5–15)
BUN: 47 mg/dL — ABNORMAL HIGH (ref 8–23)
CO2: 16 mmol/L — ABNORMAL LOW (ref 22–32)
Calcium: 9.9 mg/dL (ref 8.9–10.3)
Chloride: 96 mmol/L — ABNORMAL LOW (ref 98–111)
Creatinine, Ser: 3.35 mg/dL — ABNORMAL HIGH (ref 0.44–1.00)
GFR, Estimated: 15 mL/min — ABNORMAL LOW (ref >60)
Glucose, Bld: 190 mg/dL — ABNORMAL HIGH (ref 70–99)
Potassium: 5.4 mmol/L — ABNORMAL HIGH (ref 3.5–5.1)
Sodium: 127 mmol/L — ABNORMAL LOW (ref 135–145)
Total Bilirubin: 0.6 mg/dL (ref 0.3–1.2)
Total Protein: 5.2 g/dL — ABNORMAL LOW (ref 6.5–8.1)

## 2021-04-14 LAB — IRON AND TIBC
Iron: 25 ug/dL — ABNORMAL LOW (ref 28–170)
Saturation Ratios: 16 % (ref 10.4–31.8)
TIBC: 155 ug/dL — ABNORMAL LOW (ref 250–450)
UIBC: 130 ug/dL

## 2021-04-14 LAB — URINALYSIS, ROUTINE W REFLEX MICROSCOPIC
Glucose, UA: NEGATIVE mg/dL
Ketones, ur: NEGATIVE mg/dL
Leukocytes,Ua: NEGATIVE
Nitrite: POSITIVE — AB
Protein, ur: 100 mg/dL — AB
Specific Gravity, Urine: 1.02 (ref 1.005–1.030)
pH: 6.5 (ref 5.0–8.0)

## 2021-04-14 LAB — PHOSPHORUS
Phosphorus: 6.4 mg/dL — ABNORMAL HIGH (ref 2.5–4.6)
Phosphorus: 6.5 mg/dL — ABNORMAL HIGH (ref 2.5–4.6)

## 2021-04-14 LAB — C-REACTIVE PROTEIN: CRP: 21.5 mg/dL — ABNORMAL HIGH (ref <1.0)

## 2021-04-14 LAB — CORTISOL-AM, BLOOD: Cortisol - AM: 79.8 ug/dL — ABNORMAL HIGH (ref 6.7–22.6)

## 2021-04-14 LAB — LIPID PANEL
Cholesterol: 108 mg/dL (ref 0–200)
HDL: 34 mg/dL — ABNORMAL LOW (ref >40)
LDL Cholesterol: 57 mg/dL (ref 0–99)
Total CHOL/HDL Ratio: 3.2 RATIO
Triglycerides: 87 mg/dL (ref <150)
VLDL: 17 mg/dL (ref 0–40)

## 2021-04-14 LAB — HIV ANTIBODY (ROUTINE TESTING W REFLEX): HIV Screen 4th Generation wRfx: NONREACTIVE

## 2021-04-14 LAB — OSMOLALITY, URINE: Osmolality, Ur: 280 mOsm/kg — ABNORMAL LOW (ref 300–900)

## 2021-04-14 LAB — TSH: TSH: 0.807 u[IU]/mL (ref 0.350–4.500)

## 2021-04-14 LAB — PREALBUMIN: Prealbumin: 5.7 mg/dL — ABNORMAL LOW (ref 18–38)

## 2021-04-14 LAB — PROTIME-INR
INR: 1.6 — ABNORMAL HIGH (ref 0.8–1.2)
Prothrombin Time: 18.6 seconds — ABNORMAL HIGH (ref 11.4–15.2)

## 2021-04-14 LAB — ECHOCARDIOGRAM COMPLETE
Area-P 1/2: 5.97 cm2
S' Lateral: 2.7 cm

## 2021-04-14 LAB — ETHANOL: Alcohol, Ethyl (B): <10 mg/dL (ref <10)

## 2021-04-14 LAB — MAGNESIUM
Magnesium: 2.1 mg/dL (ref 1.7–2.4)
Magnesium: 2 mg/dL (ref 1.7–2.4)

## 2021-04-14 LAB — HEMOGLOBIN A1C
Hgb A1c MFr Bld: 6.4 % — ABNORMAL HIGH (ref 4.8–5.6)
Mean Plasma Glucose: 136.98 mg/dL

## 2021-04-14 LAB — SODIUM, URINE, RANDOM: Sodium, Ur: 25 mmol/L

## 2021-04-14 LAB — FOLATE: Folate: 24.7 ng/mL (ref >5.9)

## 2021-04-14 LAB — LACTIC ACID, PLASMA
Lactic Acid, Venous: 3.2 mmol/L (ref 0.5–1.9)
Lactic Acid, Venous: 2.2 mmol/L (ref 0.5–1.9)

## 2021-04-14 LAB — GAMMA GT: GGT: 22 U/L (ref 7–50)

## 2021-04-14 LAB — SEDIMENTATION RATE: Sed Rate: 107 mm/hr — ABNORMAL HIGH (ref 0–22)

## 2021-04-14 LAB — ACETAMINOPHEN LEVEL: Acetaminophen (Tylenol), Serum: <10 ug/mL — ABNORMAL LOW (ref 10–30)

## 2021-04-14 LAB — VITAMIN B12: Vitamin B-12: 937 pg/mL — ABNORMAL HIGH (ref 180–914)

## 2021-04-14 LAB — AMMONIA: Ammonia: 28 umol/L (ref 9–35)

## 2021-04-14 LAB — URIC ACID: Uric Acid, Serum: 10.2 mg/dL — ABNORMAL HIGH (ref 2.5–7.1)

## 2021-04-14 LAB — PROCALCITONIN: Procalcitonin: 9.2 ng/mL

## 2021-04-14 LAB — OSMOLALITY: Osmolality: 286 mOsm/kg (ref 275–295)

## 2021-04-14 LAB — FERRITIN: Ferritin: 4433 ng/mL — ABNORMAL HIGH (ref 11–307)

## 2021-04-14 MED ORDER — LACTATED RINGERS IV SOLN: INTRAVENOUS | Status: DC

## 2021-04-14 MED ORDER — INSULIN ASPART 100 UNIT/ML IV SOLN
5.0000 [IU] | Freq: Once | INTRAVENOUS | Status: AC
  Administered 2021-04-14: 5 [IU] via INTRAVENOUS

## 2021-04-14 MED ORDER — THIAMINE HCL 100 MG/ML IJ SOLN
500.0000 mg | Freq: Once | INTRAVENOUS | Status: AC
  Administered 2021-04-14: 500 mg via INTRAVENOUS
  Filled 2021-04-14: qty 5

## 2021-04-14 MED ORDER — SODIUM CHLORIDE 0.9 % IV SOLN
75.0000 mL/h | INTRAVENOUS | Status: AC
  Administered 2021-04-14: 75 mL/h via INTRAVENOUS

## 2021-04-14 MED ORDER — CHLORHEXIDINE GLUCONATE CLOTH 2 % EX PADS: 6.0000 | MEDICATED_PAD | Freq: Every day | CUTANEOUS | Status: DC

## 2021-04-14 MED ORDER — SODIUM CHLORIDE 0.9 % IV SOLN: 100.0000 mg | Freq: Two times a day (BID) | INTRAVENOUS | Status: DC

## 2021-04-14 MED ORDER — INSULIN ASPART 100 UNIT/ML IV SOLN
10.0000 [IU] | Freq: Once | INTRAVENOUS | Status: AC
  Administered 2021-04-14: 10 [IU] via INTRAVENOUS

## 2021-04-14 MED ORDER — SODIUM BICARBONATE 8.4 % IV SOLN
INTRAVENOUS | Status: AC
  Filled 2021-04-14: qty 50

## 2021-04-14 MED ORDER — SODIUM ZIRCONIUM CYCLOSILICATE 10 G PO PACK
10.0000 g | PACK | Freq: Three times a day (TID) | ORAL | Status: DC
  Administered 2021-04-14: 10 g via ORAL
  Filled 2021-04-14: qty 1

## 2021-04-14 MED ORDER — DEXTROSE 50 % IV SOLN: 1.0000 | Freq: Once | INTRAVENOUS | Status: DC

## 2021-04-14 MED ORDER — HEPARIN SODIUM (PORCINE) 1000 UNIT/ML IJ SOLN
INTRAMUSCULAR | Status: DC | PRN
  Administered 2021-04-14: 2600 [IU] via INTRAVENOUS

## 2021-04-14 MED ORDER — CALCIUM GLUCONATE-NACL 2-0.675 GM/100ML-% IV SOLN
2.0000 g | Freq: Once | INTRAVENOUS | Status: AC
  Administered 2021-04-14: 2000 mg via INTRAVENOUS
  Filled 2021-04-14 (×2): qty 100

## 2021-04-14 MED ORDER — SODIUM POLYSTYRENE SULFONATE 15 GM/60ML PO SUSP
30.0000 g | Freq: Once | ORAL | Status: AC
  Administered 2021-04-14: 30 g via ORAL
  Filled 2021-04-14: qty 120

## 2021-04-14 MED ORDER — TAMSULOSIN HCL 0.4 MG PO CAPS
0.4000 mg | ORAL_CAPSULE | Freq: Every day | ORAL | Status: DC
  Administered 2021-04-14: 0.4 mg via ORAL
  Filled 2021-04-14: qty 1

## 2021-04-14 MED ORDER — ACETAMINOPHEN 325 MG PO TABS
650.0000 mg | ORAL_TABLET | Freq: Four times a day (QID) | ORAL | Status: DC | PRN
  Administered 2021-04-15: 650 mg via ORAL
  Filled 2021-04-14: qty 2

## 2021-04-14 MED ORDER — SODIUM ZIRCONIUM CYCLOSILICATE 10 G PO PACK
10.0000 g | PACK | Freq: Three times a day (TID) | ORAL | Status: AC
  Administered 2021-04-14 (×2): 10 g via ORAL
  Filled 2021-04-14 (×2): qty 1

## 2021-04-14 MED ORDER — LIDOCAINE HCL 1 % IJ SOLN
INTRAMUSCULAR | Status: DC | PRN
  Administered 2021-04-14: 2 mL

## 2021-04-14 MED ORDER — PERFLUTREN LIPID MICROSPHERE
1.0000 mL | INTRAVENOUS | Status: AC | PRN
Start: 2021-04-14 — End: 2021-04-14
  Administered 2021-04-14: 2 mL via INTRAVENOUS
  Filled 2021-04-14: qty 10

## 2021-04-14 MED ORDER — LIDOCAINE HCL 1 % IJ SOLN
INTRAMUSCULAR | Status: AC
  Filled 2021-04-14: qty 20

## 2021-04-14 MED ORDER — ONDANSETRON HCL 4 MG/2ML IJ SOLN: 4.0000 mg | Freq: Four times a day (QID) | INTRAMUSCULAR | Status: DC | PRN

## 2021-04-14 MED ORDER — SODIUM CHLORIDE 0.9 % IV SOLN
100.0000 mg | Freq: Once | INTRAVENOUS | Status: AC
  Administered 2021-04-14: 100 mg via INTRAVENOUS
  Filled 2021-04-14: qty 100

## 2021-04-14 MED ORDER — THIAMINE HCL 100 MG/ML IJ SOLN: 100.0000 mg | Freq: Every day | INTRAMUSCULAR | Status: DC

## 2021-04-14 MED ORDER — SODIUM ZIRCONIUM CYCLOSILICATE 10 G PO PACK
10.0000 g | PACK | Freq: Once | ORAL | Status: AC
  Administered 2021-04-14: 10 g via ORAL
  Filled 2021-04-14: qty 1

## 2021-04-14 MED ORDER — DEXTROSE 50 % IV SOLN
1.0000 | Freq: Once | INTRAVENOUS | Status: AC
  Administered 2021-04-14: 50 mL via INTRAVENOUS
  Filled 2021-04-14: qty 50

## 2021-04-14 MED ORDER — CALCIUM GLUCONATE-NACL 1-0.675 GM/50ML-% IV SOLN
1.0000 g | Freq: Once | INTRAVENOUS | Status: AC
  Administered 2021-04-14: 1000 mg via INTRAVENOUS
  Filled 2021-04-14 (×2): qty 50

## 2021-04-14 MED ORDER — SODIUM BICARBONATE 8.4 % IV SOLN
50.0000 meq | Freq: Once | INTRAVENOUS | Status: AC
  Administered 2021-04-14: 50 meq via INTRAVENOUS
  Filled 2021-04-14: qty 50

## 2021-04-14 MED ORDER — ZINC OXIDE 40 % EX OINT
TOPICAL_OINTMENT | Freq: Every day | CUTANEOUS | Status: DC | PRN
  Filled 2021-04-14: qty 57

## 2021-04-14 MED ORDER — SODIUM BICARBONATE 650 MG PO TABS: 650.0000 mg | ORAL_TABLET | Freq: Three times a day (TID) | ORAL | Status: DC

## 2021-04-14 MED ORDER — SODIUM BICARBONATE 650 MG PO TABS
650.0000 mg | ORAL_TABLET | Freq: Three times a day (TID) | ORAL | Status: DC
  Administered 2021-04-14: 650 mg via ORAL
  Filled 2021-04-14: qty 1

## 2021-04-14 MED ORDER — SODIUM CHLORIDE 0.9 % IV BOLUS
500.0000 mL | Freq: Once | INTRAVENOUS | Status: AC
  Administered 2021-04-14: 500 mL via INTRAVENOUS

## 2021-04-14 MED ORDER — FUROSEMIDE 10 MG/ML IJ SOLN
80.0000 mg | Freq: Once | INTRAMUSCULAR | Status: AC
  Administered 2021-04-14: 80 mg via INTRAVENOUS
  Filled 2021-04-14: qty 8

## 2021-04-14 MED ORDER — HEPARIN SODIUM (PORCINE) 1000 UNIT/ML IJ SOLN
INTRAMUSCULAR | Status: AC
  Filled 2021-04-14: qty 10

## 2021-04-14 MED ORDER — PANTOPRAZOLE SODIUM 40 MG PO TBEC
40.0000 mg | DELAYED_RELEASE_TABLET | Freq: Two times a day (BID) | ORAL | Status: DC
  Administered 2021-04-14 (×2): 40 mg via ORAL
  Filled 2021-04-14 (×2): qty 1

## 2021-04-14 MED ORDER — ASPIRIN EC 81 MG PO TBEC
81.0000 mg | DELAYED_RELEASE_TABLET | Freq: Every day | ORAL | Status: DC
  Administered 2021-04-14: 81 mg via ORAL
  Filled 2021-04-14: qty 1

## 2021-04-14 MED ORDER — HEPARIN SODIUM (PORCINE) 5000 UNIT/ML IJ SOLN
5000.0000 [IU] | Freq: Three times a day (TID) | INTRAMUSCULAR | Status: DC
  Administered 2021-04-14 (×2): 5000 [IU] via SUBCUTANEOUS
  Filled 2021-04-14 (×3): qty 1

## 2021-04-14 MED ORDER — HYDRALAZINE HCL 20 MG/ML IJ SOLN: 10.0000 mg | Freq: Four times a day (QID) | INTRAMUSCULAR | Status: DC | PRN

## 2021-04-14 NOTE — Progress Notes (Signed)
NAME:  Lori [name], MRN:  [id number], DOB:  20-Apr-1956, LOS: 0 ADMISSION DATE:  04/28/2021, CONSULTATION DATE:  04/07/2021 REFERRING MD:  Johnney Killian CHIEF COMPLAINT:  Fall, weakness.   History of Present Illness:  Lori [name] is a 65 y.o. female who has a PMH as below.  She presented to [facility] ED 12/7 with abd pain and unwitnessed fall.  She apparently fell the day prior and had been on the floor since then.  She informed ED staff that she lowered herself to the floor, she did not collapse and she had no LOC.  She last communicated with family via text around 4pm the evening prior. She has been complaining of abd pain and diarrhea for 2 weeks.  Notes show she was seen in ED 12/5 for back and LE pain x 2 weeks, pain radiating into both legs and associated with discoloration of left leg.  She had LE dopplers that were negative.  Lumbar Xray was negative.  She was discharged home and apparently instructed to follow up with PCP. Chart review shows she was seen in ED 9/29 for similar symptoms.  In ED, she was found to have multiple metabolic derangements including rhabdomyolysis, AKI, metabolic acidosis, mild hyperkalemia, transaminitis, elevated troponin, elevated lactate.  She denies any fevers/chills/sweats, chest pain, dyspnea, myalgias. CT head, CT abd / pelv negative.  She did have LUE tingling when she first presented but this is improving. Likely 2/2 compressive neuropathy from being down.  Due to her metabolic derangements, PCCM asked to see in consultation. 04/14/2021 called back for respiratory distress  Pertinent  Medical History:  has CERVICAL CANCER; TOBACCO ABUSE; Depression with anxiety; Essential hypertension; GERD; Insomnia; SNORING; Leg pain; Abdominal pain; Preventive measure; Shoulder pain, left; Caries; Nausea; Renal lesion; Otitis media of right ear; Allergic rhinitis; Encounter for screening mammogram for breast cancer; Neuropathy of both feet; Cellulitis of groin; Shin  splints, sequela; Acute pancreatitis; Normocytic anemia; Hyponatremia; Bilateral foot pain; Achilles tendon contracture, bilateral; Hx of adenomatous colonic polyps; Benign neoplasm of transverse colon; Bloating; Gastritis and gastroduodenitis; History of cervical cancer; AKI (acute kidney injury) ([place]); Hematemesis with nausea; Epigastric pain; Carotid artery stenosis, asymptomatic, bilateral; Coronary artery calcification; Aortic atherosclerosis ([place]); Subclavian artery stenosis, left ([place]); Rhabdomyolysis; Elevated LFTs; Left-sided weakness; Elevated troponin; Wounds, multiple; and Hyperkalemia on their problem list.  Significant Hospital Events: Including procedures, antibiotic start and stop dates in addition to other pertinent events   12/7 admit.  Interim History / Subjective:  Vitals stable. Continues to have abdominal pain.  LE pain also continues but not worse then baseline.  Objective:  Blood pressure 130/90, pulse (!) 119, temperature (!) 97.5 F (36.4 C), temperature source Oral, resp. rate (!) 56, SpO2 98 %.        Intake/Output Summary (Last 24 hours) at 04/14/2021 0753 Last data filed at 04/14/2021 0046 Gross per 24 hour  Intake 750 ml  Output --  Net 750 ml   There were no vitals filed for this visit.  Examination: General: 65 year old female currently on BiPAP but arouses follows commands.  No JVD or lymphadenopathy is appreciated poor dentition is noted HEENT: MM pink/moist Neuro: Somewhat lethargic but follows commands complains of pain in the abdomen CV: Currently sinus tach suspect component of bladder distention  PULM: Decreased breath sounds throughout on 60%   GI: soft, pain to palpation GU: Extremities: warm/dry, 1+ edema  Skin:         Labs/imaging personally reviewed:  CT head 12/7 > neg.  CT abd/pelv 12/7 > neg.  Assessment & Plan:   Rhabdomyolysis - unknown downtime but presumably over 24 hours (last communication with her was at 4pm the  day prior to presentation). Additional 500 cc NS Monitor CK  Sudden onset of respiratory distress during the night currently on 60% FiO2 unfortunately her O2 sat monitor unreliable. Continue to monitor respiratory status Trial of being off BiPAP Hold further Dilaudid Her chest x-ray is clear      Hyponatremia - presumed decreased PO intake, N/V/D, above. Mild hyperkalemia - presumed 2/2 above. AKI - presumed 2/2 above + home Zestoric. AGMA - 2/2 above + lactate. Recent Labs  Lab 04/26/2021 2130 04/24/2021 2201 04/14/21 0251  NA 121* 120*  119* 126*   Recent Labs  Lab 04/24/2021 2130 04/12/2021 2201 04/14/21 0251  K 5.8* 5.6*  5.6* 6.0*    Lab Results  Component Value Date   CREATININE 2.96 (H) 04/14/2021   CREATININE 3.40 (H) 04/27/2021   CREATININE 3.42 (H) 04/07/2021   CREATININE 1.00 12/02/2014   CREATININE 0.80 06/12/2012   CREATININE 0.92 11/20/2011  Continue IV fluids MRI shows bladder is distended we will place Foley this may be the cause of tachycardia Repeat bicarb Avoid nephrotoxins Renal consults been called I suspect this is hydronephrosis     Transaminitis - presumed 2/ 2 rhabo.  She denies any excessive tylenol use but niece does say she has a hx of using Tylenol PM for sleep though hasn't used this for some time.  Does have Lipitor on home MAR. Monitor LFTs Toxicology screen    Elevated troponin - presumed 2/2 above.  Do not suspect any component ACS. EKG negative. Trend troponin  Abd pain - unclear etiology.  Likely exacerbated by rhabdo (she states it has been worse since last night) but unsure why she has had this issue for sometime now.  CT abd/pelv negative. CT of the abdomen without acute findings Avoid narcotics as able   Remote hx cocaine abuse (last 2010). Toxicology screen     Best practice (evaluated daily):  Per primary.  Labs   CBC: Recent Labs  Lab 04/11/21 1445 04/12/2021 2130 04/10/2021 2201 04/14/21 0251  WBC 11.7*  19.4*  --  9.6  NEUTROABS 6.9 17.4*  --  6.5  HGB 8.6* 12.0 11.9*  11.6* 11.4*  HCT 27.0* 35.9* 35.0*  34.0* 34.0*  MCV 96.8 94.2  --  92.4  PLT 568* 674*  --  565*    Basic Metabolic Panel: Recent Labs  Lab 04/11/21 1445 05/04/2021 2130 04/29/2021 2201 04/14/21 0251  NA 121* 121* 120*  119* 126*  K 4.8 5.8* 5.6*  5.6* 6.0*  CL 88* 87* 92* 96*  CO2 22 14*  --  21*  GLUCOSE 109* 191* 183* 158*  BUN 20 41* 46* 42*  CREATININE 1.29* 3.42* 3.40* 2.96*  CALCIUM 9.7 8.9  --  7.2*  MG  --   --   --  2.1  PHOS  --   --   --  6.4*   GFR: Estimated Creatinine Clearance: 18.3 mL/min (A) (by C-G formula based on SCr of 2.96 mg/dL (H)). Recent Labs  Lab 04/11/21 1445 04/11/2021 2130 04/14/21 0140 04/14/21 0251  PROCALCITON  --   --  9.20  --   WBC 11.7* 19.4*  --  9.6  LATICACIDVEN  --  6.0* 3.2* 2.2*    Liver Function Tests: Recent Labs  Lab 05/06/2021 2130 04/14/21 0551  AST 1,579* 1,844*  ALT 504*  575*  ALKPHOS 273* 269*  BILITOT 0.8 0.5  PROT 7.4 5.5*  ALBUMIN 2.7* 1.9*   Recent Labs  Lab 04/12/2021 2130  LIPASE 29   Recent Labs  Lab 04/14/21 0140  AMMONIA 28    ABG    Component Value Date/Time   HCO3 15.9 (L) 05/02/2021 2201   TCO2 18 (L) 04/27/2021 2201   TCO2 17 (L) 04/19/2021 2201   ACIDBASEDEF 11.0 (H) 04/12/2021 2201   O2SAT 80.0 04/29/2021 2201     Coagulation Profile: Recent Labs  Lab 04/14/21 0140  INR 1.6*    Cardiac Enzymes: Recent Labs  Lab 04/29/2021 2130  CKTOTAL >50,000*    HbA1C: Hemoglobin A1C  Date/Time Value Ref Range Status  07/02/2015 03:02 PM 5.5  Final  05/10/2011 04:50 PM 5.5  Final    CBG: No results for input(s): GLUCAP in the last 168 hours.     Richardson Landry Carlisia Geno ACNP Acute Care Nurse Practitioner [place] Please consult Amion 04/14/2021, 7:57 AM

## 2021-04-14 NOTE — ED Notes (Signed)
CCM at bedside 

## 2021-04-14 NOTE — ED Notes (Signed)
Notified pharmacy to verify meds. In process now.

## 2021-04-14 NOTE — Consult Note (Addendum)
WOC Nurse Consult Note: Patient receiving care in [facility] ED 034 Reason for Consult: Leg wounds Wound type: Scattered abrasions on the left forearm, left leg medial side at the knee and lateral side of the right foot. (See photos in media taken yesterday and this morning) MASD/IAD buttocks Pressure Injury POA: NA Measurement: Left knee wounds, upper is 6 x 3, lower is 2 x 1.5, Left forearm 4 x 2, Right lateral foot 15 x 4 Wound bed: See photos Dressing procedure/placement/frequency: The right foot was cleaned with soap and water. Applied Xeroform gauze over the wound and wrapped the foot with Kerlix.  The left upper leg: Xeroform cut to fit and placed over the wounds, secured with foam dressing. Left forearm: Xeroform cut to fit and placed over the wound, secured with foam dressing.  RN came in and placed a foley and cleaned her up. Will order Desitin to be used for MASD/IAD  Monitor the wound area(s) for worsening of condition such as: Signs/symptoms of infection, increase in size, development of or worsening of odor, development of pain, or increased pain at the affected locations.   Notify the medical team if any of these develop.  Consider Additional Pressure Injury Prevention Measures such as:  Support surfaces (air mattress) chair cushion Kellie Simmering # [phone]) Heel offloading boots Kellie Simmering # [phone]) Turning and Positioning  Measures to reduce shear (draw sheet, knees up) Skin protection Products (Foam dressing) Moisture management products (Critic-Aid Barrier Cream-Purple top) Sween moisturizing lotion (Pink top in clean supply) Nutrition Management Protection for Medical Devices Routine Skin Assessment   Thank you for the consult. [place] nurse will not follow at this time.   Please re-consult the [place] team if needed.  Cathlean Marseilles Tamala Julian, MSN, RN, [place], Lysle Pearl, [facility] Wound Treatment Associate Pager [phone]

## 2021-04-14 NOTE — Progress Notes (Signed)
PROGRESS NOTE                                                                                                                                                                                                             Patient Demographics:    Lori [name], is a 65 y.o. female, DOB - December 25, 1955, KXF:[id number]  Outpatient Primary MD for the patient is Alvester Chou, NP    LOS - 0  Admit date - 04/10/2021    Chief Complaint  Patient presents with   Abdominal Pain   Fall       Brief Narrative (HPI from H&P)     65 y.o. 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   Subjective:    Ramiro Harvest today has, No headache, No chest pain, +ve abd pain and SOB.   Assessment  & Plan :     AKI, fall related rhabdomyolysis, abdominal pain and shortness of breath - she evidently has severe rhabdomyolysis causing her AKI, also had preceding diarrhea with dehydration and hyponatremia.  Upon physical exam patient has massively distended bladder almost above her navel, Foley catheter has been placed with several liters of urine return which appears quite dark, for now continue IV fluids however due to severe hyponatremia will involve nephrology.  Continue to monitor with supportive care, shortness of breath is considerably improved after Foley catheter placement.  Is no sepsis or infection stop antibiotics   2.  Severe hyponatremia.  Kindly see #1 above, serum osmolality along with urine electrolytes ordered.  Nephrology on board, clinically appears dehydrated.  3.  Urinary tract outflow traction.  Foley and Flomax.  4.  Metabolic acidosis with  respiratory alkalosis.  Bicarb has dropped due to respiratory alkalosis which is happening due to severe distress from bladder distention, also has some element of AKI causing metabolic acidosis.  Supportive care.  Monitor.  No further bicarb administration.  5.  Ongoing lower extremity discomfort.  For now appears to be secondary to fall and prolonged downtime, once her acute issues have resolved will reevaluate,  CT head and MRI brain although limited but nonacute, neurology on board.  6.  Underlying dementia.  Supportive care at risk for delirium.  Avoid narcotics and benzodiazepines.  7.  Transaminitis.  Due to severe rhabdomyolysis.  Monitor.  8.  Hypertension.  Due to distress from bladder distention.  Supportive care.  9.  Tobacco abuse.  Counseled to quit  10.  Peripheral neuropathy.  For now monitor.  11.  Hyperkalemia due to AKI.  L[place]elma.  Monitor.  12.  Mildly elevated troponin in non-ACS pattern.  Could be due to demand mismatch caused by severe respiratory distress and tachycardia along with hypotension due to abdominal distress, trend is flat, no chest pain, check echocardiogram to evaluate EF for motion. Trend is downtrending.      Condition - Extremely Guarded  Family Communication  :  Rosann Auerbach (niece) [phone] - 04/14/21  Code Status :  Full  Consults  :  Renal, PCCM, Neuro  PUD Prophylaxis : PPI   Procedures  :     Echocardiogram.    MRI/MRA brain.  Limited study but no acute findings.    CT head and C-spine.  Nonacute  Lower extremity venous duplex.  Nonacute      Disposition Plan  :    Status is: Inpatient  Remains inpatient appropriate because: AKI.   DVT Prophylaxis  :  Heparin added  Lab Results  Component Value Date   PLT 565 (H) 04/14/2021    Diet :  Diet Order     None        Inpatient Medications  Scheduled Meds:  sodium zirconium cyclosilicate  10 g Oral TID   [START ON May 09, 2021] thiamine injection  100 mg Intravenous  Daily   Continuous Infusions:  doxycycline (VIBRAMYCIN) IV     lactated ringers 75 mL/hr at 04/14/21 0919   PRN Meds:.fentaNYL (SUBLIMAZE) injection, liver oil-zinc oxide  Antibiotics  :    Anti-infectives (From admission, onward)    Start     Dose/Rate Route Frequency Ordered Stop   04/14/21 1600  doxycycline (VIBRAMYCIN) 100 mg in sodium chloride 0.9 % 250 mL IVPB        100 mg 125 mL/hr over 120 Minutes Intravenous Every 12 hours 04/14/21 0232     04/14/21 0245  doxycycline (VIBRAMYCIN) 100 mg in sodium chloride 0.9 % 250 mL IVPB        100 mg 125 mL/hr over 120 Minutes Intravenous  Once 04/14/21 0234 04/14/21 1443        Time Spent in minutes  30   [name] M.D on 04/14/2021 at 9:42 AM  To page go to [email or web address]   [facility] -  Office  [phone]  See all Orders from today for further details    Objective:   Vitals:   04/09/2021 2345 04/14/21 0454 04/14/21 0709 04/14/21 0900  BP: 118/84 130/90  (!) 141/66  Pulse: (!) 102 (!) 116 (!) 119   Resp: 16 (!) 24 (!) 56   Temp:  (!) 97.5 F (36.4 C)    TempSrc:  Oral    SpO2: 100% 100% 98%     Wt Readings from Last 3 Encounters:  04/11/21 72.6 kg  02/10/21 75.8 kg  02/03/21 77.1 kg     Intake/Output Summary (Last 24 hours) at 04/14/2021 0942 Last data filed at 04/14/2021 0046 Gross per 24 hour  Intake 750 ml  Output --  Net 750 ml     Physical Exam  Awake Alert, No new  F.N deficits, in severe distress due to abdominal pain and bladder distention. [place].AT,PERRAL Supple Neck, No JVD,   Symmetrical Chest wall movement, Good air movement bilaterally, CTAB RRR,No Gallops,Rubs or new Murmurs,  +ve B.Sounds, Abd has severe suprapubic fullness and tenderness on palpation No Cyanosis, multiple lower extremity wounds kindly see wound care note for details.   Data Review:    CBC Recent Labs  Lab 04/11/21 1445 04/30/2021 2130 04/28/2021 2201 04/14/21 0251 04/14/21 0824  WBC 11.7* 19.4*  --   9.6  --   HGB 8.6* 12.0 11.9*  11.6* 11.4* 11.9*  HCT 27.0* 35.9* 35.0*  34.0* 34.0* 35.0*  PLT 568* 674*  --  565*  --   MCV 96.8 94.2  --  92.4  --   MCH 30.8 31.5  --  31.0  --   MCHC 31.9 33.4  --  33.5  --   RDW 14.5 13.9  --  14.1  --   LYMPHSABS 3.6 1.1  --  2.0  --   MONOABS 1.0 0.6  --  0.2  --   EOSABS 0.1 0.1  --  0.0  --   BASOSABS 0.1 0.0  --  0.0  --     Electrolytes Recent Labs  Lab 04/11/21 1445 04/23/2021 2130 04/09/2021 2201 04/14/21 0140 04/14/21 0251 04/14/21 0551 04/14/21 0810 04/14/21 0824  NA 121* 121* 120*  119*  --  126*  --   --  126*  K 4.8 5.8* 5.6*  5.6*  --  6.0*  --   --  5.2*  CL 88* 87* 92*  --  96*  --   --   --   CO2 22 14*  --   --  21*  --   --   --   GLUCOSE 109* 191* 183*  --  158*  --   --   --   BUN 20 41* 46*  --  42*  --   --   --   CREATININE 1.29* 3.42* 3.40*  --  2.96*  --   --   --   CALCIUM 9.7 8.9  --   --  7.2*  --   --   --   AST  --  1,579*  --   --   --  1,844*  --   --   ALT  --  504*  --   --   --  575*  --   --   ALKPHOS  --  273*  --   --   --  269*  --   --   BILITOT  --  0.8  --   --   --  0.5  --   --   ALBUMIN  --  2.7*  --   --   --  1.9*  --   --   MG  --   --   --   --  2.1  --   --   --   PROCALCITON  --   --   --  9.20  --   --   --   --   LATICACIDVEN  --  6.0*  --  3.2* 2.2*  --   --   --   INR  --   --   --  1.6*  --   --   --   --   TSH  --   --   --  0.807  --   --   --   --  HGBA1C  --   --   --   --   --   --  6.4*  --   AMMONIA  --   --   --  28  --   --   --   --     ------------------------------------------------------------------------------------------------------------------ Recent Labs    04/14/21 0551  CHOL 108  HDL 34*  LDLCALC 57  TRIG 87  CHOLHDL 3.2    Lab Results  Component Value Date   HGBA1C 6.4 (H) 04/14/2021    Recent Labs    04/14/21 0140  TSH 0.807    ------------------------------------------------------------------------------------------------------------------ ID Labs Recent Labs  Lab 04/11/21 1445 04/14/2021 2130 04/17/2021 2201 04/14/21 0140 04/14/21 0251  WBC 11.7* 19.4*  --   --  9.6  PLT 568* 674*  --   --  565*  PROCALCITON  --   --   --  9.20  --   LATICACIDVEN  --  6.0*  --  3.2* 2.2*  CREATININE 1.29* 3.42* 3.40*  --  2.96*   Cardiac Enzymes No results for input(s): CKMB, TROPONINI, MYOGLOBIN in the last 168 hours.  Invalid input(s): CK   Radiology Reports CT ABDOMEN PELVIS WO CONTRAST  Result Date: 04/18/2021 CLINICAL DATA:  Fall 2 days ago with abdominal pain, initial encounter EXAM: CT ABDOMEN AND PELVIS WITHOUT CONTRAST TECHNIQUE: Multidetector CT imaging of the abdomen and pelvis was performed following the standard protocol without IV contrast. COMPARISON:  02/29/2020 FINDINGS: Lower chest: No acute abnormality. Hepatobiliary: No focal liver abnormality is seen. No gallstones, gallbladder wall thickening, or biliary dilatation. Pancreas: Unremarkable. No pancreatic ductal dilatation or surrounding inflammatory changes. Spleen: Normal in size without focal abnormality. Adrenals/Urinary Tract: Adrenal glands are within normal limits. Kidneys demonstrate no renal calculi or urinary tract obstructive changes. A few cysts are noted within left kidney stable in appearance from the prior exam. The bladder is well distended. No ureteral stones are seen. Stomach/Bowel: Mild fecal material is noted within the colon consistent with a degree of constipation. No obstructive or inflammatory changes are noted. The appendix is within normal limits. Small bowel and stomach are unremarkable. Vascular/Lymphatic: Aortic atherosclerosis. No enlarged abdominal or pelvic lymph nodes. Reproductive: Status post hysterectomy. No adnexal masses. Other: No abdominal wall hernia or abnormality. No abdominopelvic ascites. Musculoskeletal: Mild  degenerative changes of lumbar spine are noted. IMPRESSION: Mild constipation. No findings to correspond with the given clinical history. Electronically Signed   By: Inez Catalina M.D.   On: 04/24/2021 23:03   DG Lumbar Spine Complete  Result Date: 04/11/2021 CLINICAL DATA:  Severe pain going into left leg, worse over the last 2 months. Chronic back pain. EXAM: LUMBAR SPINE - COMPLETE 4+ VIEW COMPARISON:  Lumbar spine radiographs 11/18/2019. CT 04/12/2020. FINDINGS: There are 5 lumbar type vertebral bodies demonstrating a minimal convex right scoliosis centered at L1. At L4-5, there is a degenerative grade 1 anterolisthesis. Chronic disc space narrowing and endplate osteophytes at L5-S1. There are mild facet degenerative changes inferiorly. No evidence of acute fracture or pars defect. Aortoiliac atherosclerosis noted. IMPRESSION: Stable lumbar spondylosis and degenerative grade 1 anterolisthesis at L4-5. No acute osseous findings. Electronically Signed   By: Richardean Sale M.D.   On: 04/11/2021 15:48   CT Head Wo Contrast  Result Date: 05/05/2021 CLINICAL DATA:  Recent fall yesterday with headaches and neck pain, initial EN EXAM: CT HEAD WITHOUT CONTRAST CT CERVICAL SPINE WITHOUT CONTRAST TECHNIQUE: Multidetector CT imaging of the head and cervical spine was performed following  the standard protocol without intravenous contrast. Multiplanar CT image reconstructions of the cervical spine were also generated. COMPARISON:  02/03/2021 FINDINGS: CT HEAD FINDINGS Brain: No evidence of acute infarction, hemorrhage, hydrocephalus, extra-axial collection or mass lesion/mass effect. 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 Vascular: No hyperdense vessel or unexpected calcification. Skull: Normal. Negative for fracture or focal lesion. Sinuses/Orbits: Mild mucosal changes are noted within the right sphenoid sinus. Other: None. CT CERVICAL SPINE FINDINGS Alignment: Within normal  limits. Skull base and vertebrae: None cervical segments are well visualized. Disc space narrowing is noted at C5-6 and C6-7 with associated osteophytic changes. Mild facet hypertrophic changes are noted. No acute fracture or acute facet abnormality is seen. Soft tissues and spinal canal: Surrounding soft tissue structures are within normal limits. Vascular calcifications are seen. Upper chest: Visualized lung apices are unremarkable. Other: None IMPRESSION: CT of the head: Mild mucosal changes in the right sphenoid sinus. No acute intracranial abnormality noted. CT of the cervical spine: Multilevel degenerative change without acute abnormality. Electronically Signed   By: Inez Catalina M.D.   On: 04/22/2021 23:00   CT CERVICAL SPINE WO CONTRAST  Result Date: 04/14/2021 CLINICAL DATA:  Recent fall yesterday with headaches and neck pain, initial EN EXAM: CT HEAD WITHOUT CONTRAST CT CERVICAL SPINE WITHOUT CONTRAST TECHNIQUE: Multidetector CT imaging of the head and cervical spine was performed following the standard protocol without intravenous contrast. Multiplanar CT image reconstructions of the cervical spine were also generated. COMPARISON:  02/03/2021 FINDINGS: CT HEAD FINDINGS Brain: No evidence of acute infarction, hemorrhage, hydrocephalus, extra-axial collection or mass lesion/mass effect. 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 Vascular: No hyperdense vessel or unexpected calcification. Skull: Normal. Negative for fracture or focal lesion. Sinuses/Orbits: Mild mucosal changes are noted within the right sphenoid sinus. Other: None. CT CERVICAL SPINE FINDINGS Alignment: Within normal limits. Skull base and vertebrae: None cervical segments are well visualized. Disc space narrowing is noted at C5-6 and C6-7 with associated osteophytic changes. Mild facet hypertrophic changes are noted. No acute fracture or acute facet abnormality is seen. Soft tissues and spinal  canal: Surrounding soft tissue structures are within normal limits. Vascular calcifications are seen. Upper chest: Visualized lung apices are unremarkable. Other: None IMPRESSION: CT of the head: Mild mucosal changes in the right sphenoid sinus. No acute intracranial abnormality noted. CT of the cervical spine: Multilevel degenerative change without acute abnormality. Electronically Signed   By: Inez Catalina M.D.   On: 04/09/2021 23:00   MR ANGIO HEAD WO CONTRAST  Result Date: 04/14/2021 CLINICAL DATA:  65 year old female with fall. Pain. Headaches. Left lower extremity weakness. Confused. EXAM: MRA HEAD WITHOUT CONTRAST TECHNIQUE: Angiographic images of the Circle of Willis were acquired using MRA technique without intravenous contrast. COMPARISON:  Brain MRI and neck MRA today. FINDINGS: Time-of-flight vessel signal is substantially degraded by metallic susceptibility artifact emanating from the left face. Subsequently, intracranial MRA of the anterior circulation is largely nondiagnostic. However, antegrade flow signal is detected in the distal vertebral arteries and basilar. And the right PICA origin, left AICA, SCA, and PCA origins also appear patent. IMPRESSION: Largely nondiagnostic intracranial MRA due to left face associated metal susceptibility artifact. Patency of the posterior circulation is evident. Electronically Signed   By: Genevie Ann M.D.   On: 04/14/2021 06:56   MR ANGIO NECK WO CONTRAST  Result Date: 04/14/2021 CLINICAL DATA:  65 year old female with fall. Pain. Headaches.  Left lower extremity weakness. Confused. EXAM: MRA NECK WITHOUT CONTRAST TECHNIQUE: Angiographic images of the neck were acquired using MRA technique without intravenous contrast. Carotid stenosis measurements (when applicable) are obtained utilizing NASCET criteria, using the distal internal carotid diameter as the denominator. COMPARISON:  Brain and cervical spine MRI today. CTA head and neck 02/03/2021. FINDINGS:  Noncontrast time-of-flight neck MRA imaging. 3 vessel arch configuration demonstrated. Antegrade flow signal in the bilateral cervical carotid and vertebral arteries which appears to continue to the skull base. Tortuosity of the right CCA in the lower neck at or just below the level of the thyroid (series 14, image 109), also demonstrated on the September CTA. But questionable associated hemodynamically significant stenosis there based on loss of MRA flow signal today. Streak artifact there from right subclavian venous contrast in September, but possible 60% stenosis by CTA. Right carotid bifurcation and visible cervical right ICA appear within normal limits. Mildly tortuous proximal left CCA. Left carotid bifurcation and visible cervical left ICA appear within normal limits. Patent proximal subclavian arteries and vertebral artery origins. Mildly dominant appearing left vertebral artery with no evidence of hemodynamically significant stenosis in the neck. IMPRESSION: 1. Cannot exclude hemodynamically significant stenosis of the Right CCA at the thoracic inlet, where tortuosity limited vessel detail on the September CTA. 2. But no other evidence of cervical carotid or vertebral artery hemodynamically significant stenosis. Electronically Signed   By: Genevie Ann M.D.   On: 04/14/2021 06:53   MR BRAIN WO CONTRAST  Result Date: 04/14/2021 CLINICAL DATA:  65 year old female with fall. Pain. Headaches. Left lower extremity weakness. Confused. EXAM: MRI HEAD WITHOUT CONTRAST TECHNIQUE: Multiplanar, multiecho pulse sequences of the brain and surrounding structures were obtained without intravenous contrast. COMPARISON:  Head CT 04/24/2021 and earlier. FINDINGS: Brain: Susceptibility artifact related to retained round metallic foreign body in the left buccal space seen by CT. This degrades some portions of the exam, especially DWI and SWI. No restricted diffusion identified. No midline shift, mass effect, evidence of mass  lesion, ventriculomegaly, extra-axial collection or acute intracranial hemorrhage. Cervicomedullary junction and pituitary are within normal limits. Subcortical oval encephalomalacia in the left frontal operculum. Mild regional white matter T2 and FLAIR hyperintensity. No cortical encephalomalacia identified. Other white matter signal appears normal for age. Deep gray matter nuclei, brainstem and cerebellum appear negative. Vascular: Major intracranial vascular flow voids are preserved. Skull and upper cervical spine: Dedicated cervical spine MRI reported separately. Negative visible cervical spine. Visualized bone marrow signal is within normal limits. Sinuses/Orbits: Left orbit and paranasal sinuses partially obscured by susceptibility artifact. Negative right orbit. Small volume layering fluid in the sphenoid sinuses where bubbly opacity was noted recently. Other: Mastoids are clear. Visible internal auditory structures appear normal. IMPRESSION: 1. Suboptimal exam due to left face metallic foreign body susceptibility artifact, which significantly affects DWI and SWI. 2. No acute intracranial abnormality identified. 3. Small area of chronic encephalomalacia at the left frontal operculum. Electronically Signed   By: Genevie Ann M.D.   On: 04/14/2021 06:43   MR CERVICAL SPINE WO CONTRAST  Result Date: 04/14/2021 CLINICAL DATA:  65 year old female with fall. Pain. Headaches. Left lower extremity weakness. Confused. EXAM: MRI CERVICAL SPINE WITHOUT CONTRAST TECHNIQUE: Multiplanar, multisequence MR imaging of the cervical spine was performed. No intravenous contrast was administered. COMPARISON:  Brain MRI today reported separately. CTA neck 02/03/2021. FINDINGS: Alignment: Increased cervical lordosis compared to the September CTA. No spondylolisthesis. Vertebrae: Mild susceptibility artifact at the skull base related to left face metallic  foreign body. No convincing marrow edema or evidence of acute osseous  abnormality. Normal background bone marrow signal. Cord: Capacious spinal canal at most levels. No spinal cord signal abnormality identified despite up to mild degenerative cord mass effect at C5 and C6, below. Negative visible upper thoracic spinal canal and spinal cord. Posterior Fossa, vertebral arteries, paraspinal tissues: Cervicomedullary junction is within normal limits. Brain is detailed separately. Preserved major vascular flow voids in the neck. Stable subcentimeter right thyroid STIR hyperintense nodule Not clinically significant; no follow-up imaging recommended (ref: J Am Coll Radiol. 2015 Feb;12(2): 143-50).Negative other visible neck soft tissues, lung apices. Disc levels: C2-C3:  Negative. C3-C4: Small central disc protrusion (series 12, image 22). Mild facet hypertrophy. No associated stenosis. C4-C5:  Negative disc.  Mild facet hypertrophy.  No stenosis. C5-C6: Disc space loss. Circumferential disc osteophyte complex with broad-based posterior component. Mild facet and ligament flavum hypertrophy. Foraminal stenosis greater on the right. Mild spinal stenosis with up to mild spinal cord mass effect. Mild left but moderate to severe right C6 foraminal stenosis. C6-C7: Disc space loss. Circumferential disc bulge with mild endplate spurring. Mild facet and ligament flavum hypertrophy. Mild spinal stenosis. No cord mass effect. Mild C7 foraminal stenosis. C7-T1: Mild to moderate facet hypertrophy greater on the left. No stenosis. 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 Up to mild spinal cord mass effect at the former, but no cord signal abnormality identified. Associated moderate to severe right C6 neural foraminal stenosis. Electronically Signed   By: Genevie Ann M.D.   On: 04/14/2021 06:49   DG CHEST PORT 1 VIEW  Result Date: 04/14/2021 CLINICAL DATA:  65 year old female with history of shortness of breath. EXAM:  PORTABLE CHEST 1 VIEW COMPARISON:  Chest x-ray 04/30/2021. FINDINGS: Lung volumes are low. No consolidative airspace disease. No pleural effusions. No pneumothorax. No pulmonary nodule or mass noted. Pulmonary vasculature and the cardiomediastinal silhouette are within normal limits. Atherosclerosis in the thoracic aorta. IMPRESSION: 1. Low lung volumes without radiographic evidence of acute cardiopulmonary disease. 2. Aortic atherosclerosis Electronically Signed   By: Vinnie Langton M.D.   On: 04/14/2021 06:38   DG Chest Port 1 View  Result Date: 05/05/2021 CLINICAL DATA:  Abdominal pain.  Unwitnessed fall. EXAM: PORTABLE CHEST 1 VIEW COMPARISON:  10/29/2020. FINDINGS: The heart size and mediastinal contours are within normal limits. Mild atherosclerotic calcification of the aorta is noted. Both lungs are clear. No acute osseous abnormality. IMPRESSION: No acute cardiopulmonary process. Electronically Signed   By: Brett Fairy M.D.   On: 04/20/2021 21:45   VAS [place] LOWER EXTREMITY VENOUS (DVT) (7a-7p)  Result Date: 04/11/2021  Lower Venous DVT Study Patient Name:  HEILEY SHAIKH  Date of Exam:   04/11/2021 Medical Rec #: [id number]             Accession #:    [id number] Date of Birth: 08-22-1955              Patient Gender: F Patient Age:   49 years Exam Location:  [facility] Procedure:      VAS [place] LOWER EXTREMITY VENOUS (DVT) Referring Phys: Coral Ceo --------------------------------------------------------------------------------  Performing Technologist: Archie Patten RVS  Examination Guidelines: A complete evaluation includes B-mode imaging, spectral Doppler, color Doppler, and power Doppler as needed of all accessible portions of each vessel. Bilateral testing is considered an integral part of a complete examination. Limited examinations for reoccurring indications may be performed as  noted. The reflux portion of the exam is performed with the patient in reverse Trendelenburg.   +-----+---------------+---------+-----------+----------+--------------+ RIGHTCompressibilityPhasicitySpontaneityPropertiesThrombus Aging +-----+---------------+---------+-----------+----------+--------------+ CFV  Full           Yes      Yes                                 +-----+---------------+---------+-----------+----------+--------------+   +---------+---------------+---------+-----------+----------+--------------+ LEFT     CompressibilityPhasicitySpontaneityPropertiesThrombus Aging +---------+---------------+---------+-----------+----------+--------------+ CFV      Full           Yes      Yes                                 +---------+---------------+---------+-----------+----------+--------------+ SFJ      Full                                                        +---------+---------------+---------+-----------+----------+--------------+ FV Prox  Full                                                        +---------+---------------+---------+-----------+----------+--------------+ FV Mid   Full                                                        +---------+---------------+---------+-----------+----------+--------------+ FV DistalFull                                                        +---------+---------------+---------+-----------+----------+--------------+ PFV      Full                                                        +---------+---------------+---------+-----------+----------+--------------+ POP      Full           Yes      Yes                                 +---------+---------------+---------+-----------+----------+--------------+ PTV      Full                                                        +---------+---------------+---------+-----------+----------+--------------+ PERO     Full                                                         +---------+---------------+---------+-----------+----------+--------------+  Summary: RIGHT: - No evidence of common femoral vein obstruction.  LEFT: - There is no evidence of deep vein thrombosis in the lower extremity.  - No cystic structure found in the popliteal fossa.  *See table(s) above for measurements and observations. Electronically signed by Monica Martinez MD on 04/11/2021 at 5:13:08 PM.    Final

## 2021-04-14 NOTE — Progress Notes (Signed)
ABI has been completed.   Preliminary results in CV Proc.   Lori [name] 04/14/2021 2:07 PM

## 2021-04-14 NOTE — ED Notes (Signed)
Patient returned from MRI.

## 2021-04-14 NOTE — Significant Event (Signed)
I was notified by patient's nurse that patient has become short of breath and hypoxic saturating around 70% on nonrebreather.  On exam at bedside patient appears anxious.  Denies any chest pain. Blood pressure is 130/80 pulse is 125/min temperature is 97.8 and respirations are 30/min. Chest -bilateral air entry present. Heart S1-S2 heard. Abdomen appears soft. Neuro -alert and awake following commands.  Pupils are reacting to light.  I reviewed patient's chart labs medications.  Assessment -   Acute respiratory failure with hypoxia -cause not clear.  Chest x-ray does not show anything acute.  We were unable to get an ABG.  We placed patient on BiPAP.  I am going to consult pulmonary critical care.  We will closely monitor her respiratory status.  Hyperkalemia with acute renal failure-was noticed in the labs.  Patient already received Lokelma.  I ordered calcium gluconate D50 and IV insulin.  Elevated troponins -appears to be stable.  Patient denies any chest pain.  Check 2D echo.  Rhabdomyolysis -follow CK levels.  Patient received fluid bolus in the ER.  Patient likely will need more fluids.    Updated patient's sister Ms. Caldwell.   Gean Birchwood

## 2021-04-14 NOTE — ED Notes (Signed)
When pt returned from IR, was notified foley bag was leaking. Foley bag has been changed.

## 2021-04-14 NOTE — Evaluation (Signed)
Occupational Therapy Evaluation Patient Details Name: Lori [name] MRN: [id number] DOB: 1955/11/09 Today's Date: 04/14/2021   History of Present Illness Pt is a 65 y/o female admitted 12/7 after being found down. Pt with Rhabdomyolysis,  AKI, metabolic acidosis, mild hyperkalemia, transaminitis. PMH includes tobacco use, HTN, cocaine abuse, cervical cancer, and gout.   Clinical Impression   Limited ability to participate in evaluation due to discomfort, significant weakness, anxiety and impaired cognition. Pt demonstrating no voluntary movement on L side with pain in shoulders with PROM. She requires total assist for bed level mobility and is dependent in all ADL. Supportive daughter at bedside and informing therapists that pt was independent, but not driving prior to admission and lives alone. Pt will need SNF level rehab upon discharge. Daughter plans for pt to come live with her eventually.      Recommendations for follow up therapy are one component of a multi-disciplinary discharge planning process, led by the attending physician.  Recommendations may be updated based on patient status, additional functional criteria and insurance authorization.   Follow Up Recommendations  Skilled nursing-short term rehab (<3 hours/day)    Assistance Recommended at Discharge Frequent or constant Supervision/Assistance  Functional Status Assessment  Patient has had a recent decline in their functional status and/or demonstrates limited ability to make significant improvements in function in a reasonable and predictable amount of time  Equipment Recommendations  Wheelchair (measurements OT);Wheelchair cushion (measurements OT);BSC/3in1;Hospital bed    Recommendations for Other Services       Precautions / Restrictions Precautions Precautions: Fall Restrictions Weight Bearing Restrictions: No      Mobility Bed Mobility Overal bed mobility: Needs Assistance Bed Mobility: Rolling Rolling:  Total assist         General bed mobility comments: total A to roll for repositioning and to offweight R hip. Also placed towel rolls under ankles to offweight heels.    Transfers                   General transfer comment: Unable to tolerate      Balance                                           ADL either performed or assessed with clinical judgement   ADL                                         General ADL Comments: currently requiring total assist     Vision Patient Visual Report: No change from baseline       Perception     Praxis      Pertinent Vitals/Pain Pain Assessment: Faces Faces Pain Scale: Hurts whole lot Pain Location: LUE, LLE Pain Descriptors / Indicators: Grimacing;Guarding;Moaning Pain Intervention(s): Monitored during session;Repositioned     Hand Dominance Right   Extremity/Trunk Assessment Upper Extremity Assessment Upper Extremity Assessment: RUE deficits/detail;LUE deficits/detail RUE Deficits / Details: generally weak, indicates pain with AAROM shoulder at end range RUE Coordination: decreased gross motor LUE Deficits / Details: no voluntary movement noted, pain with PROM of shoulder LUE: Shoulder pain with ROM LUE Coordination: decreased gross motor;decreased fine motor   Lower Extremity Assessment Lower Extremity Assessment: Defer to PT evaluation LLE Deficits / Details: No AROM noted in LLE. Pt reporting increased  pain. Foot cold and red       Communication Communication Communication: Expressive difficulties   Cognition Arousal/Alertness: Awake/alert Behavior During Therapy: Anxious Overall Cognitive Status: Impaired/Different from baseline Area of Impairment: Attention;Memory;Following commands;Safety/judgement;Awareness;Problem solving;Orientation                 Orientation Level: Situation Current Attention Level: Focused Memory: Decreased short-term memory;Decreased  recall of precautions Following Commands: Follows one step commands with increased time Safety/Judgement: Decreased awareness of deficits;Decreased awareness of safety Awareness: Intellectual Problem Solving: Slow processing;Difficulty sequencing;Decreased initiation;Requires verbal cues;Requires tactile cues General Comments: Pt moaning in pain throughout. Unable to maintain attention to task.     General Comments  Wounds noted on L arm, L leg and R foot    Exercises     Shoulder Instructions      Home Living Family/patient expects to be discharged to:: Private residence Living Arrangements: Alone Available Help at Discharge: Family Type of Home: House Home Access: Level entry     Home Layout: One level     Bathroom Shower/Tub: Teacher, early years/pre: Standard     Home Equipment: Hand held shower head   Additional Comments: daughter's home information, plans to stay with daughter      Prior Functioning/Environment Prior Level of Function : Independent/Modified Independent                        OT Problem List: Decreased strength;Decreased activity tolerance;Impaired balance (sitting and/or standing);Decreased range of motion;Decreased coordination;Decreased cognition;Pain;Impaired UE functional use;Decreased knowledge of use of DME or AE      OT Treatment/Interventions: Self-care/ADL training;DME and/or AE instruction;Therapeutic exercise;Therapeutic activities;Cognitive remediation/compensation;Patient/family education;Balance training    OT Goals(Current goals can be found in the care plan section) Acute Rehab OT Goals OT Goal Formulation: With patient/family Time For Goal Achievement: 04/28/21 Potential to Achieve Goals: Fair ADL Goals Pt Will Perform Eating: with min assist;sitting Pt Will Perform Grooming: with min assist;sitting Pt Will Perform Upper Body Dressing: with mod assist;sitting Pt Will Transfer to Toilet: with +2 assist;with  mod assist;bedside commode Pt/caregiver will Perform Home Exercise Program: Increased ROM;Increased strength;Left upper extremity;With minimal assist Additional ADL Goal #1: Pt will follow one step commands with 50% accuracy. Additional ADL Goal #2: Pt will demonstrate sustained attention in preparation for ADL.  OT Frequency: Min 2X/week   Barriers to D/C:            Co-evaluation PT/OT/SLP Co-Evaluation/Treatment: Yes Reason for Co-Treatment: For patient/therapist safety PT goals addressed during session: Mobility/safety with mobility;Balance OT goals addressed during session: Strengthening/ROM      AM-PAC OT "6 Clicks" Daily Activity     Outcome Measure Help from another person eating meals?: Total Help from another person taking care of personal grooming?: Total Help from another person toileting, which includes using toliet, bedpan, or urinal?: Total Help from another person bathing (including washing, rinsing, drying)?: Total Help from another person to put on and taking off regular upper body clothing?: Total Help from another person to put on and taking off regular lower body clothing?: Total 6 Click Score: 6   End of Session Nurse Communication: Mobility status (needs PRAFOs)  Activity Tolerance: Patient limited by fatigue;Patient limited by pain Patient left: in bed;with call bell/phone within reach;with family/visitor present  OT Visit Diagnosis: Pain;Muscle weakness (generalized) (M62.81);Other symptoms and signs involving cognitive function                Time: 9509-3267 OT Time  Calculation (min): 18 min Charges:  OT General Charges $OT Visit: 1 Visit OT Evaluation $OT Eval Moderate Complexity: Hayden, OTR/L Acute Rehabilitation Services Pager: [phone] Office: [phone]   Malka So 04/14/2021, 1:29 PM

## 2021-04-14 NOTE — Evaluation (Signed)
Physical Therapy Evaluation Patient Details Name: Lori [name] MRN: [id number] DOB: 1955/11/16 Today's Date: 04/14/2021  History of Present Illness  Pt is a 65 y/o female admitted 12/7 after being found down. Pt with Rhabdomyolysis,  AKI, metabolic acidosis, mild hyperkalemia, transaminitis. PMH includes tobacco use, HTN, cocaine abuse, cervical cancer, and gout.  Clinical Impression  Pt admitted secondary to problem above with deficits below. No AROM noted in LUE/LLE during session. Pt requiring total A to roll for repositioning. Pt with increased pain and unable to tolerate further mobility. Very anxious throughout and unable to answer PT questions. Pt was previously independent with mobility tasks and lived alone. Recommending SNF level therapies at d/c to increase independence and safety. Will continue to follow acutely.      Recommendations for follow up therapy are one component of a multi-disciplinary discharge planning process, led by the attending physician.  Recommendations may be updated based on patient status, additional functional criteria and insurance authorization.  Follow Up Recommendations Skilled nursing-short term rehab (<3 hours/day)    Assistance Recommended at Discharge Frequent or constant Supervision/Assistance  Functional Status Assessment Patient has had a recent decline in their functional status and demonstrates the ability to make significant improvements in function in a reasonable and predictable amount of time.  Equipment Recommendations  Wheelchair cushion (measurements PT);Wheelchair (measurements PT);Hospital bed;Other (comment) (hoyer lift with pad)    Recommendations for Other Services       Precautions / Restrictions Precautions Precautions: Fall Restrictions Weight Bearing Restrictions: No      Mobility  Bed Mobility Overal bed mobility: Needs Assistance Bed Mobility: Rolling Rolling: Total assist         General bed mobility  comments: total A to roll for repositioning and to offweight R hip. Also placed towel rolls under ankles to offweight heels.    Transfers                   General transfer comment: Unable to tolerate    Ambulation/Gait                  Stairs            Wheelchair Mobility    Modified Rankin (Stroke Patients Only)       Balance                                             Pertinent Vitals/Pain Pain Assessment: Faces Faces Pain Scale: Hurts whole lot Pain Location: LUE, LLE Pain Descriptors / Indicators: Grimacing;Guarding;Moaning Pain Intervention(s): Limited activity within patient's tolerance;Monitored during session;Repositioned    Home Living Family/patient expects to be discharged to:: Private residence Living Arrangements: Alone Available Help at Discharge: Family Type of Home: House Home Access: Level entry       Home Layout: One level Home Equipment: Hand held shower head Additional Comments: Plans to stay with daughter    Prior Function Prior Level of Function : Independent/Modified Independent                     Hand Dominance   Dominant Hand: Right    Extremity/Trunk Assessment   Upper Extremity Assessment Upper Extremity Assessment: Defer to OT evaluation    Lower Extremity Assessment Lower Extremity Assessment: LLE deficits/detail LLE Deficits / Details: No AROM noted in LLE. Pt reporting increased pain. Foot cold and red  Communication   Communication: Expressive difficulties  Cognition Arousal/Alertness: Awake/alert Behavior During Therapy: Anxious Overall Cognitive Status: Impaired/Different from baseline Area of Impairment: Attention;Memory;Following commands;Safety/judgement;Awareness;Problem solving                   Current Attention Level: Focused Memory: Decreased short-term memory;Decreased recall of precautions Following Commands: Follows one step commands with  increased time Safety/Judgement: Decreased awareness of deficits;Decreased awareness of safety Awareness: Intellectual Problem Solving: Slow processing;Difficulty sequencing;Decreased initiation;Requires verbal cues;Requires tactile cues General Comments: Pt moaning in pain throughout. Unable to maintain attention to task.        General Comments General comments (skin integrity, edema, etc.): Wounds noted on L arm, L leg and R foot    Exercises     Assessment/Plan    PT Assessment Patient needs continued PT services  PT Problem List Decreased strength;Decreased activity tolerance;Decreased balance;Decreased mobility;Decreased knowledge of use of DME;Decreased knowledge of precautions;Decreased cognition;Decreased coordination;Decreased safety awareness;Pain;Decreased range of motion;Decreased skin integrity;Impaired sensation       PT Treatment Interventions DME instruction;Gait training;Functional mobility training;Therapeutic exercise;Therapeutic activities;Balance training;Patient/family education    PT Goals (Current goals can be found in the Care Plan section)  Acute Rehab PT Goals Patient Stated Goal: for pt to get better per daughter PT Goal Formulation: With family Time For Goal Achievement: 04/28/21 Potential to Achieve Goals: Good    Frequency Min 2X/week   Barriers to discharge        Co-evaluation PT/OT/SLP Co-Evaluation/Treatment: Yes Reason for Co-Treatment: For patient/therapist safety;To address functional/ADL transfers PT goals addressed during session: Mobility/safety with mobility;Balance         AM-PAC PT "6 Clicks" Mobility  Outcome Measure Help needed turning from your back to your side while in a flat bed without using bedrails?: Total Help needed moving from lying on your back to sitting on the side of a flat bed without using bedrails?: Total Help needed moving to and from a bed to a chair (including a wheelchair)?: Total Help needed standing  up from a chair using your arms (e.g., wheelchair or bedside chair)?: Total Help needed to walk in hospital room?: Total Help needed climbing 3-5 steps with a railing? : Total 6 Click Score: 6    End of Session   Activity Tolerance: Patient limited by pain Patient left: in bed;with call bell/phone within reach;with family/visitor present (on stretcher in ED) Nurse Communication: Mobility status;Other (comment) (requested prevalon boots) PT Visit Diagnosis: Unsteadiness on feet (R26.81);Muscle weakness (generalized) (M62.81);Difficulty in walking, not elsewhere classified (R26.2)    Time: 6644-0347 PT Time Calculation (min) (ACUTE ONLY): 20 min   Charges:   PT Evaluation $PT Eval Moderate Complexity: 1 Mod          Reuel Derby, PT, DPT  Acute Rehabilitation Services  Pager: [phone] Office: [phone]   Rudean Hitt 04/14/2021, 12:45 PM

## 2021-04-14 NOTE — Consult Note (Addendum)
Stroke Neurology Consultation Note  Consult Requested by: Dr. Johnney Killian   Reason for Consult: left sided weakness  Consult Date: 04/14/21   The history was obtained from the chart.  During history and examination, all items were not able to obtain unless otherwise noted.  History of Present Illness:  Lori [name] is a 65 y.o. Caucasian female with PMH of depression, cocaine abuse, hypertension, smoker presented to ER for fell at home not able to get up for more than 24 hours.  Patient currently mildly agitated, screaming or pain in legs, not able to provide adequate information.  Per chart, patient presented to ED 2 days ago for lower back pain and bilateral leg pain and difficulty walking for 2-3 months.  DVT and lumbar x-ray negative, patient left AMA.  Per report, yesterday she developed abdominal pain, diarrhea, fell at home but not able to get up from the floor.  She stayed on the floor for more than 24 hours before sending to ED for evaluation.  In the ED, she was found to have severe hyponatremia, mild hyperkalemia, severe AKI, elevated CK and liver enzymes and lactic acid as well as leukocytosis.  CCM consulted.  Patient was also found to have left arm and leg weakness, no facial droop, and significant for left arm and leg pain.  Neurology was consulted.  She had severe abrasion versus burn injury at right lateral foot and left thigh.  CT head no acute abnormality.  MRI pending.  LSN: 2 days ago tPA Given: No: Outside window  Past Medical History:  Diagnosis Date   Arthritis    Depression    Depression with anxiety 07-23-08   Patient had multiple death in her family over a short period of time. father, brother, uncle and niece who was stabbed 3 years ago. Patient's husband had a car accident last year and  need currently a feeding tube.    GERD (gastroesophageal reflux disease)    Gout    Headache(784.0)    migraines   History of cervical cancer  1985    status post partial  hysterectomy , last Pap smear 10 years ago , no further followup   History of cocaine abuse ([place]) 10/23/2008    last documented in July 23, 2005 when admitted for hypertensive urgency   Hypertension 2005-07-23   Admitted for HTN crisis in 10/2008. Was given in 23-Jul-2008 a wrong prescription from the pharmacy and per ED note it was Lisinopril-Hydrocholorthizide which gave her a  hives and feeling of sickness. Patient was started  on this meds on 05/31/2010 by Dr  Ihor Gully without any problem.    Insomnia    Tobacco abuse     35 years   Uterine cancer [facility])     Past Surgical History:  Procedure Laterality Date   BIOPSY  02/28/2020   Procedure: BIOPSY;  Surgeon: Doran Stabler, MD;  Location: [facility] ENDOSCOPY;  Service: Gastroenterology;;   [place]   COLONOSCOPY WITH PROPOFOL N/A 03/01/2017   Procedure: COLONOSCOPY WITH PROPOFOL;  Surgeon: Milus Banister, MD;  Location: [facility] ENDOSCOPY;  Service: Endoscopy;  Laterality: N/A;   ESOPHAGOGASTRODUODENOSCOPY (EGD) WITH PROPOFOL N/A 03/01/2017   Procedure: ESOPHAGOGASTRODUODENOSCOPY (EGD) WITH PROPOFOL;  Surgeon: Milus Banister, MD;  Location: [facility] ENDOSCOPY;  Service: Endoscopy;  Laterality: N/A;   ESOPHAGOGASTRODUODENOSCOPY (EGD) WITH PROPOFOL N/A 02/28/2020   Procedure: ESOPHAGOGASTRODUODENOSCOPY (EGD) WITH PROPOFOL;  Surgeon: Doran Stabler, MD;  Location: [place];  Service: Gastroenterology;  Laterality: N/A;  PARTIAL HYSTERECTOMY     SKIN GRAFT      Family History  Problem Relation Age of Onset   Thyroid disease Mother    Diabetes Father    Heart disease Father    Hyperlipidemia Father    Hypertension Father    Diabetes Brother    Hypertension Brother    Hyperlipidemia Brother    Colon polyps Brother    Thyroid disease Daughter    Obesity Daughter    Mental illness Daughter    Colon cancer Maternal Uncle    Stomach cancer Neg Hx    Pancreatic cancer Neg Hx     Social History:  reports that she has been smoking  cigarettes. She has a 21.50 pack-year smoking history. She has never used smokeless tobacco. She reports that she does not drink alcohol and does not use drugs.  Allergies: No Known Allergies  No current facility-administered medications on file prior to encounter.   Current Outpatient Medications on File Prior to Encounter  Medication Sig Dispense Refill   acetaminophen (TYLENOL) 325 MG tablet Take 2 tablets (650 mg total) by mouth every 6 (six) hours as needed. 30 tablet 0   acetaminophen (TYLENOL) 500 MG tablet Take 500-1,000 mg by mouth every 8 (eight) hours as needed (for headaches).     albuterol (VENTOLIN HFA) 108 (90 Base) MCG/ACT inhaler Inhale 1-2 puffs into the lungs every 6 (six) hours as needed for wheezing or shortness of breath. 8 g 0   ALPRAZolam (XANAX) 0.5 MG tablet Take 0.5-1 mg by mouth See admin instructions. Take 0.5-1 mg by mouth at bedtime and an additional 0.5 mg up to two times a day as needed for anxiety     aspirin EC 81 MG tablet Take 1 tablet (81 mg total) by mouth daily. Swallow whole. 90 tablet 3   atorvastatin (LIPITOR) 40 MG tablet Take 1 tablet (40 mg total) by mouth daily. 90 tablet 3   cyclobenzaprine (FLEXERIL) 10 MG tablet Take 10 mg by mouth 3 (three) times daily.     gabapentin (NEURONTIN) 300 MG capsule TAKE 1 CAPSULE BY MOUTH EVERYDAY AT BEDTIME 90 capsule 0   gabapentin (NEURONTIN) 400 MG capsule Take 400 mg by mouth 4 (four) times daily.     lisinopril-hydrochlorothiazide (ZESTORETIC) 20-12.5 MG tablet Take 1 tablet by mouth at bedtime.     pantoprazole (PROTONIX) 40 MG tablet Take 1 tablet (40 mg total) by mouth 2 (two) times daily. 60 tablet 6   QUEtiapine (SEROQUEL) 100 MG tablet Take 100 mg by mouth in the morning and at bedtime.     zolpidem (AMBIEN) 5 MG tablet Take 5 mg by mouth at bedtime.      Review of Systems: A full ROS was attempted today and was not able to be performed due to distraction, AMS.   Physical Examination: Temp:  [98.7  F (37.1 C)] 98.7 F (37.1 C) (12/07 2015) Pulse Rate:  [99-112] 102 (12/07 2345) Resp:  [13-30] 16 (12/07 2345) BP: (92-121)/(62-93) 118/84 (12/07 2345) SpO2:  [100 %] 100 % (12/07 2345)  General - well nourished, well developed, in acute distress of body pain.    Ophthalmologic - fundi not visualized due to noncooperation.    Cardiovascular - regular rhythm and rate  Neuro - awake, alert, mildly agitated, acute distress of body pain, eyes open, orientated to age, place, time. No aphasia, following all simple commands, however easily distractible and decreased concentration and attention.  Able to repeat, naming 2/3. No  gaze palsy, tracking bilaterally, visual field full, PERRL. No facial droop. Tongue midline.  Right upper and right lower extremity at least 4/5.  However left upper extremity 2/5 proximal and 3/5 bicep, finger grip 2/5.  Left lower extremity not against gravity.  Sensation not cooperative due to acute pain, right FTN intact grossly, gait not tested.    Data Reviewed: CT ABDOMEN PELVIS WO CONTRAST  Result Date: 04/14/2021 CLINICAL DATA:  Fall 2 days ago with abdominal pain, initial encounter EXAM: CT ABDOMEN AND PELVIS WITHOUT CONTRAST TECHNIQUE: Multidetector CT imaging of the abdomen and pelvis was performed following the standard protocol without IV contrast. COMPARISON:  02/29/2020 FINDINGS: Lower chest: No acute abnormality. Hepatobiliary: No focal liver abnormality is seen. No gallstones, gallbladder wall thickening, or biliary dilatation. Pancreas: Unremarkable. No pancreatic ductal dilatation or surrounding inflammatory changes. Spleen: Normal in size without focal abnormality. Adrenals/Urinary Tract: Adrenal glands are within normal limits. Kidneys demonstrate no renal calculi or urinary tract obstructive changes. A few cysts are noted within left kidney stable in appearance from the prior exam. The bladder is well distended. No ureteral stones are seen. Stomach/Bowel:  Mild fecal material is noted within the colon consistent with a degree of constipation. No obstructive or inflammatory changes are noted. The appendix is within normal limits. Small bowel and stomach are unremarkable. Vascular/Lymphatic: Aortic atherosclerosis. No enlarged abdominal or pelvic lymph nodes. Reproductive: Status post hysterectomy. No adnexal masses. Other: No abdominal wall hernia or abnormality. No abdominopelvic ascites. Musculoskeletal: Mild degenerative changes of lumbar spine are noted. IMPRESSION: Mild constipation. No findings to correspond with the given clinical history. Electronically Signed   By: Inez Catalina M.D.   On: 04/24/2021 23:03   DG Lumbar Spine Complete  Result Date: 04/11/2021 CLINICAL DATA:  Severe pain going into left leg, worse over the last 2 months. Chronic back pain. EXAM: LUMBAR SPINE - COMPLETE 4+ VIEW COMPARISON:  Lumbar spine radiographs 11/18/2019. CT 04/12/2020. FINDINGS: There are 5 lumbar type vertebral bodies demonstrating a minimal convex right scoliosis centered at L1. At L4-5, there is a degenerative grade 1 anterolisthesis. Chronic disc space narrowing and endplate osteophytes at L5-S1. There are mild facet degenerative changes inferiorly. No evidence of acute fracture or pars defect. Aortoiliac atherosclerosis noted. IMPRESSION: Stable lumbar spondylosis and degenerative grade 1 anterolisthesis at L4-5. No acute osseous findings. Electronically Signed   By: Richardean Sale M.D.   On: 04/11/2021 15:48   CT Head Wo Contrast  Result Date: 04/14/2021 CLINICAL DATA:  Recent fall yesterday with headaches and neck pain, initial EN EXAM: CT HEAD WITHOUT CONTRAST CT CERVICAL SPINE WITHOUT CONTRAST TECHNIQUE: Multidetector CT imaging of the head and cervical spine was performed following the standard protocol without intravenous contrast. Multiplanar CT image reconstructions of the cervical spine were also generated. COMPARISON:  02/03/2021 FINDINGS: CT HEAD  FINDINGS Brain: No evidence of acute infarction, hemorrhage, hydrocephalus, extra-axial collection or mass lesion/mass effect. 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 Vascular: No hyperdense vessel or unexpected calcification. Skull: Normal. Negative for fracture or focal lesion. Sinuses/Orbits: Mild mucosal changes are noted within the right sphenoid sinus. Other: None. CT CERVICAL SPINE FINDINGS Alignment: Within normal limits. Skull base and vertebrae: None cervical segments are well visualized. Disc space narrowing is noted at C5-6 and C6-7 with associated osteophytic changes. Mild facet hypertrophic changes are noted. No acute fracture or acute facet abnormality is seen. Soft tissues and spinal canal: Surrounding soft tissue structures are within normal  limits. Vascular calcifications are seen. Upper chest: Visualized lung apices are unremarkable. Other: None IMPRESSION: CT of the head: Mild mucosal changes in the right sphenoid sinus. No acute intracranial abnormality noted. CT of the cervical spine: Multilevel degenerative change without acute abnormality. Electronically Signed   By: Inez Catalina M.D.   On: 04/12/2021 23:00   CT CERVICAL SPINE WO CONTRAST  Result Date: 04/20/2021 CLINICAL DATA:  Recent fall yesterday with headaches and neck pain, initial EN EXAM: CT HEAD WITHOUT CONTRAST CT CERVICAL SPINE WITHOUT CONTRAST TECHNIQUE: Multidetector CT imaging of the head and cervical spine was performed following the standard protocol without intravenous contrast. Multiplanar CT image reconstructions of the cervical spine were also generated. COMPARISON:  02/03/2021 FINDINGS: CT HEAD FINDINGS Brain: No evidence of acute infarction, hemorrhage, hydrocephalus, extra-axial collection or mass lesion/mass effect. 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 Vascular: No hyperdense vessel or unexpected calcification. Skull: Normal.  Negative for fracture or focal lesion. Sinuses/Orbits: Mild mucosal changes are noted within the right sphenoid sinus. Other: None. CT CERVICAL SPINE FINDINGS Alignment: Within normal limits. Skull base and vertebrae: None cervical segments are well visualized. Disc space narrowing is noted at C5-6 and C6-7 with associated osteophytic changes. Mild facet hypertrophic changes are noted. No acute fracture or acute facet abnormality is seen. Soft tissues and spinal canal: Surrounding soft tissue structures are within normal limits. Vascular calcifications are seen. Upper chest: Visualized lung apices are unremarkable. Other: None IMPRESSION: CT of the head: Mild mucosal changes in the right sphenoid sinus. No acute intracranial abnormality noted. CT of the cervical spine: Multilevel degenerative change without acute abnormality. Electronically Signed   By: Inez Catalina M.D.   On: 04/24/2021 23:00   DG Chest Port 1 View  Result Date: 04/26/2021 CLINICAL DATA:  Abdominal pain.  Unwitnessed fall. EXAM: PORTABLE CHEST 1 VIEW COMPARISON:  10/29/2020. FINDINGS: The heart size and mediastinal contours are within normal limits. Mild atherosclerotic calcification of the aorta is noted. Both lungs are clear. No acute osseous abnormality. IMPRESSION: No acute cardiopulmonary process. Electronically Signed   By: Brett Fairy M.D.   On: 04/12/2021 21:45   VAS [place] LOWER EXTREMITY VENOUS (DVT) (7a-7p)  Result Date: 04/11/2021  Lower Venous DVT Study Patient Name:  TAURA LAMARRE  Date of Exam:   04/11/2021 Medical Rec #: [id number]             Accession #:    [id number] Date of Birth: Sep 26, 1955              Patient Gender: F Patient Age:   50 years Exam Location:  [facility] Procedure:      VAS [place] LOWER EXTREMITY VENOUS (DVT) Referring Phys: Coral Ceo --------------------------------------------------------------------------------  Performing Technologist: Archie Patten RVS  Examination Guidelines: A  complete evaluation includes B-mode imaging, spectral Doppler, color Doppler, and power Doppler as needed of all accessible portions of each vessel. Bilateral testing is considered an integral part of a complete examination. Limited examinations for reoccurring indications may be performed as noted. The reflux portion of the exam is performed with the patient in reverse Trendelenburg.  +-----+---------------+---------+-----------+----------+--------------+ RIGHTCompressibilityPhasicitySpontaneityPropertiesThrombus Aging +-----+---------------+---------+-----------+----------+--------------+ CFV  Full           Yes      Yes                                 +-----+---------------+---------+-----------+----------+--------------+   +---------+---------------+---------+-----------+----------+--------------+  LEFT     CompressibilityPhasicitySpontaneityPropertiesThrombus Aging +---------+---------------+---------+-----------+----------+--------------+ CFV      Full           Yes      Yes                                 +---------+---------------+---------+-----------+----------+--------------+ SFJ      Full                                                        +---------+---------------+---------+-----------+----------+--------------+ FV Prox  Full                                                        +---------+---------------+---------+-----------+----------+--------------+ FV Mid   Full                                                        +---------+---------------+---------+-----------+----------+--------------+ FV DistalFull                                                        +---------+---------------+---------+-----------+----------+--------------+ PFV      Full                                                        +---------+---------------+---------+-----------+----------+--------------+ POP      Full           Yes      Yes                                  +---------+---------------+---------+-----------+----------+--------------+ PTV      Full                                                        +---------+---------------+---------+-----------+----------+--------------+ PERO     Full                                                        +---------+---------------+---------+-----------+----------+--------------+     Summary: RIGHT: - No evidence of common femoral vein obstruction.  LEFT: - There is no evidence of deep vein thrombosis in the lower extremity.  - No cystic structure found in the popliteal fossa.  *See table(s) above for measurements and observations.  Electronically signed by Monica Martinez MD on 04/11/2021 at 5:13:08 PM.    Final     Assessment: 65 y.o. female with PMH of depression, cocaine abuse, hypertension, smoker presented to ER for abdominal pain, diarrhea, fell at home not able to get up for more than 24 hours.  Labs showed severe metabolic derangement, CCM consulted. Patient was also found to have left arm and leg weakness, no facial droop, and significant for left arm and leg pain.  Neurology was consulted.   CT head no acute abnormality.  MRI brain, C-spine and MRA head and neck pending.  Noted to have CTA head and neck due to severe AKI.  Patient not a tPA candidate given outside window.  Etiology for left-sided weakness no clear, concerning for stroke but no facial involvement versus cervical spinal cord compression but patient denies any neck pain.  We will do MRI brain, MRI C-spine, MRA head and neck.  Continue stroke work-up with 2D echo, LDL, A1c.  Okay to continue home aspirin and Lipitor.  Further regimen will be based on above work-up.   Plan: Continue further stroke work up  Frequent neuro checks Telemetry monitoring MRI brain and C-spine MRA head with carotid doppler Echocardiogram  UDS, fasting lipid panel and HgbA1C PT/OT/speech consult Continue home aspirin and Lipitor for now.   Future regimen will be based on stroke work-up. Stroke risk factor modification, including quit smoking Metabolic derangement management per primary team and EDP Discussed with Dr. Johnney Killian ED physician Stroke team will follow in a.m.  Thank you for this consultation and allowing [place] to participate in the care of this patient.   Rosalin Hawking, MD PhD Stroke Neurology 04/14/2021 1:43 AM

## 2021-04-14 NOTE — ED Notes (Signed)
Bipap off per order Dr. Candiss Norse, [place] @3liters  patient had a very large soft BM cleaned patient , also had a lot of dried. Feces cleaned patient.

## 2021-04-14 NOTE — ED Notes (Signed)
Family at bedside. Update given.

## 2021-04-14 NOTE — ED Notes (Addendum)
SAts remain in low 80's, placed on NRB,

## 2021-04-14 NOTE — Progress Notes (Signed)
Received consult for IV. Pt currently has 4 IV's documented and an [place] with pigtail. Securechat sent to RN. Included information regarding protocol to draw labs from [facility] pigtail. Consult cleared.

## 2021-04-14 NOTE — TOC Initial Note (Signed)
Transition of Care [facility]) - Initial/Assessment Note    Patient Details  Name: Lori [name] MRN: [id number] Date of Birth: 06-11-1955  Transition of Care [facility]) CM/SW Contact:    Verdell Carmine, RN Phone Number: 04/14/2021, 5:08 PM  Clinical Narrative:                 65 year old patient arrived from home due to fall , confusion.metabolic encephalopathy Patient was positive for cocaine, benzo, and opiates. Consult placed for possible SNF. It is unlikely the patient will be able to be accepted for SNF or HH due to patients primary insurance is Medicaid.. PT and OT pending evaluation.  CM CSW will follow for needs, recommendations, and transitions.    Expected Discharge Plan: [place] Barriers to Discharge: Inadequate or no insurance   Patient Goals and CMS Choice        Expected Discharge Plan and Services Expected Discharge Plan: [place]   Discharge Planning Services: CM Consult                                          Prior Living Arrangements/Services     Patient language and need for interpreter reviewed:: Yes        Need for Family Participation in Patient Care: Yes (Comment) Care giver support system in place?: Yes (comment)   Criminal Activity/Legal Involvement Pertinent to Current Situation/Hospitalization: No - Comment as needed  Activities of Daily Living      Permission Sought/Granted                  Emotional Assessment       Orientation: : Fluctuating Orientation (Suspected and/or reported Sundowners) Alcohol / Substance Use: Illicit Drugs (positive for opiates benzodiazipines and cocaine) Psych Involvement: No (comment)  Admission diagnosis:  Rhabdomyolysis [M62.82] Patient Active Problem List   Diagnosis Date Noted   Rhabdomyolysis 04/14/2021   Elevated LFTs 04/14/2021   Left-sided weakness 04/14/2021   Elevated troponin 04/14/2021   Wounds, multiple 04/14/2021   Hyperkalemia  04/14/2021   Carotid artery stenosis, asymptomatic, bilateral 02/10/2021   Coronary artery calcification 02/10/2021   Aortic atherosclerosis ([place]) 02/10/2021   Subclavian artery stenosis, left ([place]) 02/10/2021   Hematemesis with nausea 02/27/2020   Epigastric pain 02/27/2020   AKI (acute kidney injury) ([place])    Hx of adenomatous colonic polyps    Benign neoplasm of transverse colon    Bloating    Gastritis and gastroduodenitis    Bilateral foot pain 01/15/2017   Achilles tendon contracture, bilateral 01/15/2017   Normocytic anemia 09/12/2016   Hyponatremia 09/12/2016   Acute pancreatitis 09/11/2016   Shin splints, sequela 08/16/2016   Cellulitis of groin 01/05/2016   Encounter for screening mammogram for breast cancer 07/02/2015   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   Allergic rhinitis 11/27/2014   Otitis media of right ear 07/01/2012   Renal lesion 06/12/2011   Nausea 05/10/2011   [place] 04/12/2011   Abdominal pain 10/25/2010   Preventive measure 10/25/2010   Shoulder pain, left 10/25/2010   Leg pain 06/20/2010   CERVICAL CANCER 10/23/2008   TOBACCO ABUSE 10/23/2008   Depression with anxiety 10/23/2008   Essential hypertension 10/23/2008   GERD 10/23/2008   Insomnia 10/23/2008   SNORING 10/23/2008   History of cervical cancer 1985   PCP:  Alvester Chou, NP Pharmacy:  [place], [place] [address] Phone: [phone] Fax: [place], [place] - [address] AT [facility] [place] [address] Phone: [phone] Fax: [phone]     Social Determinants of Health (SDOH) Interventions    Readmission Risk Interventions No flowsheet data found.

## 2021-04-14 NOTE — Consult Note (Signed)
   Patient Status: [name] - ED  Assessment and Plan: Patient in need of venous access; may require hemodialysis. Central line requested.   Telephone consent obtained from the patient's sister Lori [name].   Risks and benefits discussed with the patient including, but not limited to bleeding, infection, vascular injury, pneumothorax which may require chest tube placement, air embolism or even death  All of the patient's questions were answered, patient is agreeable to proceed.  Consent signed and in IR  ______________________________________________________________________   History of Present Illness: Lori [name] is a 65 y.o. 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 She was brought to the ED after a fall at home and spent approximately 24 hours on the floor. She had complaints of abdominal pain, diarrhea and shortness of breath; lab work up was significant for severe hyponatremia and AKI secondary to rhabdomyolysis.   She has poor venous access and may also require dialysis during this hospital admission. Interventional Radiology has been asked to evaluate this patient for an image-guided temporary central venous catheter.   Allergies and medications reviewed.   Review of Systems: A 12 point ROS discussed and pertinent positives are indicated in the HPI above.  All other systems are negative.  Review of Systems  Unable to perform ROS: Mental status change   Vital Signs: BP 134/74   Pulse (!) 123   Temp (!) 97.5 F (36.4 C) (Oral)   Resp 16   SpO2 100%   Physical Exam Constitutional:      Appearance: She is ill-appearing.  Pulmonary:     Effort: Pulmonary effort is normal.  Neurological:     Mental Status: She is alert.     Comments: Moderate tremors; patient non-verbal, minimal eye contact.      Imaging reviewed.   Labs:  COAGS: Recent  Labs    04/14/21 0140  INR 1.6*    BMP: Recent Labs    04/10/2021 2130 04/11/2021 2201 04/14/21 0251 04/14/21 0810 04/14/21 0824 04/14/21 1200  NA 121* 120*  119* 126* 127* 126* 129*  K 5.8* 5.6*  5.6* 6.0* 5.4* 5.2* 6.5*  CL 87* 92* 96* 96*  --  95*  CO2 14*  --  21* 16*  --  17*  GLUCOSE 191* 183* 158* 190*  --  127*  BUN 41* 46* 42* 47*  --  49*  CALCIUM 8.9  --  7.2* 9.9  --  8.0*  CREATININE 3.42* 3.40* 2.96* 3.35*  --  3.45*  GFRNONAA 14*  --  17* 15*  --  14*       Electronically Signed: Theresa Duty, NP 04/14/2021, 4:25 PM   I spent a total of 15 minutes in face to face in clinical consultation, greater than 50% of which was counseling/coordinating care for venous access.

## 2021-04-14 NOTE — Progress Notes (Signed)
Placed pt. On bipap per MD. 

## 2021-04-14 NOTE — Progress Notes (Addendum)
STROKE TEAM PROGRESS NOTE   INTERVAL HISTORY Her daughter is at the bedside.  Patient is lying on stretcher, resting in NAD. She arouses easily. She follows commands, confused and has asterixis . She is complaining of left arm and left leg pain and both extremities are swollen and taut, with multiple abrasions and bruises on extremities. UDS + cocaine, benzos and opiates. Brain MRI/MRA imaging were nondiagnostic due to metal artifact in face. Plan is to repeat CTH in 24 hrs scheduled for 2300 tonight  Vitals:   04/14/21 0709 04/14/21 0900 04/14/21 1130 04/14/21 1200  BP:  (!) 141/66 (!) 142/124 115/87  Pulse: (!) 119  (!) 121 (!) 124  Resp: (!) 56  16   Temp:      TempSrc:      SpO2: 98%  100% 95%   CBC:  Recent Labs  Lab 04/30/2021 2130 04/08/2021 2201 04/14/21 0251 04/14/21 0824  WBC 19.4*  --  9.6  --   NEUTROABS 17.4*  --  6.5  --   HGB 12.0   < > 11.4* 11.9*  HCT 35.9*   < > 34.0* 35.0*  MCV 94.2  --  92.4  --   PLT 674*  --  565*  --    < > = values in this interval not displayed.   Basic Metabolic Panel:  Recent Labs  Lab 04/14/21 0251 04/14/21 0551 04/14/21 0810 04/14/21 0824 04/14/21 1200  NA 126*  --  127* 126* 129*  K 6.0*  --  5.4* 5.2* 6.5*  CL 96*  --  96*  --  95*  CO2 21*  --  16*  --  17*  GLUCOSE 158*  --  190*  --  127*  BUN 42*  --  47*  --  49*  CREATININE 2.96*  --  3.35*  --  3.45*  CALCIUM 7.2*  --  9.9  --  8.0*  MG 2.1 2.0  --   --   --   PHOS 6.4*  --  6.5*  --  6.8*   Lipid Panel:  Recent Labs  Lab 04/14/21 0551  CHOL 108  TRIG 87  HDL 34*  CHOLHDL 3.2  VLDL 17  LDLCALC 57   HgbA1c:  Recent Labs  Lab 04/14/21 0810  HGBA1C 6.4*   Urine Drug Screen:  Recent Labs  Lab 04/14/21 1010  LABOPIA POSITIVE*  COCAINSCRNUR POSITIVE*  LABBENZ POSITIVE*  AMPHETMU NONE DETECTED  THCU NONE DETECTED  LABBARB NONE DETECTED    Alcohol Level  Recent Labs  Lab 04/14/21 0140  ETH <10    IMAGING past 24 hours CT ABDOMEN PELVIS WO  CONTRAST  Result Date: 04/27/2021 CLINICAL DATA:  Fall 2 days ago with abdominal pain, initial encounter EXAM: CT ABDOMEN AND PELVIS WITHOUT CONTRAST TECHNIQUE: Multidetector CT imaging of the abdomen and pelvis was performed following the standard protocol without IV contrast. COMPARISON:  02/29/2020 FINDINGS: Lower chest: No acute abnormality. Hepatobiliary: No focal liver abnormality is seen. No gallstones, gallbladder wall thickening, or biliary dilatation. Pancreas: Unremarkable. No pancreatic ductal dilatation or surrounding inflammatory changes. Spleen: Normal in size without focal abnormality. Adrenals/Urinary Tract: Adrenal glands are within normal limits. Kidneys demonstrate no renal calculi or urinary tract obstructive changes. A few cysts are noted within left kidney stable in appearance from the prior exam. The bladder is well distended. No ureteral stones are seen. Stomach/Bowel: Mild fecal material is noted within the colon consistent with a degree of constipation. No obstructive or  inflammatory changes are noted. The appendix is within normal limits. Small bowel and stomach are unremarkable. Vascular/Lymphatic: Aortic atherosclerosis. No enlarged abdominal or pelvic lymph nodes. Reproductive: Status post hysterectomy. No adnexal masses. Other: No abdominal wall hernia or abnormality. No abdominopelvic ascites. Musculoskeletal: Mild degenerative changes of lumbar spine are noted. IMPRESSION: Mild constipation. No findings to correspond with the given clinical history. Electronically Signed   By: Inez Catalina M.D.   On: 04/22/2021 23:03   CT Head Wo Contrast  Result Date: 04/20/2021 CLINICAL DATA:  Recent fall yesterday with headaches and neck pain, initial EN EXAM: CT HEAD WITHOUT CONTRAST CT CERVICAL SPINE WITHOUT CONTRAST TECHNIQUE: Multidetector CT imaging of the head and cervical spine was performed following the standard protocol without intravenous contrast. Multiplanar CT image  reconstructions of the cervical spine were also generated. COMPARISON:  02/03/2021 FINDINGS: CT HEAD FINDINGS Brain: No evidence of acute infarction, hemorrhage, hydrocephalus, extra-axial collection or mass lesion/mass effect. 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 Vascular: No hyperdense vessel or unexpected calcification. Skull: Normal. Negative for fracture or focal lesion. Sinuses/Orbits: Mild mucosal changes are noted within the right sphenoid sinus. Other: None. CT CERVICAL SPINE FINDINGS Alignment: Within normal limits. Skull base and vertebrae: None cervical segments are well visualized. Disc space narrowing is noted at C5-6 and C6-7 with associated osteophytic changes. Mild facet hypertrophic changes are noted. No acute fracture or acute facet abnormality is seen. Soft tissues and spinal canal: Surrounding soft tissue structures are within normal limits. Vascular calcifications are seen. Upper chest: Visualized lung apices are unremarkable. Other: None IMPRESSION: CT of the head: Mild mucosal changes in the right sphenoid sinus. No acute intracranial abnormality noted. CT of the cervical spine: Multilevel degenerative change without acute abnormality. Electronically Signed   By: Inez Catalina M.D.   On: 04/12/2021 23:00   CT CERVICAL SPINE WO CONTRAST  Result Date: 05/07/2021 CLINICAL DATA:  Recent fall yesterday with headaches and neck pain, initial EN EXAM: CT HEAD WITHOUT CONTRAST CT CERVICAL SPINE WITHOUT CONTRAST TECHNIQUE: Multidetector CT imaging of the head and cervical spine was performed following the standard protocol without intravenous contrast. Multiplanar CT image reconstructions of the cervical spine were also generated. COMPARISON:  02/03/2021 FINDINGS: CT HEAD FINDINGS Brain: No evidence of acute infarction, hemorrhage, hydrocephalus, extra-axial collection or mass lesion/mass effect. Changes of prior infarct in the left frontal parietal  region are seen and stable. Vascular: No hyperdense vessel or unexpected calcification. Skull: Normal. Negative for fracture or focal lesion. Sinuses/Orbits: Mild mucosal changes are noted within the right sphenoid sinus. Other: None. CT CERVICAL SPINE FINDINGS Alignment: Within normal limits. Skull base and vertebrae: None cervical segments are well visualized. Disc space narrowing is noted at C5-6 and C6-7 with associated osteophytic changes. Mild facet hypertrophic changes are noted. No acute fracture or acute facet abnormality is seen. Soft tissues and spinal canal: Surrounding soft tissue structures are within normal limits. Vascular calcifications are seen. Upper chest: Visualized lung apices are unremarkable. Other: None IMPRESSION: CT of the head: Mild mucosal changes in the right sphenoid sinus. No acute intracranial abnormality noted. CT of the cervical spine: Multilevel degenerative change without acute abnormality. Electronically Signed   By: Inez Catalina M.D.   On: 04/29/2021 23:00   MR ANGIO HEAD WO CONTRAST  Result Date: 04/14/2021 CLINICAL DATA:  65 year old female with fall. Pain. Headaches. Left lower extremity weakness. Confused. EXAM: MRA HEAD WITHOUT CONTRAST TECHNIQUE: Angiographic images of the [place]  [place] were acquired using MRA technique without intravenous contrast. COMPARISON:  Brain MRI and neck MRA today. FINDINGS: Time-of-flight vessel signal is substantially degraded by metallic susceptibility artifact emanating from the left face. Subsequently, intracranial MRA of the anterior circulation is largely nondiagnostic. However, antegrade flow signal is detected in the distal vertebral arteries and basilar. And the right PICA origin, left AICA, SCA, and PCA origins also appear patent. IMPRESSION: Largely nondiagnostic intracranial MRA due to left face associated metal susceptibility artifact. Patency of the posterior circulation is evident. Electronically Signed   By: Genevie Ann M.D.    On: 04/14/2021 06:56   MR ANGIO NECK WO CONTRAST  Result Date: 04/14/2021 CLINICAL DATA:  65 year old female with fall. Pain. Headaches. Left lower extremity weakness. Confused. EXAM: MRA NECK WITHOUT CONTRAST TECHNIQUE: Angiographic images of the neck were acquired using MRA technique without intravenous contrast. Carotid stenosis measurements (when applicable) are obtained utilizing NASCET criteria, using the distal internal carotid diameter as the denominator. COMPARISON:  Brain and cervical spine MRI today. CTA head and neck 02/03/2021. FINDINGS: Noncontrast time-of-flight neck MRA imaging. 3 vessel arch configuration demonstrated. Antegrade flow signal in the bilateral cervical carotid and vertebral arteries which appears to continue to the skull base. Tortuosity of the right CCA in the lower neck at or just below the level of the thyroid (series 14, image 109), also demonstrated on the September CTA. But questionable associated hemodynamically significant stenosis there based on loss of MRA flow signal today. Streak artifact there from right subclavian venous contrast in September, but possible 60% stenosis by CTA. Right carotid bifurcation and visible cervical right ICA appear within normal limits. Mildly tortuous proximal left CCA. Left carotid bifurcation and visible cervical left ICA appear within normal limits. Patent proximal subclavian arteries and vertebral artery origins. Mildly dominant appearing left vertebral artery with no evidence of hemodynamically significant stenosis in the neck. IMPRESSION: 1. Cannot exclude hemodynamically significant stenosis of the Right CCA at the thoracic inlet, where tortuosity limited vessel detail on the September CTA. 2. But no other evidence of cervical carotid or vertebral artery hemodynamically significant stenosis. Electronically Signed   By: Genevie Ann M.D.   On: 04/14/2021 06:53   MR BRAIN WO CONTRAST  Result Date: 04/14/2021 CLINICAL DATA:  65 year old  female with fall. Pain. Headaches. Left lower extremity weakness. Confused. EXAM: MRI HEAD WITHOUT CONTRAST TECHNIQUE: Multiplanar, multiecho pulse sequences of the brain and surrounding structures were obtained without intravenous contrast. COMPARISON:  Head CT 04/17/2021 and earlier. FINDINGS: Brain: Susceptibility artifact related to retained round metallic foreign body in the left buccal space seen by CT. This degrades some portions of the exam, especially DWI and SWI. No restricted diffusion identified. No midline shift, mass effect, evidence of mass lesion, ventriculomegaly, extra-axial collection or acute intracranial hemorrhage. Cervicomedullary junction and pituitary are within normal limits. Subcortical oval encephalomalacia in the left frontal operculum. Mild regional white matter T2 and FLAIR hyperintensity. No cortical encephalomalacia identified. Other white matter signal appears normal for age. Deep gray matter nuclei, brainstem and cerebellum appear negative. Vascular: Major intracranial vascular flow voids are preserved. Skull and upper cervical spine: Dedicated cervical spine MRI reported separately. Negative visible cervical spine. Visualized bone marrow signal is within normal limits. Sinuses/Orbits: Left orbit and paranasal sinuses partially obscured by susceptibility artifact. Negative right orbit. Small volume layering fluid in the sphenoid sinuses where bubbly opacity was noted recently. Other: Mastoids are clear. Visible internal auditory structures appear normal. IMPRESSION: 1. Suboptimal exam due to left face  metallic foreign body susceptibility artifact, which significantly affects DWI and SWI. 2. No acute intracranial abnormality identified. 3. Small area of chronic encephalomalacia at the left frontal operculum. Electronically Signed   By: Genevie Ann M.D.   On: 04/14/2021 06:43   MR CERVICAL SPINE WO CONTRAST  Result Date: 04/14/2021 CLINICAL DATA:  65 year old female with fall. Pain.  Headaches. Left lower extremity weakness. Confused. EXAM: MRI CERVICAL SPINE WITHOUT CONTRAST TECHNIQUE: Multiplanar, multisequence MR imaging of the cervical spine was performed. No intravenous contrast was administered. COMPARISON:  Brain MRI today reported separately. CTA neck 02/03/2021. FINDINGS: Alignment: Increased cervical lordosis compared to the September CTA. No spondylolisthesis. Vertebrae: Mild susceptibility artifact at the skull base related to left face metallic foreign body. No convincing marrow edema or evidence of acute osseous abnormality. Normal background bone marrow signal. Cord: Capacious spinal canal at most levels. No spinal cord signal abnormality identified despite up to mild degenerative cord mass effect at C5 and C6, below. Negative visible upper thoracic spinal canal and spinal cord. Posterior Fossa, vertebral arteries, paraspinal tissues: Cervicomedullary junction is within normal limits. Brain is detailed separately. Preserved major vascular flow voids in the neck. Stable subcentimeter right thyroid STIR hyperintense nodule Not clinically significant; no follow-up imaging recommended (ref: J Am Coll Radiol. 2015 Feb;12(2): 143-50).Negative other visible neck soft tissues, lung apices. Disc levels: C2-C3:  Negative. C3-C4: Small central disc protrusion (series 12, image 22). Mild facet hypertrophy. No associated stenosis. C4-C5:  Negative disc.  Mild facet hypertrophy.  No stenosis. C5-C6: Disc space loss. Circumferential disc osteophyte complex with broad-based posterior component. Mild facet and ligament flavum hypertrophy. Foraminal stenosis greater on the right. Mild spinal stenosis with up to mild spinal cord mass effect. Mild left but moderate to severe right C6 foraminal stenosis. C6-C7: Disc space loss. Circumferential disc bulge with mild endplate spurring. Mild facet and ligament flavum hypertrophy. Mild spinal stenosis. No cord mass effect. Mild C7 foraminal stenosis.  C7-T1: Mild to moderate facet hypertrophy greater on the left. No stenosis. 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 Up to mild spinal cord mass effect at the former, but no cord signal abnormality identified. Associated moderate to severe right C6 neural foraminal stenosis. Electronically Signed   By: Genevie Ann M.D.   On: 04/14/2021 06:49   DG CHEST PORT 1 VIEW  Result Date: 04/14/2021 CLINICAL DATA:  65 year old female with history of shortness of breath. EXAM: PORTABLE CHEST 1 VIEW COMPARISON:  Chest x-ray 04/30/2021. FINDINGS: Lung volumes are low. No consolidative airspace disease. No pleural effusions. No pneumothorax. No pulmonary nodule or mass noted. Pulmonary vasculature and the cardiomediastinal silhouette are within normal limits. Atherosclerosis in the thoracic aorta. IMPRESSION: 1. Low lung volumes without radiographic evidence of acute cardiopulmonary disease. 2. Aortic atherosclerosis Electronically Signed   By: Vinnie Langton M.D.   On: 04/14/2021 06:38   DG Chest Port 1 View  Result Date: 05/03/2021 CLINICAL DATA:  Abdominal pain.  Unwitnessed fall. EXAM: PORTABLE CHEST 1 VIEW COMPARISON:  10/29/2020. FINDINGS: The heart size and mediastinal contours are within normal limits. Mild atherosclerotic calcification of the aorta is noted. Both lungs are clear. No acute osseous abnormality. IMPRESSION: No acute cardiopulmonary process. Electronically Signed   By: Brett Fairy M.D.   On: 04/08/2021 21:45   VAS [place] ABI WITH/WO TBI  Result Date: 04/14/2021  LOWER EXTREMITY DOPPLER STUDY Patient Name:  Folasade Mooty  Date of Exam:   04/14/2021  Medical Rec #: [id number]             Accession #:    [id number] Date of Birth: July 19, 1955              Patient Gender: F Patient Age:   48 years Exam Location:  [facility] Procedure:      VAS [place] ABI WITH/WO TBI Referring Phys: ANASTASSIA DOUTOVA  --------------------------------------------------------------------------------  Indications: Claudication.  Limitations: Today's exam was limited due to patient positioning, patient unable              to lie flat and involuntary patient movement. Comparison Study: no prior Performing Technologist: Agricultural engineer  Examination Guidelines: A complete evaluation includes at minimum, Doppler waveform signals and systolic blood pressure reading at the level of bilateral brachial, anterior tibial, and posterior tibial arteries, when vessel segments are accessible. Bilateral testing is considered an integral part of a complete examination. Photoelectric Plethysmograph (PPG) waveforms and toe systolic pressure readings are included as required and additional duplex testing as needed. Limited examinations for reoccurring indications may be performed as noted.  ABI Findings: +---------+------------------+-----+---------+---------------------------------+ Right    Rt Pressure (mmHg)IndexWaveform Comment                           +---------+------------------+-----+---------+---------------------------------+ Brachial                        triphasicunable to optain pressure due to                                           IV location                       +---------+------------------+-----+---------+---------------------------------+ PTA      92                0.76 biphasic                                   +---------+------------------+-----+---------+---------------------------------+ DP       97                0.80 biphasic                                   +---------+------------------+-----+---------+---------------------------------+ Great Toe                                unable to obtain pressure and                                              waveform due to patient movement  +---------+------------------+-----+---------+---------------------------------+  +---------+------------------+-----+---------+---------------------------------+ Left     Lt Pressure (mmHg)IndexWaveform Comment                           +---------+------------------+-----+---------+---------------------------------+ Brachial 121                    triphasic                                  +---------+------------------+-----+---------+---------------------------------+  PTA      74                0.61 biphasic                                   +---------+------------------+-----+---------+---------------------------------+ DP       75                0.62 biphasic                                   +---------+------------------+-----+---------+---------------------------------+ Great Toe                                unable to obtain pressure and                                              waveform due to patient movement  +---------+------------------+-----+---------+---------------------------------+ +-------+-----------+-----------+------------+------------+ ABI/TBIToday's ABIToday's TBIPrevious ABIPrevious TBI +-------+-----------+-----------+------------+------------+ Right  0.80                                           +-------+-----------+-----------+------------+------------+ Left   0.62                                           +-------+-----------+-----------+------------+------------+  Summary: Right: Resting right ankle-brachial index indicates mild right lower extremity arterial disease. Left: Resting left ankle-brachial index indicates moderate left lower extremity arterial disease.  *See table(s) above for measurements and observations.     Preliminary    ECHOCARDIOGRAM COMPLETE  Result Date: 04/14/2021    ECHOCARDIOGRAM REPORT   Patient Name:   KAREM FARHA Date of Exam: 04/14/2021 Medical Rec #:  [id number]            Height:       63.0 in Accession #:    [id number]           Weight:       160.0 lb Date of Birth:   04/23/1956             BSA:          1.759 m Patient Age:    39 years             BP:           141/66 mmHg Patient Gender: F                    HR:           121 bpm. Exam Location:  Inpatient Procedure: 2D Echo, Cardiac Doppler, Color Doppler and Intracardiac            Opacification Agent Indications:    R07.9* Chest pain, unspecified. Elevated troponin.  History:        Patient has no prior history of Echocardiogram examinations.                 Risk Factors:Current Smoker and Hypertension. Rhabdomyolysis.  Cancer.  Sonographer:    Roseanna Rainbow RDCS Referring Phys: [place]  Sonographer Comments: Technically difficult study due to poor echo windows, suboptimal parasternal window, suboptimal apical window and suboptimal subcostal window. Image acquisition challenging due to patient body habitus. Patient non verbal, could not turn. Patient in pain. IMPRESSIONS  1. Study is nondiagnostic due to very poor echo windows and poor visualization.  2. Left ventricular ejection fraction, by estimation, is 70 to 75%. The left ventricle has hyperdynamic function. Left ventricular endocardial border not optimally defined to evaluate regional wall motion. Left ventricular diastolic parameters are consistent with Grade I diastolic dysfunction (impaired relaxation).  3. Right ventricular systolic function was not well visualized. The right ventricular size is not well visualized. Systolic function appears normal based on very limited views.  4. The mitral valve is grossly normal. Trivial mitral valve regurgitation.  5. The aortic valve was not well visualized. Aortic valve regurgitation is not visualized.  6. The inferior vena cava is normal in size with greater than 50% respiratory variability, suggesting right atrial pressure of 3 mmHg. Comparison(s): No prior Echocardiogram. FINDINGS  Left Ventricle: Left ventricular ejection fraction, by estimation, is 70 to 75%. The left ventricle has hyperdynamic  function. Left ventricular endocardial border not optimally defined to evaluate regional wall motion. Definity contrast agent was given IV to delineate the left ventricular endocardial borders. The left ventricular internal cavity size was normal in size. Suboptimal image quality limits for assessment of left ventricular hypertrophy. Left ventricular diastolic parameters are consistent with Grade I diastolic dysfunction (impaired relaxation). Right Ventricle: The right ventricular size is not well visualized. Right vetricular wall thickness was not well visualized. Right ventricular systolic function was not well visualized. Left Atrium: Left atrial size was normal in size. Right Atrium: Right atrial size was not well visualized. Pericardium: The pericardium was not well visualized. Mitral Valve: The mitral valve is grossly normal. Trivial mitral valve regurgitation. Tricuspid Valve: The tricuspid valve is not well visualized. Tricuspid valve regurgitation is trivial. Aortic Valve: The aortic valve was not well visualized. Aortic valve regurgitation is not visualized. Pulmonic Valve: The pulmonic valve was not well visualized. Aorta: The aortic root and ascending aorta are structurally normal, with no evidence of dilitation. Venous: The inferior vena cava is normal in size with greater than 50% respiratory variability, suggesting right atrial pressure of 3 mmHg. IAS/Shunts: The interatrial septum was not well visualized.  LEFT VENTRICLE PLAX 2D LVIDd:         3.80 cm   Diastology LVIDs:         2.70 cm   LV e' medial:    5.87 cm/s LV PW:         1.20 cm   LV E/e' medial:  9.7 LV IVS:        1.00 cm   LV e' lateral:   10.00 cm/s LVOT diam:     1.90 cm   LV E/e' lateral: 5.7 LV SV:         65 LV SV Index:   37 LVOT Area:     2.84 cm  RIGHT VENTRICLE            IVC RV S prime:     8.49 cm/s  IVC diam: 1.80 cm LEFT ATRIUM             Index        RIGHT ATRIUM           Index LA diam:  4.00 cm 2.27 cm/m   RA  Area:     10.40 cm LA Vol (A2C):   27.1 ml 15.41 ml/m  RA Volume:   20.50 ml  11.66 ml/m LA Vol (A4C):   20.7 ml 11.77 ml/m LA Biplane Vol: 23.7 ml 13.48 ml/m  AORTIC VALVE LVOT Vmax:   163.00 cm/s LVOT Vmean:  104.000 cm/s LVOT VTI:    0.228 m  AORTA Ao Root diam: 2.60 cm MITRAL VALVE MV Area (PHT): 5.97 cm    SHUNTS MV Decel Time: 127 msec    Systemic VTI:  0.23 m MV E velocity: 57.10 cm/s  Systemic Diam: 1.90 cm MV A velocity: 96.90 cm/s MV E/A ratio:  0.59 Gwyndolyn Kaufman MD Electronically signed by Gwyndolyn Kaufman MD Signature Date/Time: 04/14/2021/12:20:29 PM    Final     PHYSICAL EXAM  Temp:  [97.5 F (36.4 C)-98.7 F (37.1 C)] 97.5 F (36.4 C) (12/08 0454) Pulse Rate:  [99-124] 124 (12/08 1200) Resp:  [13-56] 16 (12/08 1130) BP: (92-142)/(62-124) 115/87 (12/08 1200) SpO2:  [95 %-100 %] 95 % (12/08 1200)  General - Well nourished, well developed, in no apparent distress. Multiple bruises and abrasions on extremities  Ophthalmologic - fundi not visualized due to noncooperation.  Cardiovascular - Regular rhythm and rate.  Mental Status -  Patient is lethargic, easily arousable and confused. Oriented to person  Cranial Nerves II - XII - II - Visual field intact OU. III, IV, VI - Extraocular movements intact. V - Facial sensation intact bilaterally. VII - Facial movement intact bilaterally. VIII - Hearing & vestibular intact bilaterally. X - Palate elevates symmetrically. XI - Chin turning & shoulder shrug intact bilaterally. XII - Tongue protrusion intact.  Motor Strength - hard to distinguish true left side weakness due to left arm and left leg being very painful on movement.  But appears to move only against gravity in the left upper and lower extremity.  Right upper and lower 4/5. Does have asterixis Motor Tone - Muscle tone was assessed at the neck and appendages and was normal. Patient has asterixis bilaterally right arm greater than left.  There is muscle swelling  and tightness in the left forearm as well as calf with tenderness to touch Sensory - Light touch, temperature/pinprick were assessed and were symmetrical.    Coordination - The patient had normal movements in the hands and feet with no ataxia or dysmetria.  Tremor was absent.  Gait and Station - deferred.   ASSESSMENT/PLAN Lori [name] is a 65 y.o. female with history of  HTN, depression/anxiety, GERD, cervical cancer, tobacco abuse, cocaine abuse, chronic leg pain, chronic neuropathy,  hyponatremia, subclavian artery stenosis on the left Presented with abdominal pain and witnessed fall.  Has been on the floor for over 24 hrs.  Since was lying down her left arm was weak since she was laying down on  it. Last time was able to talk to her family was 4 PM last evening.  She has been having diarrhea and abdominal pain for the past 2 weeks. Had a emergency department visit on 5 December for lower extremity pain Dopplers of the legs were negative lumbar x-ray was negative and she was discharged home to follow-up with her primary care provider   In the ED, she was found to have severe hyponatremia, mild hyperkalemia, severe AKI, elevated CK and liver enzymes and lactic acid as well as leukocytosis. Patient was also found to have left arm and leg weakness, no  facial droop, and significant for left arm and leg pain.  Metabolic Encephalopathy with left arm and leg weakness secondary to pain suspect rhabdomyolysis muscle pain.  Doubt stroke CT head No acute abnormality.   Repeat CTH @ 2300 tonight  MRI   1. Suboptimal exam due to left face metallic foreign body susceptibility artifact, which significantly affects DWI and SWI. 2. No acute intracranial abnormality identified. 3. Small area of chronic encephalomalacia at the left frontal operculum. MRA   Largely nondiagnostic intracranial MRA due to left face associated metal susceptibility artifact.  Patency of the posterior circulation is  evident.  2D Echo  1. Study is nondiagnostic due to very poor echo windows and poor visualization.   2. Left ventricular ejection fraction, by estimation, is 70 to 75%. The left ventricle has hyperdynamic function. Left ventricular endocardial border not optimally defined to evaluate regional wall motion. Left  ventricular diastolic parameters are consistent with Grade I diastolic dysfunction (impaired relaxation).   3. Right ventricular systolic function was not well visualized. The right ventricular size is not well visualized. Systolic function appears normal based on very limited views.   4. The mitral valve is grossly normal. Trivial mitral valve regurgitation.   5. The aortic valve was not well visualized. Aortic valve regurgitation is not visualized.   6. The inferior vena cava is normal in size with greater than 50% respiratory variability, suggesting right atrial pressure of 3 mmHg.   LDL 57 HgbA1c 6.4 VTE prophylaxis - heparin/SCD's    Diet   Diet Heart Room service appropriate? Yes; Fluid consistency: Thin   aspirin 81 mg daily prior to admission, now on aspirin 81 mg daily.  Therapy recommendations:  pending Disposition:  pending  Hypertension Home meds:  zestoretic Stable Permissive hypertension (OK if < 220/120) but gradually normalize in 5-7 days Long-term BP goal normotensive  Hyperlipidemia Home meds:  atorvastatin, resumed in hospital LDL 57, goal < 70 Continue statin at discharge   Other Stroke Risk Factors  Cigarette smoker advised to stop smoking Substance abuse - UDS:  THC NONE DETECTED, Cocaine POSITIVE. Patient advised to stop using due to stroke risk.  Other Active Problems Acute hypoxic respiratory failure  AKI Rhabdomyolysis Hyponatremia  Metabolic acidosis  Hospital day # 0  Beulah Gandy, NP   STROKE MD NOTE :I have personally obtained history,examined this patient, reviewed notes, independently viewed imaging studies, participated in  medical decision making and plan of care.ROS completed by me personally and pertinent positives fully documented  I have made any additions or clarifications directly to the above note. Agree with note above.  Patient was found down lying on her side for unknown period of time presents with significant left arm and leg pain and weakness without significant facial weakness and MRI scan is suboptimal due to artifact but does not show definitive stroke.  MRI C-spine shows no significant compression.  Due to absence of significant facial weakness and presence of significant arm and leg pain I think her weakness on the left side is likely due to myofascial etiology.  Recommend aggressive hydration correction of metabolic parameters.  Repeat CT scan of the head in 24 hours.  Long discussion with patient and wife at the bedside and answered questions.  Discussed with Dr. Candiss Norse.  Greater than 50% time during the 35-minute visit was spent on counseling and coordination of care about her weakness derangement metabolic parameters and answering questions.  Antony Contras, MD Medical Director [facility] Pager: [phone] 04/14/2021  3:54 PM   To contact Stroke Continuity provider, please refer to [email or web address] After hours, contact General Neurology

## 2021-04-14 NOTE — Progress Notes (Signed)
Nephrology f/u note --  Rev'd f/u labs from 19:04 Na 130, K 6.7, Bicarb 18, BUn 53, Cr 3.7, Ca 7.5, Alb 2  Per MAR rec'd lokelma 15:56 and 20:59  Last rec'd insulin and dextrose ~7am On LR 75/hr after several liter boluses Afebrile 121/59 126 96% RA UOP recorded 715mL  IR has placed trialysis catheter.    Given urinating, will attempt to medically manage potassium   Just had lokelma, will retreat with insulin/dextrose, IV Caa, 1 amp na bicarb and lasix 80 IV  If no improvement on next check (Pending for 3am) will need to proceed to dialysis.    Let me know of issues.

## 2021-04-14 NOTE — Procedures (Signed)
Interventional Radiology Procedure Note  Procedure: Placement of a 19cm trialysis-type catheter via right IJ.  Tip in RA and ready for use.  Complications: None  Estimated Blood Loss: None  Recommendations: - Routine line care   Signed,  Criselda Peaches, MD

## 2021-04-14 NOTE — ED Notes (Signed)
Pt hard stick. Notified phlebotomy to assist with getting stat blood draw

## 2021-04-14 NOTE — Consult Note (Addendum)
[facility] Nephrology Consultation Note  Requesting MD: [name] MD  Reason for consult: Severe hyponatremia   HPI:  Lori [name] is a 65 y.o. female with a pmh of HTN, polysubstance use disorder, GERD, depression, tobacco use disorder, L subclavian artery stenosis.   History per chart, patient's niece, and patient.   Patient presented to the ED after being found down in her home by her brother. Patient stated on admission that she did not have syncope but slid down into the floor and could not get up. Her niece reports that she has been having LLE weakness for weeks and has had this worked up in the outpatient setting. She was scheduled to get imaging but it was canceled due to insurance. The patient was told to go to the ER 12/5. At that time patient had imaging of her lumbar spine which showed stable degenerative changes at L4-5 and she had venous duplexes of BLE which were negative for DVT. She was noted to have hypoNA 121, hypochloremia 88, and elevated sCr 1.29 up from 1.06 from 2 mo ago.   From the time she was discharged the patient continued to have lower leg pain. She last communicated with her sister a day prior to admission (12/6) at 4 pm. Her sister was unable to reach her after that time and eventually had a brother go check on the patient. She was found at least 24 hours later on the day of admission. Patient reportedly found to have feces around her.   Patient currently reports abdominal pain but no fever, chills, sob, or chest pain.   Creat  Date/Time Value Ref Range Status  12/02/2014 01:23 PM 1.00 0.50 - 1.05 mg/dL Final  06/12/2012 03:32 PM 0.80 0.50 - 1.10 mg/dL Final  11/20/2011 03:06 PM 0.92 0.50 - 1.10 mg/dL Final  09/14/2011 04:20 PM 0.99 0.50 - 1.10 mg/dL Final  06/12/2011 03:05 PM 0.96 0.50 - 1.10 mg/dL Final  10/25/2010 10:46 AM 0.91 0.50 - 1.10 mg/dL Final  06/20/2010 04:36 PM 0.90 0.40 - 1.20 mg/dL Final   Creatinine, Ser   Date/Time Value Ref Range Status  04/14/2021 08:10 AM 3.35 (H) 0.44 - 1.00 mg/dL Final  04/14/2021 02:51 AM 2.96 (H) 0.44 - 1.00 mg/dL Final  04/27/2021 10:01 PM 3.40 (H) 0.44 - 1.00 mg/dL Final  04/27/2021 09:30 PM 3.42 (H) 0.44 - 1.00 mg/dL Final  04/11/2021 02:45 PM 1.29 (H) 0.44 - 1.00 mg/dL Final  02/03/2021 10:10 AM 1.06 (H) 0.44 - 1.00 mg/dL Final  02/29/2020 04:00 AM 0.91 0.44 - 1.00 mg/dL Final  02/28/2020 04:10 AM 0.99 0.44 - 1.00 mg/dL Final  02/27/2020 02:43 AM 1.48 (H) 0.44 - 1.00 mg/dL Final  02/26/2020 08:17 PM 1.84 (H) 0.44 - 1.00 mg/dL Final  02/26/2020 03:06 PM 2.53 (H) 0.44 - 1.00 mg/dL Final  02/26/2020 02:58 PM 2.50 (H) 0.44 - 1.00 mg/dL Final  02/26/2020 02:52 PM 0.90 0.44 - 1.00 mg/dL Final  12/27/2017 02:37 PM 1.43 (H) 0.40 - 1.20 mg/dL Final  11/27/2017 06:02 AM 0.92 0.44 - 1.00 mg/dL Final  02/02/2017 03:40 PM 0.99 0.40 - 1.20 mg/dL Final  09/12/2016 05:28 AM 0.90 0.44 - 1.00 mg/dL Final  09/11/2016 04:03 AM 1.02 (H) 0.44 - 1.00 mg/dL Final  09/10/2016 10:09 PM 1.14 (H) 0.44 - 1.00 mg/dL Final  01/02/2016 01:15 PM 0.85 0.44 - 1.00 mg/dL Final  07/02/2015 02:45 PM 1.20 (H) 0.57 - 1.00 mg/dL Final  04/09/2014 06:10 AM 0.94 0.50 - 1.10 mg/dL Final  06/13/2012 06:25 AM 0.91 0.50 - 1.10 mg/dL Final  06/12/2012 09:30 PM 0.78 0.50 - 1.10 mg/dL Final  05/31/2010 03:20 PM 1.0 0.4 - 1.2 mg/dL Final  09/30/2008 02:50 PM 1.2 0.4 - 1.2 mg/dL Final     PMHx:   Past Medical History:  Diagnosis Date   Arthritis    Depression    Depression with anxiety 07/28/08   Patient had multiple death in her family over a short period of time. father, brother, uncle and niece who was stabbed 3 years ago. Patient's husband had a car accident last year and  need currently a feeding tube.    GERD (gastroesophageal reflux disease)    Gout    Headache(784.0)    migraines   History of cervical cancer  1985    status post partial hysterectomy , last Pap smear 10 years ago , no further  followup   History of cocaine abuse ([place]) 10/23/2008    last documented in 07-28-2005 when admitted for hypertensive urgency   Hypertension July 28, 2005   Admitted for HTN crisis in 10/2008. Was given in 07/28/08 a wrong prescription from the pharmacy and per ED note it was Lisinopril-Hydrocholorthizide which gave her a  hives and feeling of sickness. Patient was started  on this meds on 05/31/2010 by Dr  Ihor Gully without any problem.    Insomnia    Tobacco abuse     35 years   Uterine cancer [facility])     Past Surgical History:  Procedure Laterality Date   BIOPSY  02/28/2020   Procedure: BIOPSY;  Surgeon: Doran Stabler, MD;  Location: [facility] ENDOSCOPY;  Service: Gastroenterology;;   [place]   COLONOSCOPY WITH PROPOFOL N/A 03/01/2017   Procedure: COLONOSCOPY WITH PROPOFOL;  Surgeon: Milus Banister, MD;  Location: [facility] ENDOSCOPY;  Service: Endoscopy;  Laterality: N/A;   ESOPHAGOGASTRODUODENOSCOPY (EGD) WITH PROPOFOL N/A 03/01/2017   Procedure: ESOPHAGOGASTRODUODENOSCOPY (EGD) WITH PROPOFOL;  Surgeon: Milus Banister, MD;  Location: [facility] ENDOSCOPY;  Service: Endoscopy;  Laterality: N/A;   ESOPHAGOGASTRODUODENOSCOPY (EGD) WITH PROPOFOL N/A 02/28/2020   Procedure: ESOPHAGOGASTRODUODENOSCOPY (EGD) WITH PROPOFOL;  Surgeon: Doran Stabler, MD;  Location: [place];  Service: Gastroenterology;  Laterality: N/A;   PARTIAL HYSTERECTOMY     SKIN GRAFT      Family Hx:  Family History  Problem Relation Age of Onset   Thyroid disease Mother    Diabetes Father    Heart disease Father    Hyperlipidemia Father    Hypertension Father    Diabetes Brother    Hypertension Brother    Hyperlipidemia Brother    Colon polyps Brother    Thyroid disease Daughter    Obesity Daughter    Mental illness Daughter    Colon cancer Maternal Uncle    Stomach cancer Neg Hx    Pancreatic cancer Neg Hx     Social History:  reports that she has been smoking cigarettes. She has a 21.50 pack-year smoking  history. She has never used smokeless tobacco. She reports that she does not drink alcohol and does not use drugs.  Allergies: No Known Allergies  Medications: Prior to Admission medications   Medication Sig Start Date End Date Taking? Authorizing Provider  acetaminophen (TYLENOL) 325 MG tablet Take 2 tablets (650 mg total) by mouth every 6 (six) hours as needed. 11/03/19   Darr, [place] Nasuti, PA-C  acetaminophen (TYLENOL) 500 MG tablet Take 500-1,000 mg by mouth every 8 (eight) hours as needed (for headaches).  [provider]  albuterol (VENTOLIN HFA) 108 (90 Base) MCG/ACT inhaler Inhale 1-2 puffs into the lungs every 6 (six) hours as needed for wheezing or shortness of breath. 11/03/19   Darr, [place] Nasuti, PA-C  ALPRAZolam Duanne Moron) 0.5 MG tablet Take 0.5-1 mg by mouth See admin instructions. Take 0.5-1 mg by mouth at bedtime and an additional 0.5 mg up to two times a day as needed for anxiety    [provider]  aspirin EC 81 MG tablet Take 1 tablet (81 mg total) by mouth daily. Swallow whole. 02/10/21   Jerline Pain, MD  atorvastatin (LIPITOR) 40 MG tablet Take 1 tablet (40 mg total) by mouth daily. 02/10/21   Jerline Pain, MD  cyclobenzaprine (FLEXERIL) 10 MG tablet Take 10 mg by mouth 3 (three) times daily.    [provider]  gabapentin (NEURONTIN) 300 MG capsule TAKE 1 CAPSULE BY MOUTH EVERYDAY AT BEDTIME 11/09/17   Newt Minion, MD  gabapentin (NEURONTIN) 400 MG capsule Take 400 mg by mouth 4 (four) times daily. 01/17/21   [provider]  lisinopril-hydrochlorothiazide (ZESTORETIC) 20-12.5 MG tablet Take 1 tablet by mouth at bedtime. 12/27/20   [provider]  pantoprazole (PROTONIX) 40 MG tablet Take 1 tablet (40 mg total) by mouth 2 (two) times daily. 03/05/17 04/05/21  Milus Banister, MD  QUEtiapine (SEROQUEL) 100 MG tablet Take 100 mg by mouth in the morning and at bedtime.    [provider]  zolpidem (AMBIEN) 5 MG tablet Take 5 mg by mouth  at bedtime. 01/25/21   [provider]    I have reviewed the patient's current medications.  Labs:  Results for orders placed or performed during the hospital encounter of 04/20/2021 (from the past 48 hour(s))  Resp Panel by RT-PCR (Flu A&B, Covid) Nasopharyngeal Swab     Status: None   Collection Time: 04/23/2021  8:57 PM   Specimen: Nasopharyngeal Swab; Nasopharyngeal(NP) swabs in vial transport medium  Result Value Ref Range   SARS Coronavirus 2 by RT PCR NEGATIVE NEGATIVE    Comment: (NOTE) SARS-CoV-2 target nucleic acids are NOT DETECTED.  The SARS-CoV-2 RNA is generally detectable in upper respiratory specimens during the acute phase of infection. The lowest concentration of SARS-CoV-2 viral copies this assay can detect is 138 copies/mL. A negative result does not preclude SARS-Cov-2 infection and should not be used as the sole basis for treatment or other patient management decisions. A negative result may occur with  improper specimen collection/handling, submission of specimen other than nasopharyngeal swab, presence of viral mutation(s) within the areas targeted by this assay, and inadequate number of viral copies(<138 copies/mL). A negative result must be combined with clinical observations, patient history, and epidemiological information. The expected result is Negative.  Fact Sheet for Patients:  EntrepreneurPulse.com.au  Fact Sheet for Healthcare Providers:  IncredibleEmployment.be  This test is no t yet approved or cleared by the [place] FDA and  has been authorized for detection and/or diagnosis of SARS-CoV-2 by FDA under an Emergency Use Authorization (EUA). This EUA will remain  in effect (meaning this test can be used) for the duration of the COVID-19 declaration under Section 564(b)(1) of the Act, 21 U.S.C.section 360bbb-3(b)(1), unless the authorization is terminated  or revoked sooner.       Influenza A by  PCR NEGATIVE NEGATIVE   Influenza B by PCR NEGATIVE NEGATIVE    Comment: (NOTE) The Xpert Xpress SARS-CoV-2/FLU/RSV plus assay is intended as an aid in  the diagnosis of influenza from Nasopharyngeal swab specimens and should not be used as a sole basis for treatment. Nasal washings and aspirates are unacceptable for Xpert Xpress SARS-CoV-2/FLU/RSV testing.  Fact Sheet for Patients: EntrepreneurPulse.com.au  Fact Sheet for Healthcare Providers: IncredibleEmployment.be  This test is not yet approved or cleared by the [place] FDA and has been authorized for detection and/or diagnosis of SARS-CoV-2 by FDA under an Emergency Use Authorization (EUA). This EUA will remain in effect (meaning this test can be used) for the duration of the COVID-19 declaration under Section 564(b)(1) of the Act, 21 U.S.C. section 360bbb-3(b)(1), unless the authorization is terminated or revoked.  Performed at [facility], [place] [address], [place], [place] [address]   Comprehensive metabolic panel     Status: Abnormal   Collection Time: 04/12/2021  9:30 PM  Result Value Ref Range   Sodium 121 (L) 135 - 145 mmol/L   Potassium 5.8 (H) 3.5 - 5.1 mmol/L   Chloride 87 (L) 98 - 111 mmol/L   CO2 14 (L) 22 - 32 mmol/L   Glucose, Bld 191 (H) 70 - 99 mg/dL    Comment: Glucose reference range applies only to samples taken after fasting for at least 8 hours.   BUN 41 (H) 8 - 23 mg/dL   Creatinine, Ser 3.42 (H) 0.44 - 1.00 mg/dL   Calcium 8.9 8.9 - 10.3 mg/dL   Total Protein 7.4 6.5 - 8.1 g/dL   Albumin 2.7 (L) 3.5 - 5.0 g/dL   AST 1,579 (H) 15 - 41 U/L   ALT 504 (H) 0 - 44 U/L   Alkaline Phosphatase 273 (H) 38 - 126 U/L   Total Bilirubin 0.8 0.3 - 1.2 mg/dL   GFR, Estimated 14 (L) >60 mL/min    Comment: (NOTE) Calculated using the CKD-EPI Creatinine Equation (2021)    Anion gap 20 (H) 5 - 15    Comment: Performed at [facility], [place] [address]  [place], [place] [address]  Lactic acid, plasma     Status: Abnormal   Collection Time: 04/12/2021  9:30 PM  Result Value Ref Range   Lactic Acid, Venous 6.0 (HH) 0.5 - 1.9 mmol/L    Comment: CRITICAL RESULT CALLED TO, READ BACK BY AND VERIFIED WITH: Alvino Chapel RN 04/12/2021 2214 Wiliam Ke Performed at [facility], [place] [address] [place], [place] [address]   Lipase, blood     Status: None   Collection Time: 05/04/2021  9:30 PM  Result Value Ref Range   Lipase 29 11 - 51 U/L    Comment: Performed at [place] [address], [place], [place] [address]  Troponin I (High Sensitivity)     Status: Abnormal   Collection Time: 04/08/2021  9:30 PM  Result Value Ref Range   Troponin I (High Sensitivity) 662 (HH) <18 ng/L    Comment: CRITICAL RESULT CALLED TO, READ BACK BY AND VERIFIED WITH: A BRITT,RN 2232 05/07/2021 WBOND (NOTE) Elevated high sensitivity troponin I (hsTnI) values and significant  changes across serial measurements may suggest ACS but many other  chronic and acute conditions are known to elevate hsTnI results.  Refer to the Links section for chest pain algorithms and additional  guidance. Performed at [facility], [place] [address] [place], [place] [address]   CBC with Differential     Status: Abnormal   Collection Time: 04/14/2021  9:30 PM  Result Value Ref Range   WBC 19.4 (  H) 4.0 - 10.5 K/uL   RBC 3.81 (L) 3.87 - 5.11 MIL/uL   Hemoglobin 12.0 12.0 - 15.0 g/dL   HCT 35.9 (L) 36.0 - 46.0 %   MCV 94.2 80.0 - 100.0 fL   MCH 31.5 26.0 - 34.0 pg   MCHC 33.4 30.0 - 36.0 g/dL   RDW 13.9 11.5 - 15.5 %   Platelets 674 (H) 150 - 400 K/uL   nRBC 0.0 0.0 - 0.2 %   Neutrophils Relative % 89 %   Neutro Abs 17.4 (H) 1.7 - 7.7 K/uL   Lymphocytes Relative 6 %   Lymphs Abs 1.1 0.7 - 4.0 K/uL   Monocytes Relative 3 %   Monocytes Absolute 0.6 0.1 - 1.0 K/uL   Eosinophils Relative 1 %   Eosinophils Absolute 0.1 0.0 - 0.5 K/uL   Basophils Relative 0 %   Basophils  Absolute 0.0 0.0 - 0.1 K/uL   Immature Granulocytes 1 %   Abs Immature Granulocytes 0.10 (H) 0.00 - 0.07 K/uL    Comment: Performed at [facility], [address], [place], [place] [address]  CK     Status: Abnormal   Collection Time: 04/09/2021  9:30 PM  Result Value Ref Range   Total CK >50,000 (H) 38 - 234 U/L    Comment: RESULTS CONFIRMED BY MANUAL DILUTION Performed at [facility], [place] [address], [place], [place] [address]   Type and screen [place]     Status: None   Collection Time: 04/12/2021  9:30 PM  Result Value Ref Range   ABO/RH(D) O POS    Antibody Screen NEG    Sample Expiration      04/16/2021,2359 Performed at [facility], [place] [address], [place], [place] [address]   I-stat chem 8, ED     Status: Abnormal   Collection Time: 05/01/2021 10:01 PM  Result Value Ref Range   Sodium 119 (LL) 135 - 145 mmol/L   Potassium 5.6 (H) 3.5 - 5.1 mmol/L   Chloride 92 (L) 98 - 111 mmol/L   BUN 46 (H) 8 - 23 mg/dL   Creatinine, Ser 3.40 (H) 0.44 - 1.00 mg/dL   Glucose, Bld 183 (H) 70 - 99 mg/dL    Comment: Glucose reference range applies only to samples taken after fasting for at least 8 hours.   Calcium, Ion 1.03 (L) 1.15 - 1.40 mmol/L   TCO2 18 (L) 22 - 32 mmol/L   Hemoglobin 11.6 (L) 12.0 - 15.0 g/dL   HCT 34.0 (L) 36.0 - 46.0 %   Comment NOTIFIED PHYSICIAN   I-Stat venous blood gas, ED     Status: Abnormal   Collection Time: 04/25/2021 10:01 PM  Result Value Ref Range   pH, Ven 7.245 (L) 7.250 - 7.430   pCO2, Ven 36.7 (L) 44.0 - 60.0 mmHg   pO2, Ven 52.0 (H) 32.0 - 45.0 mmHg   Bicarbonate 15.9 (L) 20.0 - 28.0 mmol/L   TCO2 17 (L) 22 - 32 mmol/L   O2 Saturation 80.0 %   Acid-base deficit 11.0 (H) 0.0 - 2.0 mmol/L   Sodium 120 (L) 135 - 145 mmol/L   Potassium 5.6 (H) 3.5 - 5.1 mmol/L   Calcium, Ion 1.04 (L) 1.15 - 1.40 mmol/L   HCT 35.0 (L) 36.0 - 46.0 %   Hemoglobin 11.9 (L) 12.0 - 15.0 g/dL   Sample type VENOUS   Protime-INR      Status: Abnormal  Collection Time: 04/14/21  1:40 AM  Result Value Ref Range   Prothrombin Time 18.6 (H) 11.4 - 15.2 seconds   INR 1.6 (H) 0.8 - 1.2    Comment: (NOTE) INR goal varies based on device and disease states. Performed at [facility], [place] [address] [place], [place] [address]   Troponin I (High Sensitivity)     Status: Abnormal   Collection Time: 04/14/21  1:40 AM  Result Value Ref Range   Troponin I (High Sensitivity) 642 (HH) <18 ng/L    Comment: CRITICAL VALUE NOTED.  VALUE IS CONSISTENT WITH PREVIOUSLY REPORTED AND CALLED VALUE. (NOTE) Elevated high sensitivity troponin I (hsTnI) values and significant  changes across serial measurements may suggest ACS but many other  chronic and acute conditions are known to elevate hsTnI results.  Refer to the Links section for chest pain algorithms and additional  guidance. Performed at [facility], [place] [address], [place], [place] [address]   Acetaminophen level     Status: Abnormal   Collection Time: 04/14/21  1:40 AM  Result Value Ref Range   Acetaminophen (Tylenol), Serum <10 (L) 10 - 30 ug/mL    Comment: (NOTE) Therapeutic concentrations vary significantly. A range of 10-30 ug/mL  may be an effective concentration for many patients. However, some  are best treated at concentrations outside of this range. Acetaminophen concentrations >150 ug/mL at 4 hours after ingestion  and >50 ug/mL at 12 hours after ingestion are often associated with  toxic reactions.  Performed at [facility], [place] [address] [place], [place] [address]   Ammonia     Status: None   Collection Time: 04/14/21  1:40 AM  Result Value Ref Range   Ammonia 28 9 - 35 umol/L    Comment: Performed at [facility], [place] [address], [place], [place] [address]  Lactic acid, plasma     Status: Abnormal   Collection Time: 04/14/21  1:40 AM  Result Value Ref Range   Lactic Acid, Venous 3.2 (HH) 0.5 - 1.9 mmol/L    Comment:  CRITICAL VALUE NOTED.  VALUE IS CONSISTENT WITH PREVIOUSLY REPORTED AND CALLED VALUE. Performed at [facility], [place] [address], [place], [place] [address]   Procalcitonin     Status: None   Collection Time: 04/14/21  1:40 AM  Result Value Ref Range   Procalcitonin 9.20 ng/mL    Comment:        Interpretation: PCT > 2 ng/mL: Systemic infection (sepsis) is likely, unless other causes are known. (NOTE)       Sepsis PCT Algorithm           Lower Respiratory Tract                                      Infection PCT Algorithm    ----------------------------     ----------------------------         PCT < 0.25 ng/mL                PCT < 0.10 ng/mL          Strongly encourage             Strongly discourage   discontinuation of antibiotics    initiation of antibiotics    ----------------------------     -----------------------------       PCT 0.25 - 0.50 ng/mL  PCT 0.10 - 0.25 ng/mL               OR       >80% decrease in PCT            Discourage initiation of                                            antibiotics      Encourage discontinuation           of antibiotics    ----------------------------     -----------------------------         PCT >= 0.50 ng/mL              PCT 0.26 - 0.50 ng/mL               AND       <80% decrease in PCT              Encourage initiation of                                             antibiotics       Encourage continuation           of antibiotics    ----------------------------     -----------------------------        PCT >= 0.50 ng/mL                  PCT > 0.50 ng/mL               AND         increase in PCT                  Strongly encourage                                      initiation of antibiotics    Strongly encourage escalation           of antibiotics                                     -----------------------------                                           PCT <= 0.25 ng/mL                                                  OR                                        > 80% decrease in PCT  Discontinue / Do not initiate                                             antibiotics  Performed at [facility], [place] [address], [place], [place] [address]   TSH     Status: None   Collection Time: 04/14/21  1:40 AM  Result Value Ref Range   TSH 0.807 0.350 - 4.500 uIU/mL    Comment: Performed by a 3rd Generation assay with a functional sensitivity of <=0.01 uIU/mL. Performed at [facility], [place] [address] [place], [place] [address]   Ethanol     Status: None   Collection Time: 04/14/21  1:40 AM  Result Value Ref Range   Alcohol, Ethyl (B) <10 <10 mg/dL    Comment: (NOTE) Lowest detectable limit for serum alcohol is 10 mg/dL.  For medical purposes only. Performed at [facility], [place] [address] [place], [place] [address]   Gamma GT     Status: None   Collection Time: 04/14/21  1:40 AM  Result Value Ref Range   GGT 22 7 - 50 U/L    Comment: Performed at [facility], [place] [address] [place], [place] [address]  Lactic acid, plasma     Status: Abnormal   Collection Time: 04/14/21  2:51 AM  Result Value Ref Range   Lactic Acid, Venous 2.2 (HH) 0.5 - 1.9 mmol/L    Comment: CRITICAL VALUE NOTED.  VALUE IS CONSISTENT WITH PREVIOUSLY REPORTED AND CALLED VALUE. Performed at [facility], [place] [address], [place], [place] [address]   Basic metabolic panel     Status: Abnormal   Collection Time: 04/14/21  2:51 AM  Result Value Ref Range   Sodium 126 (L) 135 - 145 mmol/L    Comment: DELTA CHECK NOTED   Potassium 6.0 (H) 3.5 - 5.1 mmol/L   Chloride 96 (L) 98 - 111 mmol/L   CO2 21 (L) 22 - 32 mmol/L   Glucose, Bld 158 (H) 70 - 99 mg/dL    Comment: Glucose reference range applies only to samples taken after fasting for at least 8 hours.   BUN 42 (H) 8 - 23 mg/dL   Creatinine, Ser 2.96 (H) 0.44 - 1.00 mg/dL   Calcium 7.2 (L) 8.9 - 10.3 mg/dL    GFR, Estimated 17 (L) >60 mL/min    Comment: (NOTE) Calculated using the CKD-EPI Creatinine Equation (2021)    Anion gap 9 5 - 15    Comment: Performed at [place] [address], [place], [place] [address]  CBC with Differential/Platelet     Status: Abnormal   Collection Time: 04/14/21  2:51 AM  Result Value Ref Range   WBC 9.6 4.0 - 10.5 K/uL   RBC 3.68 (L) 3.87 - 5.11 MIL/uL   Hemoglobin 11.4 (L) 12.0 - 15.0 g/dL   HCT 34.0 (L) 36.0 - 46.0 %   MCV 92.4 80.0 - 100.0 fL   MCH 31.0 26.0 - 34.0 pg   MCHC 33.5 30.0 - 36.0 g/dL   RDW 14.1 11.5 - 15.5 %   Platelets 565 (H) 150 - 400 K/uL   nRBC 0.0 0.0 - 0.2 %   Neutrophils Relative % 48 %   Lymphocytes Relative 21 %   Monocytes Relative 2 %   Eosinophils Relative 0 %  Basophils Relative 0 %   Band Neutrophils 20 %   Metamyelocytes Relative 9 %   Myelocytes 0 %   Promyelocytes Relative 0 %   Blasts 0 %   nRBC 0 0 /100 WBC   Other 0 %   Neutro Abs 6.5 1.7 - 7.7 K/uL   Lymphs Abs 2.0 0.7 - 4.0 K/uL   Monocytes Absolute 0.2 0.1 - 1.0 K/uL   Eosinophils Absolute 0.0 0.0 - 0.5 K/uL   Basophils Absolute 0.0 0.0 - 0.1 K/uL   Abs Immature Granulocytes 0.86 (H) 0.00 - 0.07 K/uL   WBC Morphology MILD LEFT SHIFT (1-5% METAS, OCC MYELO, OCC BANDS)     Comment: INCREASED BANDS (>20% BANDS)   Polychromasia PRESENT     Comment: Performed at [facility], [place] [address], [place], [place] [address]  Magnesium     Status: None   Collection Time: 04/14/21  2:51 AM  Result Value Ref Range   Magnesium 2.1 1.7 - 2.4 mg/dL    Comment: Performed at [facility], [place] [address] [place], [place] [address]  Phosphorus     Status: Abnormal   Collection Time: 04/14/21  2:51 AM  Result Value Ref Range   Phosphorus 6.4 (H) 2.5 - 4.6 mg/dL    Comment: Performed at [place] [address], [place], [place] [address]  Osmolality     Status: None   Collection Time: 04/14/21  2:51 AM  Result Value Ref Range    Osmolality 286 275 - 295 mOsm/kg    Comment: Performed at [facility], [place] [place], [place] [address]  CK     Status: Abnormal   Collection Time: 04/14/21  5:51 AM  Result Value Ref Range   Total CK >50,000 (H) 38 - 234 U/L    Comment: RESULTS CONFIRMED BY MANUAL DILUTION Performed at [facility], [place] [address] [place], [place] [address]   Hepatic function panel     Status: Abnormal   Collection Time: 04/14/21  5:51 AM  Result Value Ref Range   Total Protein 5.5 (L) 6.5 - 8.1 g/dL   Albumin 1.9 (L) 3.5 - 5.0 g/dL   AST 1,844 (H) 15 - 41 U/L   ALT 575 (H) 0 - 44 U/L   Alkaline Phosphatase 269 (H) 38 - 126 U/L   Total Bilirubin 0.5 0.3 - 1.2 mg/dL   Bilirubin, Direct 0.2 0.0 - 0.2 mg/dL   Indirect Bilirubin 0.3 0.3 - 0.9 mg/dL    Comment: Performed at [place] [address], [place], [place] [address]  Prealbumin     Status: Abnormal   Collection Time: 04/14/21  5:51 AM  Result Value Ref Range   Prealbumin 5.7 (L) 18 - 38 mg/dL    Comment: Performed at [facility] [address], [place], [place] [address]  Folate     Status: None   Collection Time: 04/14/21  5:51 AM  Result Value Ref Range   Folate 24.7 >5.9 ng/mL    Comment: Performed at [facility] [address] [place], [place] [address]  Iron and TIBC     Status: Abnormal   Collection Time: 04/14/21  5:51 AM  Result Value Ref Range   Iron 25 (L) 28 - 170 ug/dL   TIBC 155 (L) 250 - 450 ug/dL   Saturation Ratios 16 10.4 - 31.8 %   UIBC 130 ug/dL  Comment: Performed at [facility], [place] [address] [place], [place] [address]  Ferritin     Status: Abnormal   Collection Time: 04/14/21  5:51 AM  Result Value Ref Range   Ferritin 4,433 (H) 11 - 307 ng/mL    Comment: Performed at [facility], [place] [address], [place], [place] [address]  Troponin I (High Sensitivity)     Status: Abnormal   Collection Time: 04/14/21  5:51 AM  Result Value Ref Range    Troponin I (High Sensitivity) 494 (HH) <18 ng/L    Comment: CRITICAL VALUE NOTED.  VALUE IS CONSISTENT WITH PREVIOUSLY REPORTED AND CALLED VALUE. (NOTE) Elevated high sensitivity troponin I (hsTnI) values and significant  changes across serial measurements may suggest ACS but many other  chronic and acute conditions are known to elevate hsTnI results.  Refer to the Links section for chest pain algorithms and additional  guidance. Performed at [facility], [place] [address] [place], [place] [address]   Lipid panel     Status: Abnormal   Collection Time: 04/14/21  5:51 AM  Result Value Ref Range   Cholesterol 108 0 - 200 mg/dL   Triglycerides 87 <150 mg/dL   HDL 34 (L) >40 mg/dL   Total CHOL/HDL Ratio 3.2 RATIO   VLDL 17 0 - 40 mg/dL   LDL Cholesterol 57 0 - 99 mg/dL    Comment:        Total Cholesterol/HDL:CHD Risk Coronary Heart Disease Risk Table                     Men   Women  1/2 Average Risk   3.4   3.3  Average Risk       5.0   4.4  2 X Average Risk   9.6   7.1  3 X Average Risk  23.4   11.0        Use the calculated Patient Ratio above and the CHD Risk Table to determine the patient's CHD Risk.        ATP III CLASSIFICATION (LDL):  <100     mg/dL   Optimal  100-129  mg/dL   Near or Above                    Optimal  130-159  mg/dL   Borderline  160-189  mg/dL   High  >190     mg/dL   Very High Performed at [facility] [address], [place], [place] [address]   Comprehensive metabolic panel     Status: Abnormal   Collection Time: 04/14/21  8:10 AM  Result Value Ref Range   Sodium 127 (L) 135 - 145 mmol/L   Potassium 5.4 (H) 3.5 - 5.1 mmol/L   Chloride 96 (L) 98 - 111 mmol/L   CO2 16 (L) 22 - 32 mmol/L   Glucose, Bld 190 (H) 70 - 99 mg/dL    Comment: Glucose reference range applies only to samples taken after fasting for at least 8 hours.   BUN 47 (H) 8 - 23 mg/dL   Creatinine, Ser 3.35 (H) 0.44 - 1.00 mg/dL   Calcium 9.9 8.9 - 10.3 mg/dL    Total Protein 5.2 (L) 6.5 - 8.1 g/dL   Albumin 1.9 (L) 3.5 - 5.0 g/dL   AST 1,647 (H) 15 - 41 U/L   ALT 513 (H) 0 - 44 U/L   Alkaline Phosphatase 261 (H) 38 -  126 U/L   Total Bilirubin 0.6 0.3 - 1.2 mg/dL   GFR, Estimated 15 (L) >60 mL/min    Comment: (NOTE) Calculated using the CKD-EPI Creatinine Equation (2021)    Anion gap 15 5 - 15    Comment: Performed at [facility] [address] [place], [place] [address]  Hemoglobin A1c     Status: Abnormal   Collection Time: 04/14/21  8:10 AM  Result Value Ref Range   Hgb A1c MFr Bld 6.4 (H) 4.8 - 5.6 %    Comment: (NOTE) Pre diabetes:          5.7%-6.4%  Diabetes:              >6.4%  Glycemic control for   <7.0% adults with diabetes    Mean Plasma Glucose 136.98 mg/dL    Comment: Performed at [facility] [address] [place], [place] [address]  Reticulocytes     Status: Abnormal   Collection Time: 04/14/21  8:10 AM  Result Value Ref Range   Retic Ct Pct 1.7 0.4 - 3.1 %   RBC. 3.54 (L) 3.87 - 5.11 MIL/uL   Retic Count, Absolute 61.2 19.0 - 186.0 K/uL   Immature Retic Fract 31.0 (H) 2.3 - 15.9 %    Comment: Performed at [facility] [address] [place], [place] [address]  I-Stat arterial blood gas, [facility] ED)     Status: Abnormal   Collection Time: 04/14/21  8:24 AM  Result Value Ref Range   pH, Arterial 7.359 7.350 - 7.450   pCO2 arterial 27.5 (L) 32.0 - 48.0 mmHg   pO2, Arterial 261 (H) 83.0 - 108.0 mmHg   Bicarbonate 15.5 (L) 20.0 - 28.0 mmol/L   TCO2 16 (L) 22 - 32 mmol/L   O2 Saturation 100.0 %   Acid-base deficit 9.0 (H) 0.0 - 2.0 mmol/L   Sodium 126 (L) 135 - 145 mmol/L   Potassium 5.2 (H) 3.5 - 5.1 mmol/L   Calcium, Ion 1.14 (L) 1.15 - 1.40 mmol/L   HCT 35.0 (L) 36.0 - 46.0 %   Hemoglobin 11.9 (L) 12.0 - 15.0 g/dL   Sample type ARTERIAL      ROS:  Pertinent items are noted in HPI.  Physical Exam: Vitals:   04/14/21 0709 04/14/21 0900  BP:  (!) 141/66  Pulse: (!) 119   Resp: (!)  56   Temp:    SpO2: 98%      General exam: Appears calm and uncomfortable  HEENT: Dry mucous membranes  Respiratory system: Clear to auscultation. Respiratory effort normal. No wheezing or crackle Cardiovascular system: S1 & S2 heard, RRR.  No pedal edema. Gastrointestinal system: Abdomen is nondistended, soft. TTP diffusely. Normal bowel sounds heard. Central nervous system: Alert and oriented to person and time. Bilateral hip weakness, LLE weakness at the knee 0-1/5. BUE 5/5 Extremities: LLE calf firm, TTP Skin: Mottled skin of LLE Psychiatry: Minimally conversant. Answers most question appropriately. affect appropriate.   Assessment/Plan: 1.Renal-  Patient noted to have AKI on presentation that is likely multifactorial in etiology. Patient reportedly had diarrhea prior to presenting to the ED as well as poor oral intake while she was lying on the floor. Additionally, patient was found to have rhabdomyolysis and urinary retention on presentation to the ED. Finally, she is on lisinopril/HCTz for hypertension. She has received 4L of IVFs and started on LR at 75 cc/h. Foley was placed after she was found to have distended  bladder on CT A/P which returned 2L of brown colored urine. CT A/P was negative for evidence of hydronephrosis. Patient noted to have UDS + for cocaine which is likely etiology of her rhabdomyolysis in addition prolonged immobility. -Continue IVFs, agree with LR 75cc/h to avoid over correction of patient's hyponatremia.  -Discontinue ACEI/HCTZ -Avoid nephrotoxic agents -Renal U/S when able given evidence of L sided renal cyst  -Strict Is&Os, monitor UOP -F/u urine studies    2. Rhabdomyolysis/Troponinemia/Hyperkalemia: In the setting of cocaine use and prolonged immobility. Patient also noted to have elevated potassium in the setting of significant rhabdomyolysis. She was received hyperkalemia treatment to shift the potassium and 20g of lokelma this AM. EKG without peaked t  waves -Continue IVF per above.  -Continue lokelma  -F/u PM potassium level  -Will continue to monitor for potential HD, if potassium remains elevated despite potassium binder   3. Hypertension/volume  - Patient significantly volume down in the setting of poor oral intake and diarrhea over the last couple of days. Echo was notable for hyperdynamic LV w/ estimated EF of 75%. However, patient was noted to have hypertension and tachycardia likely secondary to cocaine use. Hypovolemia also likely contributing to tachycardia -Continue IVF per above -Benzodiazepine use as needed -Hold home antihypertensives for now  4. Hyponatremia Patient noted to have Na of 121 12/5, nadir of 119 this hospitalization with improvement to 127 on AM labs. Likely secondary to hypovolemia.  -F/u renal function panel PM -Continue IVF per above  -Avoid overcorrection   Tobacco use disorder  Cocaine use disorder Dementia   Rick Duff, MD Internal Medicine Resident PGY-2 04/14/2021, 10:41 AM

## 2021-04-14 NOTE — ED Notes (Signed)
Pt will not drink Kayexalate. Attempted four times.

## 2021-04-14 NOTE — ED Notes (Signed)
Pateint didn't have bladder scanned prior to foley , scanner not available,

## 2021-04-14 NOTE — ED Notes (Signed)
Patient still in MRI.  

## 2021-04-14 NOTE — ED Notes (Signed)
Pt was placed on 2L of O2 Sat previous shift d/t pt having poor pulse ox reading. RN placing pulse ox on forehead Room Air 95%

## 2021-04-14 NOTE — ED Notes (Signed)
Patient remains in MRI 

## 2021-04-14 NOTE — ED Notes (Signed)
Danny called on room phone to speak to pt. Pt was able to have a detailed conversation with Danny. She stated she has been taking too much of her medicine and that she is unsure what is going on with her care. She stated Rosann Auerbach came to visit earlier but has not came back yet. That she is going to be admitted to the hospital.

## 2021-04-14 NOTE — ED Notes (Signed)
Dr. Earlean Polka at bedside.

## 2021-04-14 NOTE — H&P (Signed)
Lori [name] XLK:[id number] DOB: 1955-09-16 DOA: 04/18/2021    PCP: Alvester Chou, NP   Outpatient Specialists:  [facility] Dr.Skains  Patient arrived to ER on 04/20/2021 at 2009 Referred by Attending Charlesetta Shanks, MD   Patient coming from: home Lives alone,        Chief Complaint:   Chief Complaint  Patient presents with   Abdominal Pain   Fall    HPI: Lori [name] is a 65 y.o. 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  Has been on the floor since yesterday.  Patient stated that she just lowered herself to the ground but then could not get up.   Left leg weakness for weeks was supposed to get out pt MRI but got canceled due to insure Since was lying down her left arm was weak since she was laying down on  it Family reports some forgetfulness Has not been taking narcotics Last time was able to talk to her family was 4 PM last evening.  She has been having diarrhea and abdominal pain for the past 2 weeks. Had a emergency department visit on 5 December for lower extremity pain Dopplers of the legs were negative lumbar x-ray was negative and she was discharged home to follow-up with her primary care provider   CTA chest from September showed Aortic and coronary artery atherosclerosis  CTA neck Great vessel origins are patent. Approximately 40-50% stenosis of the left subclavian artery origin. On Monday her left foot was swollen Was seen her PCP and had blood work done For the past 2 wks unable to bear wait on the left Her legs have hurting very bad She keeps heaters running in the house    Per family she is confused at baseline She continues to smoke Have not been vaccinated for COVID or flu    Initial COVID TEST  NEGATIVE   Lab Results  Component Value Date    [place] 04/09/2021   [place] Not Detected 05/18/2020   [place] NEGATIVE 02/26/2020   [place] Not Detected 02/27/2019     Regarding pertinent Chronic problems:     Hyperlipidemia - on statins lipitor Lipid Panel     Component Value Date/Time   TRIG 106 09/11/2016 0403     HTN on lisinopril hydrochlorothiazide 20/12.5 mg     Chronic anemia - baseline hg Hemoglobin & Hematocrit  Recent Labs    04/11/21 1445 04/22/2021 2130 04/19/2021 2201  HGB 8.6* 12.0 11.9*  11.6*     While in ER: Found to have AKI and rhabdomyolysis metabolic acidosis elevated LFTs and troponin CT head abdomen and pelvis unremarkable    ED Triage Vitals  Enc Vitals Group     BP 04/10/2021 2015 113/86     Pulse Rate 05/07/2021 2015 (!) 112     Resp 04/07/2021 2015 (!) 30     Temp 05/04/2021 2015 98.7 F (37.1 C)     Temp src --      SpO2 04/12/2021 2015 100 %     Weight --      Height --      Head Circumference --      Peak Flow --      Pain Score 04/28/2021 2026 10     Pain Loc --      Pain Edu? --  Excl. in [place]? --   TMAX(24)@     _________________________________________ Significant initial  Findings: Abnormal Labs Reviewed  COMPREHENSIVE METABOLIC PANEL - Abnormal; Notable for the following components:      Result Value   Sodium 121 (*)    Potassium 5.8 (*)    Chloride 87 (*)    CO2 14 (*)    Glucose, Bld 191 (*)    BUN 41 (*)    Creatinine, Ser 3.42 (*)    Albumin 2.7 (*)    AST 1,579 (*)    ALT 504 (*)    Alkaline Phosphatase 273 (*)    GFR, Estimated 14 (*)    Anion gap 20 (*)    All other components within normal limits  LACTIC ACID, PLASMA - Abnormal; Notable for the following components:   Lactic Acid, Venous 6.0 (*)    All other components within normal limits  CBC WITH DIFFERENTIAL/PLATELET - Abnormal; Notable for the following components:   WBC 19.4 (*)    RBC 3.81 (*)    HCT 35.9 (*)    Platelets 674 (*)    Neutro Abs 17.4 (*)    Abs Immature  Granulocytes 0.10 (*)    All other components within normal limits  CK - Abnormal; Notable for the following components:   Total CK >50,000 (*)    All other components within normal limits  I-STAT CHEM 8, ED - Abnormal; Notable for the following components:   Sodium 119 (*)    Potassium 5.6 (*)    Chloride 92 (*)    BUN 46 (*)    Creatinine, Ser 3.40 (*)    Glucose, Bld 183 (*)    Calcium, Ion 1.03 (*)    TCO2 18 (*)    Hemoglobin 11.6 (*)    HCT 34.0 (*)    All other components within normal limits  I-STAT VENOUS BLOOD GAS, ED - Abnormal; Notable for the following components:   pH, Ven 7.245 (*)    pCO2, Ven 36.7 (*)    pO2, Ven 52.0 (*)    Bicarbonate 15.9 (*)    TCO2 17 (*)    Acid-base deficit 11.0 (*)    Sodium 120 (*)    Potassium 5.6 (*)    Calcium, Ion 1.04 (*)    HCT 35.0 (*)    Hemoglobin 11.9 (*)    All other components within normal limits  TROPONIN I (HIGH SENSITIVITY) - Abnormal; Notable for the following components:   Troponin I (High Sensitivity) 662 (*)    All other components within normal limits   ____________________________________________ Ordered CT HEAD   NON acute  CXR -  NON acute  CTabd/pelvis -  non-acute    _________________________ Troponin 662 ECG: Ordered Personally reviewed by me showing: HR : 101 Rhythm: Sinus tachycardia  no evidence of ischemic changes QTC 450 ____________________ This patient meets SIRS Criteria and may be septic.   The recent clinical data is shown below. Vitals:   04/12/2021 2135 04/18/2021 2200 04/20/2021 2312 04/29/2021 2345  BP: 92/72 (!) 121/93 106/62 118/84  Pulse: 99 99 100 (!) 102  Resp: (!) 23 (!) _0 Temp:      SpO2: 100% 100% 100% 100%   WBC     Component Value Date/Time   WBC 19.4 (H) 04/07/2021 2130   LYMPHSABS 1.1 04/16/2021 2130   MONOABS 0.6 04/11/2021 2130   EOSABS 0.1 05/07/2021 2130   BASOSABS 0.0 04/25/2021 2130   Lactic Acid, Venous  Component Value Date/Time    LATICACIDVEN 6.0 (HH) 04/28/2021 2130      Procalcitonin  Ordered   UA   ordered     Results for orders placed or performed during the hospital encounter of 05/05/2021  Resp Panel by RT-PCR (Flu A&B, Covid) Nasopharyngeal Swab     Status: None   Collection Time: 04/19/2021  8:57 PM   Specimen: Nasopharyngeal Swab; Nasopharyngeal(NP) swabs in vial transport medium  Result Value Ref Range Status   SARS Coronavirus 2 by RT PCR NEGATIVE NEGATIVE Final         Influenza A by PCR NEGATIVE NEGATIVE Final   Influenza B by PCR NEGATIVE NEGATIVE Final          _______________________________________________________ ER Provider Called:  Neurology    Dr.Xu They Recommend admit to medicine    SEEN in ER _______________________________________________ Hospitalist was called for admission for severe rhabdomyalisis  The following Work up has been ordered so far:  Orders Placed This Encounter  Procedures   Culture, blood (routine x 2)   Resp Panel by RT-PCR (Flu A&B, Covid) Nasopharyngeal Swab   DG Chest Port 1 View   CT Head Wo Contrast   CT CERVICAL SPINE WO CONTRAST   CT ABDOMEN PELVIS WO CONTRAST   Comprehensive metabolic panel   Lactic acid, plasma   Lipase, blood   CBC with Differential   Urinalysis, Routine w reflex microscopic   CK   Urine rapid drug screen (hosp performed)   Protime-INR   CK   Acetaminophen level   Hepatic function panel   Cardiac monitoring   Consult to neurology   Consult to intensivist   Consult to hospitalist   I-stat chem 8, ED   I-Stat venous blood gas, ED   EKG 12-Lead   Type and screen [place]    Following Medications were ordered in ER: Medications  lactated ringers infusion ( Intravenous New Bag/Given 05/01/2021 2143)  sodium chloride 0.9 % bolus 3,000 mL (has no administration in time range)  sodium bicarbonate injection 100 mEq (has no administration in time range)  fentaNYL (SUBLIMAZE) injection 25-50 mcg (has no  administration in time range)  HYDROmorphone (DILAUDID) injection 1 mg (1 mg Intravenous Given 04/24/2021 2105)  lactated ringers bolus 500 mL (500 mLs Intravenous New Bag/Given 04/14/2021 2142)        Consult Orders  (From admission, onward)           Start     Ordered   05/06/2021 2350  Consult to hospitalist  Paged by Jerene Pitch  Once       Provider:  (Not yet assigned)  Question Answer Comment  Place call to: [facility]   Reason for Consult Admit      04/24/2021 2349              OTHER Significant initial  Findings:  labs showing:    Recent Labs  Lab 04/11/21 1445 04/21/2021 2130 04/24/2021 2201  NA 121* 121* 120*  119*  K 4.8 5.8* 5.6*  5.6*  CO2 22 14*  --   GLUCOSE 109* 191* 183*  BUN 20 41* 46*  CREATININE 1.29* 3.42* 3.40*  CALCIUM 9.7 8.9  --     Cr    Up from baseline see below Lab Results  Component Value Date   CREATININE 3.40 (H) 04/11/2021   CREATININE 3.42 (H) 04/12/2021   CREATININE 1.29 (H) 04/11/2021    Recent Labs  Lab 04/09/2021 2130  AST  1,579*  ALT 504*  ALKPHOS 273*  BILITOT 0.8  PROT 7.4  ALBUMIN 2.7*   Lab Results  Component Value Date   CALCIUM 8.9 05/05/2021    Plt: Lab Results  Component Value Date   PLT 674 (H) 04/21/2021    COVID-19 Labs  No results for input(s): DDIMER, FERRITIN, LDH, CRP in the last 72 hours.  Lab Results  Component Value Date   SARSCOV2NAA NEGATIVE 04/08/2021   [place] Not Detected 05/18/2020   SARSCOV2NAA NEGATIVE 02/26/2020   [place] Not Detected 02/27/2019    Venous  Blood Gas result:  pH 7.245 pCO2 36.7  ABG    Component Value Date/Time   HCO3 15.9 (L) 04/07/2021 2201   TCO2 18 (L) 04/21/2021 2201   TCO2 17 (L) 04/10/2021 2201   ACIDBASEDEF 11.0 (H) 04/23/2021 2201   O2SAT 80.0 04/17/2021 2201       Recent Labs  Lab 04/11/21 1445 04/14/2021 2130 04/20/2021 2201  WBC 11.7* 19.4*  --   NEUTROABS 6.9 17.4*  --   HGB 8.6* 12.0 11.9*  11.6*  HCT 27.0* 35.9* 35.0*   34.0*  MCV 96.8 94.2  --   PLT 568* 674*  --     HG/HCT   stable,      Component Value Date/Time   HGB 11.6 (L) 05/07/2021 2201   HGB 11.9 (L) 04/23/2021 2201   HCT 34.0 (L) 05/02/2021 2201   HCT 35.0 (L) 04/16/2021 2201   MCV 94.2 04/09/2021 2130    Recent Labs  Lab 05/04/2021 2130  LIPASE 29   No results for input(s): AMMONIA in the last 168 hours.   Cardiac Panel (last 3 results) Recent Labs    04/25/2021 2130  CKTOTAL >50,000*      Cultures:    Component Value Date/Time   SDES BLOOD RIGHT ANTECUBITAL 02/26/2020 1608   SDES BLOOD LEFT ANTECUBITAL 02/26/2020 1608   SPECREQUEST  02/26/2020 1608    BOTTLES DRAWN AEROBIC AND ANAEROBIC Blood Culture adequate volume   SPECREQUEST  02/26/2020 1608    BOTTLES DRAWN AEROBIC AND ANAEROBIC Blood Culture adequate volume   CULT  02/26/2020 1608    NO GROWTH 5 DAYS Performed at [facility], [place] [address], [place], [place] [address]    CULT  02/26/2020 1608    NO GROWTH 5 DAYS Performed at [facility], [place] [address] [place], [place] [address]    REPTSTATUS 03/02/2020 FINAL 02/26/2020 1608   REPTSTATUS 03/02/2020 FINAL 02/26/2020 1608     Radiological Exams on Admission: CT ABDOMEN PELVIS WO CONTRAST  Result Date: 04/22/2021 CLINICAL DATA:  Fall 2 days ago with abdominal pain, initial encounter EXAM: CT ABDOMEN AND PELVIS WITHOUT CONTRAST TECHNIQUE: Multidetector CT imaging of the abdomen and pelvis was performed following the standard protocol without IV contrast. COMPARISON:  02/29/2020 FINDINGS: Lower chest: No acute abnormality. Hepatobiliary: No focal liver abnormality is seen. No gallstones, gallbladder wall thickening, or biliary dilatation. Pancreas: Unremarkable. No pancreatic ductal dilatation or surrounding inflammatory changes. Spleen: Normal in size without focal abnormality. Adrenals/Urinary Tract: Adrenal glands are within normal limits. Kidneys demonstrate no renal calculi or urinary tract  obstructive changes. A few cysts are noted within left kidney stable in appearance from the prior exam. The bladder is well distended. No ureteral stones are seen. Stomach/Bowel: Mild fecal material is noted within the colon consistent with a degree of constipation. No obstructive or inflammatory changes are noted. The appendix is within normal limits. Small bowel and stomach  are unremarkable. Vascular/Lymphatic: Aortic atherosclerosis. No enlarged abdominal or pelvic lymph nodes. Reproductive: Status post hysterectomy. No adnexal masses. Other: No abdominal wall hernia or abnormality. No abdominopelvic ascites. Musculoskeletal: Mild degenerative changes of lumbar spine are noted. IMPRESSION: Mild constipation. No findings to correspond with the given clinical history. Electronically Signed   By: Inez Catalina M.D.   On: 04/10/2021 23:03   CT Head Wo Contrast  Result Date: 04/19/2021 CLINICAL DATA:  Recent fall yesterday with headaches and neck pain, initial EN EXAM: CT HEAD WITHOUT CONTRAST CT CERVICAL SPINE WITHOUT CONTRAST TECHNIQUE: Multidetector CT imaging of the head and cervical spine was performed following the standard protocol without intravenous contrast. Multiplanar CT image reconstructions of the cervical spine were also generated. COMPARISON:  02/03/2021 FINDINGS: CT HEAD FINDINGS Brain: No evidence of acute infarction, hemorrhage, hydrocephalus, extra-axial collection or mass lesion/mass effect. 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 Vascular: No hyperdense vessel or unexpected calcification. Skull: Normal. Negative for fracture or focal lesion. Sinuses/Orbits: Mild mucosal changes are noted within the right sphenoid sinus. Other: None. CT CERVICAL SPINE FINDINGS Alignment: Within normal limits. Skull base and vertebrae: None cervical segments are well visualized. Disc space narrowing is noted at C5-6 and C6-7 with associated osteophytic changes. Mild  facet hypertrophic changes are noted. No acute fracture or acute facet abnormality is seen. Soft tissues and spinal canal: Surrounding soft tissue structures are within normal limits. Vascular calcifications are seen. Upper chest: Visualized lung apices are unremarkable. Other: None IMPRESSION: CT of the head: Mild mucosal changes in the right sphenoid sinus. No acute intracranial abnormality noted. CT of the cervical spine: Multilevel degenerative change without acute abnormality. Electronically Signed   By: Inez Catalina M.D.   On: 04/12/2021 23:00   CT CERVICAL SPINE WO CONTRAST  Result Date: 05/03/2021 CLINICAL DATA:  Recent fall yesterday with headaches and neck pain, initial EN EXAM: CT HEAD WITHOUT CONTRAST CT CERVICAL SPINE WITHOUT CONTRAST TECHNIQUE: Multidetector CT imaging of the head and cervical spine was performed following the standard protocol without intravenous contrast. Multiplanar CT image reconstructions of the cervical spine were also generated. COMPARISON:  02/03/2021 FINDINGS: CT HEAD FINDINGS Brain: No evidence of acute infarction, hemorrhage, hydrocephalus, extra-axial collection or mass lesion/mass effect. 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 Vascular: No hyperdense vessel or unexpected calcification. Skull: Normal. Negative for fracture or focal lesion. Sinuses/Orbits: Mild mucosal changes are noted within the right sphenoid sinus. Other: None. CT CERVICAL SPINE FINDINGS Alignment: Within normal limits. Skull base and vertebrae: None cervical segments are well visualized. Disc space narrowing is noted at C5-6 and C6-7 with associated osteophytic changes. Mild facet hypertrophic changes are noted. No acute fracture or acute facet abnormality is seen. Soft tissues and spinal canal: Surrounding soft tissue structures are within normal limits. Vascular calcifications are seen. Upper chest: Visualized lung apices are unremarkable. Other: None  IMPRESSION: CT of the head: Mild mucosal changes in the right sphenoid sinus. No acute intracranial abnormality noted. CT of the cervical spine: Multilevel degenerative change without acute abnormality. Electronically Signed   By: Inez Catalina M.D.   On: 04/29/2021 23:00   DG Chest Port 1 View  Result Date: 04/22/2021 CLINICAL DATA:  Abdominal pain.  Unwitnessed fall. EXAM: PORTABLE CHEST 1 VIEW COMPARISON:  10/29/2020. FINDINGS: The heart size and mediastinal contours are within normal limits. Mild atherosclerotic calcification of the aorta is noted. Both lungs are clear. No  acute osseous abnormality. IMPRESSION: No acute cardiopulmonary process. Electronically Signed   By: Brett Fairy M.D.   On: 04/08/2021 21:45   _______________________________________________________________________________________________________ Latest  Blood pressure 118/84, pulse (!) 102, temperature 98.7 F (37.1 C), resp. rate 16, SpO2 100 %.   Review of Systems:    Pertinent positives include:  fatigue,  Confusion localizing neurological complaints, left leg pain Constitutional:  No weight loss, night sweats, Fevers, chills, weight loss  HEENT:  No headaches, Difficulty swallowing,Tooth/dental problems,Sore throat,  No sneezing, itching, ear ache, nasal congestion, post nasal drip,  Cardio-vascular:  No chest pain, Orthopnea, PND, anasarca, dizziness, palpitations.no Bilateral lower extremity swelling  GI:  No heartburn, indigestion, abdominal pain, nausea, vomiting, diarrhea, change in bowel habits, loss of appetite, melena, blood in stool, hematemesis Resp:  no shortness of breath at rest. No dyspnea on exertion, No excess mucus, no productive cough, No non-productive cough, No coughing up of blood.No change in color of mucus.No wheezing. Skin:  no rash or lesions. No jaundice GU:  no dysuria, change in color of urine, no urgency or frequency. No straining to urinate.  No flank pain.  Musculoskeletal:  No  joint pain or no joint swelling. No decreased range of motion. No back pain.  Psych:  No change in mood or affect. No depression or anxiety. No memory loss.  Neuro: no no tingling, no weakness, no double vision, no gait abnormality, no slurred speech, no   All systems reviewed and apart from [place] all are negative _______________________________________________________________________________________________ Past Medical History:   Past Medical History:  Diagnosis Date   Arthritis    Depression    Depression with anxiety 2008-07-02   Patient had multiple death in her family over a short period of time. father, brother, uncle and niece who was stabbed 3 years ago. Patient's husband had a car accident last year and  need currently a feeding tube.    GERD (gastroesophageal reflux disease)    Gout    Headache(784.0)    migraines   History of cervical cancer  1985    status post partial hysterectomy , last Pap smear 10 years ago , no further followup   History of cocaine abuse ([place]) 10/23/2008    last documented in 07-02-2005 when admitted for hypertensive urgency   Hypertension 2005-07-02   Admitted for HTN crisis in 10/2008. Was given in 07/02/08 a wrong prescription from the pharmacy and per ED note it was Lisinopril-Hydrocholorthizide which gave her a  hives and feeling of sickness. Patient was started  on this meds on 05/31/2010 by Dr  Ihor Gully without any problem.    Insomnia    Tobacco abuse     35 years   Uterine cancer [facility])       Past Surgical History:  Procedure Laterality Date   BIOPSY  02/28/2020   Procedure: BIOPSY;  Surgeon: Doran Stabler, MD;  Location: [facility] ENDOSCOPY;  Service: Gastroenterology;;   [place]   COLONOSCOPY WITH PROPOFOL N/A 03/01/2017   Procedure: COLONOSCOPY WITH PROPOFOL;  Surgeon: Milus Banister, MD;  Location: [facility] ENDOSCOPY;  Service: Endoscopy;  Laterality: N/A;   ESOPHAGOGASTRODUODENOSCOPY (EGD) WITH PROPOFOL N/A 03/01/2017   Procedure:  ESOPHAGOGASTRODUODENOSCOPY (EGD) WITH PROPOFOL;  Surgeon: Milus Banister, MD;  Location: [facility] ENDOSCOPY;  Service: Endoscopy;  Laterality: N/A;   ESOPHAGOGASTRODUODENOSCOPY (EGD) WITH PROPOFOL N/A 02/28/2020   Procedure: ESOPHAGOGASTRODUODENOSCOPY (EGD) WITH PROPOFOL;  Surgeon: Doran Stabler, MD;  Location: [place];  Service: Gastroenterology;  Laterality: N/A;   PARTIAL HYSTERECTOMY     SKIN GRAFT      Social History:  Ambulatory not able to ambulate lately    reports that she has been smoking cigarettes. She has a 21.50 pack-year smoking history. She has never used smokeless tobacco. She reports that she does not drink alcohol and does not use drugs.     Family History:   Family History  Problem Relation Age of Onset   Thyroid disease Mother    Diabetes Father    Heart disease Father    Hyperlipidemia Father    Hypertension Father    Diabetes Brother    Hypertension Brother    Hyperlipidemia Brother    Colon polyps Brother    Thyroid disease Daughter    Obesity Daughter    Mental illness Daughter    Colon cancer Maternal Uncle    Stomach cancer Neg Hx    Pancreatic cancer Neg Hx    ______________________________________________________________________________________________ Allergies: No Known Allergies   Prior to Admission medications   Medication Sig Start Date End Date Taking? Authorizing Provider  acetaminophen (TYLENOL) 325 MG tablet Take 2 tablets (650 mg total) by mouth every 6 (six) hours as needed. 11/03/19   Darr, Edison Nasuti, PA-C  acetaminophen (TYLENOL) 500 MG tablet Take 500-1,000 mg by mouth every 8 (eight) hours as needed (for headaches).    [provider]  albuterol (VENTOLIN HFA) 108 (90 Base) MCG/ACT inhaler Inhale 1-2 puffs into the lungs every 6 (six) hours as needed for wheezing or shortness of breath. 11/03/19   Darr, Edison Nasuti, PA-C  ALPRAZolam Duanne Moron) 0.5 MG tablet Take 0.5-1 mg by mouth See admin instructions. Take 0.5-1 mg by mouth at  bedtime and an additional 0.5 mg up to two times a day as needed for anxiety    [provider]  aspirin EC 81 MG tablet Take 1 tablet (81 mg total) by mouth daily. Swallow whole. 02/10/21   Jerline Pain, MD  atorvastatin (LIPITOR) 40 MG tablet Take 1 tablet (40 mg total) by mouth daily. 02/10/21   Jerline Pain, MD  cyclobenzaprine (FLEXERIL) 10 MG tablet Take 10 mg by mouth 3 (three) times daily.    [provider]  gabapentin (NEURONTIN) 300 MG capsule TAKE 1 CAPSULE BY MOUTH EVERYDAY AT BEDTIME 11/09/17   Newt Minion, MD  gabapentin (NEURONTIN) 400 MG capsule Take 400 mg by mouth 4 (four) times daily. 01/17/21   [provider]  lisinopril-hydrochlorothiazide (ZESTORETIC) 20-12.5 MG tablet Take 1 tablet by mouth at bedtime. 12/27/20   [provider]  pantoprazole (PROTONIX) 40 MG tablet Take 1 tablet (40 mg total) by mouth 2 (two) times daily. 03/05/17 04/05/21  Milus Banister, MD  QUEtiapine (SEROQUEL) 100 MG tablet Take 100 mg by mouth in the morning and at bedtime.    [provider]  zolpidem (AMBIEN) 5 MG tablet Take 5 mg by mouth at bedtime. 01/25/21   [provider]    ___________________________________________________________________________________________________ Physical Exam: Vitals with BMI 04/28/2021 04/28/2021 04/29/2021  Height - - -  Weight - - -  BMI - - -  Systolic 992 426 834  Diastolic 84 62 93  Pulse 196 100 99   1. General:  in No  Acute distress   Chronically ill   -appearing 2. Psychological: Alert and   Oriented to self 3. Head/ENT:  Dry Mucous Membranes  Head Non traumatic, neck supple                           Poor Dentition 4. SKIN:  decreased Skin turgor,  Skin  mottled, multiple wounds                     5. Heart: Regular rate and rhythm no  Murmur, no Rub or gallop 6. Lungs:   no wheezes or crackles   7. Abdomen: Soft,  non-tender, Non distended    obese  bowel sounds present 8. Lower extremities: no clubbing, + cyanosis, no  edema 9. Neurologically  left side weakness 10. MSK: Normal range of motion    Chart has been reviewed  ______________________________________________________________________________________________  Assessment/Plan  65 y.o. 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   Admitted for rhabdomyolysis and aki  Present on Admission:  Rhabdomyolysis -aggressive IV fluid rehydration appreciate PCCM and nephrology consult   TOBACCO ABUSE -  - Spoke about importance of quitting spent 5 minutes discussing options for treatment, prior attempts at quitting, and dangers of smoking  -At this point patient is   interested in quitting  - nursing tobacco cessation protocol    Neuropathy of both feet -resume home medications when kidney function improves  Suspect claudication - order ABI   Hyponatremia -most likely secondary to severe dehydration we will rehydrate and follow sodium, obtain urine electrolytes  frequent labs    Essential hypertension -allow permissive hypertension   AKI (acute kidney injury) ([place])- likley due to dehydration, will rehydrate and follow renal function Nephrology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

## 2021-04-15 ENCOUNTER — Inpatient Hospital Stay (HOSPITAL_COMMUNITY): Payer: Medicaid Other

## 2021-04-15 DIAGNOSIS — N179 Acute kidney failure, unspecified: Secondary | ICD-10-CM | POA: Diagnosis not present

## 2021-04-15 DIAGNOSIS — M6282 Rhabdomyolysis: Secondary | ICD-10-CM | POA: Diagnosis not present

## 2021-04-15 DIAGNOSIS — Z515 Encounter for palliative care: Secondary | ICD-10-CM

## 2021-04-15 LAB — UREA NITROGEN, URINE: Urea Nitrogen, Ur: 250 mg/dL

## 2021-04-15 LAB — GLUCOSE, CAPILLARY: Glucose-Capillary: 43 mg/dL — CL (ref 70–99)

## 2021-04-15 MED ORDER — LACTATED RINGERS IV BOLUS
250.0000 mL | Freq: Once | INTRAVENOUS | Status: AC
  Administered 2021-04-15: 250 mL via INTRAVENOUS

## 2021-04-15 MED ORDER — DEXTROSE 50 % IV SOLN
INTRAVENOUS | Status: AC
  Filled 2021-04-15: qty 50

## 2021-04-15 MED ORDER — FUROSEMIDE 10 MG/ML IJ SOLN
INTRAMUSCULAR | Status: AC
  Filled 2021-04-15: qty 4

## 2021-04-15 MED ORDER — DEXTROSE 50 % IV SOLN
INTRAVENOUS | Status: AC
  Filled 2021-04-15: qty 100

## 2021-04-18 MED FILL — Medication: Qty: 1 | Status: AC

## 2021-04-19 LAB — CULTURE, BLOOD (ROUTINE X 2)
Culture: NO GROWTH
Culture: NO GROWTH
Special Requests: ADEQUATE
Special Requests: ADEQUATE

## 2021-05-08 NOTE — Progress Notes (Signed)
[name] (Sister) contacted.  She will not be coming to the hospital because she has the flu.  Please keep her updated on progress.

## 2021-05-08 NOTE — Final Progress Note (Signed)
Pt. was found unresponsive at around 0300 during hourly rounds. Called code blue and notified Dr Olena Heckle MD.  Emergency team arrived and supported till termination at 6. Family has been been notified.

## 2021-05-08 NOTE — Progress Notes (Signed)
Called pt son Olivia Mackie to confirm name and address of funeral home but family do not have one yet. They will decide later on today.

## 2021-05-08 NOTE — Progress Notes (Signed)
Date and time results received: 12/23/202212/08/22 2031  Test: Potassium Critical Value: 6.7  Name of Provider Notified: Gershon Cull NP  Orders Received? New orders Or Actions Take: Administered medication as ordered and continue to observe pt.

## 2021-05-08 NOTE — Death Summary Note (Signed)
[facility] Death Note                                                                                                                                                                                               Lori [name], is a 66 y.o. female, DOB - Jul 16, 1955, IAX:[id number]  Admit date - 04/11/2021   Admitting Physician Toy Baker, MD  Outpatient Primary MD for the patient is Alvester Chou, NP  LOS - 1  Chief Complaint  Patient presents with   Abdominal Pain   Fall       Notification: Alvester Chou, NP notified of death of 04-22-21   Date and Time of Death - 2021-04-22 @ 3.34 am  Pronounced by - Code team  History of present illness:   Lori [name] is a 66 y.o. female with a history of - 66 y.o. female with medical history significant of cervical cancer, tobacco abuse, depression, anxiety, HTN, GERD, chronic leg pain chronic neuropathy, hyponatremia, subclavian artery stenosis on the left who was brought to the ER 36 hrs ago after a fall - on the floor ++ hrs, abd pain, diarrhea, in the ER severe Hyponatremia, AKI, SOB and admitted to the hospital, subsequently her blood work started suggesting hyperkalemia and her urine drug screen later came back positive for cocaine abuse and exam was suggested of bladder obstruction needing Foley catheter placement.  She was being seen by pulmonary critical care, neurology, nephrology along with hospitalist team, she was developing moderate hyperkalemia which was appropriately treated with Lokelma, Kayexalate, bicarb, Lasix along with other medications, nephrology was closely following her overnight.  Unfortunately around 3 AM on 22-Apr-2021 patient became asystolic and despite best attempts of resuscitation by the code team she passed away.   Final Diagnoses:  Cause if death - Asystole, AKI, Cocaine abuse  Signature  [name] M.D on  April 22, 2021 at 7:20 AM  [facility]  Office Phone [phone]  Total clinical and documentation time for today Under 30 minutes   Last Note                                                                     PROGRESS NOTE  Patient Demographics:    Lori [name], is a 66 y.o. female, DOB - 12-14-1955, IHK:[id number]  Outpatient Primary MD for the patient is Alvester Chou, NP    LOS - 1  Admit date - 05/01/2021    Chief Complaint  Patient presents with   Abdominal Pain   Fall       Brief Narrative (HPI from H&P)     66 y.o. 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   Subjective:    Lori [name] today has, No headache, No chest pain, +ve abd pain and SOB.   Assessment  & Plan :     AKI, fall related rhabdomyolysis, abdominal pain and shortness of breath - she evidently has severe rhabdomyolysis causing her AKI, also had preceding diarrhea with dehydration and hyponatremia.  Upon physical exam patient has massively distended bladder almost above her navel, Foley catheter has been placed with several liters of urine return which appears quite dark, for now continue IV fluids however due to severe hyponatremia will involve nephrology.  Continue to monitor with supportive care, shortness of breath is considerably improved after Foley catheter placement.  Is no sepsis or infection stop antibiotics   2.  Severe hyponatremia.  Kindly see #1 above, serum osmolality along with urine electrolytes  ordered.  Nephrology on board, clinically appears dehydrated.  3.  Urinary tract outflow traction.  Foley and Flomax.  4.  Metabolic acidosis with respiratory alkalosis.  Bicarb has dropped due to respiratory alkalosis which is happening due to severe distress from bladder distention, also has some element of AKI causing metabolic acidosis.  Supportive care.  Monitor.  No further bicarb administration.  5.  Ongoing lower extremity discomfort.  For now appears to be secondary to fall and prolonged downtime, once her acute issues have resolved will reevaluate, CT head and MRI brain although limited but nonacute, neurology on board.  6.  Underlying dementia.  Supportive care at risk for delirium.  Avoid narcotics and benzodiazepines.  7.  Transaminitis.  Due to severe rhabdomyolysis.  Monitor.  8.  Hypertension.  Due to distress from bladder distention.  Supportive care.  9.  Tobacco abuse.  Counseled to quit  10.  Peripheral neuropathy.  For now monitor.  11.  Hyperkalemia due to AKI.  Lokelma.  Monitor.  12.  Mildly elevated troponin in non-ACS pattern.  Could be due to demand mismatch caused by severe respiratory distress and tachycardia along with hypotension due to abdominal distress, trend is flat, no chest pain, check echocardiogram to evaluate EF for motion. Trend is downtrending.      Condition - Extremely Guarded  Family Communication  :  Rosann Auerbach (niece) [phone] - 04/14/21  Code Status :  Full  Consults  :  Renal, PCCM, Neuro  PUD Prophylaxis : PPI   Procedures  :     Echocardiogram.    MRI/MRA brain.  Limited study but no acute findings.    CT head and C-spine.  Nonacute  Lower extremity venous duplex.  Nonacute      Disposition Plan  :    Status is: Inpatient  Remains inpatient appropriate because: AKI.   DVT Prophylaxis  :  Heparin added  Lab Results  Component Value Date   PLT 565 (H) 04/14/2021    Diet :  Diet Order             Diet Heart  Room service appropriate? Yes; Fluid consistency: Thin  Diet effective now                    Inpatient Medications  Scheduled Meds:  aspirin EC  81 mg Oral Daily   Chlorhexidine Gluconate Cloth  6 each Topical Daily   dextrose       dextrose       furosemide       heparin injection (subcutaneous)  5,000 Units Subcutaneous Q8H   pantoprazole  40 mg Oral BID   sodium bicarbonate  650 mg Oral TID   tamsulosin  0.4 mg Oral Daily   thiamine injection  100 mg Intravenous Daily   Continuous Infusions:  lactated ringers 75 mL/hr at 04/14/21 2107   PRN Meds:.acetaminophen, heparin sodium (porcine), hydrALAZINE, lidocaine, liver oil-zinc oxide, ondansetron (ZOFRAN) IV  Antibiotics  :    Anti-infectives (From admission, onward)    Start     Dose/Rate Route Frequency Ordered Stop   04/14/21 1600  doxycycline (VIBRAMYCIN) 100 mg in sodium chloride 0.9 % 250 mL IVPB  Status:  Discontinued        100 mg 125 mL/hr over 120 Minutes Intravenous Every 12 hours 04/14/21 0232 04/14/21 0952   04/14/21 0245  doxycycline (VIBRAMYCIN) 100 mg in sodium chloride 0.9 % 250 mL IVPB        100 mg 125 mL/hr over 120 Minutes Intravenous  Once 04/14/21 0234 04/14/21 6222        Time Spent in minutes  30   [name] M.D on 2021-04-25 at 7:21 AM  To page go to [email or web address]   [facility] -  Office  [phone]  See all Orders from today for further details    Objective:   Vitals:   04/14/21 2047 04/14/21 2200 04-25-2021 0000 2021/04/25 0400  BP: (!) 121/59 117/65    Pulse: (!) 126 (!) 129    Resp: 18 19    Temp: 98.2 F (36.8 C)  99.8 F (37.7 C)   TempSrc: Oral  Oral   SpO2: 96% 95%    Height:    5\' 3"  (1.6 m)    Wt Readings from Last 3 Encounters:  04/11/21 72.6 kg  02/10/21 75.8 kg  02/03/21 77.1 kg     Intake/Output Summary (Last 24 hours) at 2021-04-25 1740 Last data filed at 04/14/2021 1739 Gross per 24 hour  Intake --  Output 700 ml  Net -700 ml      Physical Exam  Awake Alert, No new F.N deficits, in severe distress due to abdominal pain and bladder distention. [place].AT,PERRAL Supple Neck, No JVD,   Symmetrical Chest wall movement, Good air movement bilaterally, CTAB RRR,No Gallops,Rubs or new Murmurs,  +ve B.Sounds, Abd has severe suprapubic fullness and tenderness on palpation No Cyanosis, multiple lower extremity wounds kindly see wound care note for details.   Data Review:    CBC Recent Labs  Lab 04/11/21 1445 04/09/2021 2130 05/01/2021 2201 04/14/21 0251 04/14/21 0824  WBC 11.7* 19.4*  --  9.6  --   HGB 8.6* 12.0 11.9*  11.6* 11.4* 11.9*  HCT 27.0* 35.9* 35.0*  34.0* 34.0* 35.0*  PLT 568* 674*  --  565*  --   MCV 96.8 94.2  --  92.4  --   MCH 30.8  31.5  --  31.0  --   MCHC 31.9 33.4  --  33.5  --   RDW 14.5 13.9  --  14.1  --   LYMPHSABS 3.6 1.1  --  2.0  --   MONOABS 1.0 0.6  --  0.2  --   EOSABS 0.1 0.1  --  0.0  --   BASOSABS 0.1 0.0  --  0.0  --     Electrolytes Recent Labs  Lab 04/19/2021 2130 04/18/2021 2201 04/14/21 0100 04/14/21 0140 04/14/21 0251 04/14/21 0551 04/14/21 0810 04/14/21 0824 04/14/21 1200 04/14/21 1904  NA 121* 120*  119*  --   --  126*  --  127* 126* 129* 130*  K 5.8* 5.6*  5.6*  --   --  6.0*  --  5.4* 5.2* 6.5* 6.7*  CL 87* 92*  --   --  96*  --  96*  --  95* 97*  CO2 14*  --   --   --  21*  --  16*  --  17* 18*  GLUCOSE 191* 183*  --   --  158*  --  190*  --  127* 139*  BUN 41* 46*  --   --  42*  --  47*  --  49* 53*  CREATININE 3.42* 3.40*  --   --  2.96*  --  3.35*  --  3.45* 3.71*  CALCIUM 8.9  --   --   --  7.2*  --  9.9  --  8.0* 7.6*  AST 1,579*  --   --   --   --  1,844* 1,647*  --   --   --   ALT 504*  --   --   --   --  575* 513*  --   --   --   ALKPHOS 273*  --   --   --   --  269* 261*  --   --   --   BILITOT 0.8  --   --   --   --  0.5 0.6  --   --   --   ALBUMIN 2.7*  --   --   --   --  1.9* 1.9*  --  2.0*  --   MG  --   --   --   --  2.1 2.0  --   --   --    --   CRP  --   --  21.5*  --   --   --   --   --   --   --   PROCALCITON  --   --   --  9.20  --   --   --   --   --   --   LATICACIDVEN 6.0*  --   --  3.2* 2.2*  --   --   --   --   --   INR  --   --   --  1.6*  --   --   --   --   --   --   TSH  --   --   --  0.807  --   --   --   --   --   --   HGBA1C  --   --   --   --   --   --  6.4*  --   --   --  AMMONIA  --   --   --  28  --   --   --   --   --   --   BNP  --   --   --   --   --   --  380.2*  --   --   --     ------------------------------------------------------------------------------------------------------------------ Recent Labs    04/14/21 0551  CHOL 108  HDL 34*  LDLCALC 57  TRIG 87  CHOLHDL 3.2    Lab Results  Component Value Date   HGBA1C 6.4 (H) 04/14/2021    Recent Labs    04/14/21 0140  TSH 0.807   ------------------------------------------------------------------------------------------------------------------ ID Labs Recent Labs  Lab 04/11/21 1445 04/18/2021 2130 04/17/2021 2201 04/14/21 0100 04/14/21 0140 04/14/21 0251 04/14/21 0810 04/14/21 1200 04/14/21 1904  WBC 11.7* 19.4*  --   --   --  9.6  --   --   --   PLT 568* 674*  --   --   --  565*  --   --   --   CRP  --   --   --  21.5*  --   --   --   --   --   PROCALCITON  --   --   --   --  9.20  --   --   --   --   LATICACIDVEN  --  6.0*  --   --  3.2* 2.2*  --   --   --   CREATININE 1.29* 3.42* 3.40*  --   --  2.96* 3.35* 3.45* 3.71*   Cardiac Enzymes No results for input(s): CKMB, TROPONINI, MYOGLOBIN in the last 168 hours.  Invalid input(s): CK   Radiology Reports CT ABDOMEN PELVIS WO CONTRAST  Result Date: 04/27/2021 CLINICAL DATA:  Fall 2 days ago with abdominal pain, initial encounter EXAM: CT ABDOMEN AND PELVIS WITHOUT CONTRAST TECHNIQUE: Multidetector CT imaging of the abdomen and pelvis was performed following the standard protocol without IV contrast. COMPARISON:  02/29/2020 FINDINGS: Lower chest: No acute abnormality.  Hepatobiliary: No focal liver abnormality is seen. No gallstones, gallbladder wall thickening, or biliary dilatation. Pancreas: Unremarkable. No pancreatic ductal dilatation or surrounding inflammatory changes. Spleen: Normal in size without focal abnormality. Adrenals/Urinary Tract: Adrenal glands are within normal limits. Kidneys demonstrate no renal calculi or urinary tract obstructive changes. A few cysts are noted within left kidney stable in appearance from the prior exam. The bladder is well distended. No ureteral stones are seen. Stomach/Bowel: Mild fecal material is noted within the colon consistent with a degree of constipation. No obstructive or inflammatory changes are noted. The appendix is within normal limits. Small bowel and stomach are unremarkable. Vascular/Lymphatic: Aortic atherosclerosis. No enlarged abdominal or pelvic lymph nodes. Reproductive: Status post hysterectomy. No adnexal masses. Other: No abdominal wall hernia or abnormality. No abdominopelvic ascites. Musculoskeletal: Mild degenerative changes of lumbar spine are noted. IMPRESSION: Mild constipation. No findings to correspond with the given clinical history. Electronically Signed   By: Inez Catalina M.D.   On: 04/22/2021 23:03   DG Lumbar Spine Complete  Result Date: 04/11/2021 CLINICAL DATA:  Severe pain going into left leg, worse over the last 2 months. Chronic back pain. EXAM: LUMBAR SPINE - COMPLETE 4+ VIEW COMPARISON:  Lumbar spine radiographs 11/18/2019. CT 04/12/2020. FINDINGS: There are 5 lumbar type vertebral bodies demonstrating a minimal convex right scoliosis centered at L1. At L4-5, there is a degenerative grade 1 anterolisthesis. Chronic disc  space narrowing and endplate osteophytes at L5-S1. There are mild facet degenerative changes inferiorly. No evidence of acute fracture or pars defect. Aortoiliac atherosclerosis noted. IMPRESSION: Stable lumbar spondylosis and degenerative grade 1 anterolisthesis at L4-5. No  acute osseous findings. Electronically Signed   By: Richardean Sale M.D.   On: 04/11/2021 15:48   CT HEAD WO CONTRAST (5MM)  Result Date: 03-May-2021 CLINICAL DATA:  Altered mental status EXAM: CT HEAD WITHOUT CONTRAST TECHNIQUE: Contiguous axial images were obtained from the base of the skull through the vertex without intravenous contrast. 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 No findings to suggest acute hemorrhage, acute infarction or space-occupying mass lesion are noted. Vascular: No hyperdense vessel or unexpected calcification. Skull: Normal. Negative for fracture or focal lesion. Sinuses/Orbits: Minimal fluid is noted within the sphenoid sinus. Other: None. IMPRESSION: Chronic ischemic changes stable from the prior exam. No acute abnormality is noted. Electronically Signed   By: Inez Catalina M.D.   On: 05-03-2021 01:36   CT Head Wo Contrast  Result Date: 04/24/2021 CLINICAL DATA:  Recent fall yesterday with headaches and neck pain, initial EN EXAM: CT HEAD WITHOUT CONTRAST CT CERVICAL SPINE WITHOUT CONTRAST TECHNIQUE: Multidetector CT imaging of the head and cervical spine was performed following the standard protocol without intravenous contrast. Multiplanar CT image reconstructions of the cervical spine were also generated. COMPARISON:  02/03/2021 FINDINGS: CT HEAD FINDINGS Brain: No evidence of acute infarction, hemorrhage, hydrocephalus, extra-axial collection or mass lesion/mass effect. 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 Vascular: No hyperdense vessel or unexpected calcification. Skull: Normal. Negative for fracture or focal lesion. Sinuses/Orbits: Mild mucosal changes are noted within the right sphenoid sinus. Other: None. CT CERVICAL SPINE FINDINGS Alignment: Within normal limits. Skull base and vertebrae: None cervical segments are well visualized. Disc space narrowing is  noted at C5-6 and C6-7 with associated osteophytic changes. Mild facet hypertrophic changes are noted. No acute fracture or acute facet abnormality is seen. Soft tissues and spinal canal: Surrounding soft tissue structures are within normal limits. Vascular calcifications are seen. Upper chest: Visualized lung apices are unremarkable. Other: None IMPRESSION: CT of the head: Mild mucosal changes in the right sphenoid sinus. No acute intracranial abnormality noted. CT of the cervical spine: Multilevel degenerative change without acute abnormality. Electronically Signed   By: Inez Catalina M.D.   On: 04/09/2021 23:00   CT CERVICAL SPINE WO CONTRAST  Result Date: 04/26/2021 CLINICAL DATA:  Recent fall yesterday with headaches and neck pain, initial EN EXAM: CT HEAD WITHOUT CONTRAST CT CERVICAL SPINE WITHOUT CONTRAST TECHNIQUE: Multidetector CT imaging of the head and cervical spine was performed following the standard protocol without intravenous contrast. Multiplanar CT image reconstructions of the cervical spine were also generated. COMPARISON:  02/03/2021 FINDINGS: CT HEAD FINDINGS Brain: No evidence of acute infarction, hemorrhage, hydrocephalus, extra-axial collection or mass lesion/mass effect. 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 Vascular: No hyperdense vessel or unexpected calcification. Skull: Normal. Negative for fracture or focal lesion. Sinuses/Orbits: Mild mucosal changes are noted within the right sphenoid sinus. Other: None. CT CERVICAL SPINE FINDINGS Alignment: Within normal limits. Skull base and vertebrae: None cervical segments are well visualized. Disc space narrowing is noted at C5-6 and C6-7 with associated osteophytic changes. Mild facet hypertrophic changes are noted. No acute fracture or acute facet abnormality is seen. Soft tissues and spinal canal: Surrounding soft  tissue structures are within normal limits. Vascular calcifications are seen.  Upper chest: Visualized lung apices are unremarkable. Other: None IMPRESSION: CT of the head: Mild mucosal changes in the right sphenoid sinus. No acute intracranial abnormality noted. CT of the cervical spine: Multilevel degenerative change without acute abnormality. Electronically Signed   By: Inez Catalina M.D.   On: 04/09/2021 23:00   MR ANGIO HEAD WO CONTRAST  Result Date: 04/14/2021 CLINICAL DATA:  66 year old female with fall. Pain. Headaches. Left lower extremity weakness. Confused. EXAM: MRA HEAD WITHOUT CONTRAST TECHNIQUE: Angiographic images of the Circle of Willis were acquired using MRA technique without intravenous contrast. COMPARISON:  Brain MRI and neck MRA today. FINDINGS: Time-of-flight vessel signal is substantially degraded by metallic susceptibility artifact emanating from the left face. Subsequently, intracranial MRA of the anterior circulation is largely nondiagnostic. However, antegrade flow signal is detected in the distal vertebral arteries and basilar. And the right PICA origin, left AICA, SCA, and PCA origins also appear patent. IMPRESSION: Largely nondiagnostic intracranial MRA due to left face associated metal susceptibility artifact. Patency of the posterior circulation is evident. Electronically Signed   By: Genevie Ann M.D.   On: 04/14/2021 06:56   MR ANGIO NECK WO CONTRAST  Result Date: 04/14/2021 CLINICAL DATA:  66 year old female with fall. Pain. Headaches. Left lower extremity weakness. Confused. EXAM: MRA NECK WITHOUT CONTRAST TECHNIQUE: Angiographic images of the neck were acquired using MRA technique without intravenous contrast. Carotid stenosis measurements (when applicable) are obtained utilizing NASCET criteria, using the distal internal carotid diameter as the denominator. COMPARISON:  Brain and cervical spine MRI today. CTA head and neck 02/03/2021. FINDINGS: Noncontrast time-of-flight neck MRA imaging. 3 vessel arch configuration demonstrated. Antegrade flow signal  in the bilateral cervical carotid and vertebral arteries which appears to continue to the skull base. Tortuosity of the right CCA in the lower neck at or just below the level of the thyroid (series 14, image 109), also demonstrated on the September CTA. But questionable associated hemodynamically significant stenosis there based on loss of MRA flow signal today. Streak artifact there from right subclavian venous contrast in September, but possible 60% stenosis by CTA. Right carotid bifurcation and visible cervical right ICA appear within normal limits. Mildly tortuous proximal left CCA. Left carotid bifurcation and visible cervical left ICA appear within normal limits. Patent proximal subclavian arteries and vertebral artery origins. Mildly dominant appearing left vertebral artery with no evidence of hemodynamically significant stenosis in the neck. IMPRESSION: 1. Cannot exclude hemodynamically significant stenosis of the Right CCA at the thoracic inlet, where tortuosity limited vessel detail on the September CTA. 2. But no other evidence of cervical carotid or vertebral artery hemodynamically significant stenosis. Electronically Signed   By: Genevie Ann M.D.   On: 04/14/2021 06:53   MR BRAIN WO CONTRAST  Result Date: 04/14/2021 CLINICAL DATA:  66 year old female with fall. Pain. Headaches. Left lower extremity weakness. Confused. EXAM: MRI HEAD WITHOUT CONTRAST TECHNIQUE: Multiplanar, multiecho pulse sequences of the brain and surrounding structures were obtained without intravenous contrast. COMPARISON:  Head CT 05/03/2021 and earlier. FINDINGS: Brain: Susceptibility artifact related to retained round metallic foreign body in the left buccal space seen by CT. This degrades some portions of the exam, especially DWI and SWI. No restricted diffusion identified. No midline shift, mass effect, evidence of mass lesion, ventriculomegaly, extra-axial collection or acute intracranial hemorrhage. Cervicomedullary junction  and pituitary are within normal limits. Subcortical oval encephalomalacia in the left frontal operculum. Mild regional white matter T2 and  FLAIR hyperintensity. No cortical encephalomalacia identified. Other white matter signal appears normal for age. Deep gray matter nuclei, brainstem and cerebellum appear negative. Vascular: Major intracranial vascular flow voids are preserved. Skull and upper cervical spine: Dedicated cervical spine MRI reported separately. Negative visible cervical spine. Visualized bone marrow signal is within normal limits. Sinuses/Orbits: Left orbit and paranasal sinuses partially obscured by susceptibility artifact. Negative right orbit. Small volume layering fluid in the sphenoid sinuses where bubbly opacity was noted recently. Other: Mastoids are clear. Visible internal auditory structures appear normal. IMPRESSION: 1. Suboptimal exam due to left face metallic foreign body susceptibility artifact, which significantly affects DWI and SWI. 2. No acute intracranial abnormality identified. 3. Small area of chronic encephalomalacia at the left frontal operculum. Electronically Signed   By: Genevie Ann M.D.   On: 04/14/2021 06:43   MR CERVICAL SPINE WO CONTRAST  Result Date: 04/14/2021 CLINICAL DATA:  66 year old female with fall. Pain. Headaches. Left lower extremity weakness. Confused. EXAM: MRI CERVICAL SPINE WITHOUT CONTRAST TECHNIQUE: Multiplanar, multisequence MR imaging of the cervical spine was performed. No intravenous contrast was administered. COMPARISON:  Brain MRI today reported separately. CTA neck 02/03/2021. FINDINGS: Alignment: Increased cervical lordosis compared to the September CTA. No spondylolisthesis. Vertebrae: Mild susceptibility artifact at the skull base related to left face metallic foreign body. No convincing marrow edema or evidence of acute osseous abnormality. Normal background bone marrow signal. Cord: Capacious spinal canal at most levels. No spinal cord signal  abnormality identified despite up to mild degenerative cord mass effect at C5 and C6, below. Negative visible upper thoracic spinal canal and spinal cord. Posterior Fossa, vertebral arteries, paraspinal tissues: Cervicomedullary junction is within normal limits. Brain is detailed separately. Preserved major vascular flow voids in the neck. Stable subcentimeter right thyroid STIR hyperintense nodule Not clinically significant; no follow-up imaging recommended (ref: J Am Coll Radiol. 2015 Feb;12(2): 143-50).Negative other visible neck soft tissues, lung apices. Disc levels: C2-C3:  Negative. C3-C4: Small central disc protrusion (series 12, image 22). Mild facet hypertrophy. No associated stenosis. C4-C5:  Negative disc.  Mild facet hypertrophy.  No stenosis. C5-C6: Disc space loss. Circumferential disc osteophyte complex with broad-based posterior component. Mild facet and ligament flavum hypertrophy. Foraminal stenosis greater on the right. Mild spinal stenosis with up to mild spinal cord mass effect. Mild left but moderate to severe right C6 foraminal stenosis. C6-C7: Disc space loss. Circumferential disc bulge with mild endplate spurring. Mild facet and ligament flavum hypertrophy. Mild spinal stenosis. No cord mass effect. Mild C7 foraminal stenosis. C7-T1: Mild to moderate facet hypertrophy greater on the left. No stenosis. 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 Up to mild spinal cord mass effect at the former, but no cord signal abnormality identified. Associated moderate to severe right C6 neural foraminal stenosis. Electronically Signed   By: Genevie Ann M.D.   On: 04/14/2021 06:49   IR Fluoro Guide CV Line Right  Result Date: 04/14/2021 Criselda Peaches, MD     04/14/2021  5:40 PM Interventional Radiology Procedure Note Procedure: Placement of a 19cm trialysis-type catheter via right IJ.  Tip in RA and ready for use.  Complications: None Estimated Blood Loss: None Recommendations: - Routine line care Signed, Criselda Peaches, MD   DG CHEST PORT 1 VIEW  Result Date: 04/14/2021 CLINICAL DATA:  66 year old female with history of shortness of breath. EXAM: PORTABLE CHEST 1 VIEW COMPARISON:  Chest x-ray 05/06/2021. FINDINGS: Lung volumes are  low. No consolidative airspace disease. No pleural effusions. No pneumothorax. No pulmonary nodule or mass noted. Pulmonary vasculature and the cardiomediastinal silhouette are within normal limits. Atherosclerosis in the thoracic aorta. IMPRESSION: 1. Low lung volumes without radiographic evidence of acute cardiopulmonary disease. 2. Aortic atherosclerosis Electronically Signed   By: Vinnie Langton M.D.   On: 04/14/2021 06:38   DG Chest Port 1 View  Result Date: 05/04/2021 CLINICAL DATA:  Abdominal pain.  Unwitnessed fall. EXAM: PORTABLE CHEST 1 VIEW COMPARISON:  10/29/2020. FINDINGS: The heart size and mediastinal contours are within normal limits. Mild atherosclerotic calcification of the aorta is noted. Both lungs are clear. No acute osseous abnormality. IMPRESSION: No acute cardiopulmonary process. Electronically Signed   By: Brett Fairy M.D.   On: 04/08/2021 21:45   VAS [place] ABI WITH/WO TBI  Result Date: 04/14/2021  LOWER EXTREMITY DOPPLER STUDY Patient Name:  Avonna Iribe  Date of Exam:   04/14/2021 Medical Rec #: [id number]             Accession #:    [id number] Date of Birth: 08-30-1955              Patient Gender: F Patient Age:   62 years Exam Location:  [facility] Procedure:      VAS [place] ABI WITH/WO TBI Referring Phys: Nyoka Lint DOUTOVA --------------------------------------------------------------------------------  Indications: Claudication.  Limitations: Today's exam was limited due to patient positioning, patient unable              to lie flat and involuntary patient movement. Comparison Study: no prior Performing Technologist: Music therapist  Examination Guidelines: A complete evaluation includes at minimum, Doppler waveform signals and systolic blood pressure reading at the level of bilateral brachial, anterior tibial, and posterior tibial arteries, when vessel segments are accessible. Bilateral testing is considered an integral part of a complete examination. Photoelectric Plethysmograph (PPG) waveforms and toe systolic pressure readings are included as required and additional duplex testing as needed. Limited examinations for reoccurring indications may be performed as noted.  ABI Findings: +---------+------------------+-----+---------+---------------------------------+ Right    Rt Pressure (mmHg)IndexWaveform Comment                           +---------+------------------+-----+---------+---------------------------------+ Brachial                        triphasicunable to optain pressure due to                                           IV location                       +---------+------------------+-----+---------+---------------------------------+ PTA      92                0.76 biphasic                                   +---------+------------------+-----+---------+---------------------------------+ DP       97                0.80 biphasic                                   +---------+------------------+-----+---------+---------------------------------+  Great Toe                                unable to obtain pressure and                                              waveform due to patient movement  +---------+------------------+-----+---------+---------------------------------+ +---------+------------------+-----+---------+---------------------------------+ Left     Lt Pressure (mmHg)IndexWaveform Comment                           +---------+------------------+-----+---------+---------------------------------+ Brachial 121                    triphasic                                   +---------+------------------+-----+---------+---------------------------------+ PTA      74                0.61 biphasic                                   +---------+------------------+-----+---------+---------------------------------+ DP       75                0.62 biphasic                                   +---------+------------------+-----+---------+---------------------------------+ Great Toe                                unable to obtain pressure and                                              waveform due to patient movement  +---------+------------------+-----+---------+---------------------------------+ +-------+-----------+-----------+------------+------------+ ABI/TBIToday's ABIToday's TBIPrevious ABIPrevious TBI +-------+-----------+-----------+------------+------------+ Right  0.80                                           +-------+-----------+-----------+------------+------------+ Left   0.62                                           +-------+-----------+-----------+------------+------------+  Summary: Right: Resting right ankle-brachial index indicates mild right lower extremity arterial disease. Left: Resting left ankle-brachial index indicates moderate left lower extremity arterial disease.  *See table(s) above for measurements and observations.  Electronically signed by Orlie Pollen on 04/14/2021 at 5:05:39 PM.    Final    ECHOCARDIOGRAM COMPLETE  Result Date: 04/14/2021    ECHOCARDIOGRAM REPORT   Patient Name:   HOUA NIE Date of Exam: 04/14/2021 Medical Rec #:  [id number]            Height:       63.0 in Accession #:    [id number]  Weight:       160.0 lb Date of Birth:  October 17, 1955             BSA:          1.759 m Patient Age:    4 years             BP:           141/66 mmHg Patient Gender: F                    HR:           121 bpm. Exam Location:  Inpatient Procedure: 2D Echo, Cardiac Doppler, Color Doppler and Intracardiac             Opacification Agent Indications:    R07.9* Chest pain, unspecified. Elevated troponin.  History:        Patient has no prior history of Echocardiogram examinations.                 Risk Factors:Current Smoker and Hypertension. Rhabdomyolysis.                 Cancer.  Sonographer:    Roseanna Rainbow RDCS Referring Phys: [place]  Sonographer Comments: Technically difficult study due to poor echo windows, suboptimal parasternal window, suboptimal apical window and suboptimal subcostal window. Image acquisition challenging due to patient body habitus. Patient non verbal, could not turn. Patient in pain. IMPRESSIONS  1. Study is nondiagnostic due to very poor echo windows and poor visualization.  2. Left ventricular ejection fraction, by estimation, is 70 to 75%. The left ventricle has hyperdynamic function. Left ventricular endocardial border not optimally defined to evaluate regional wall motion. Left ventricular diastolic parameters are consistent with Grade I diastolic dysfunction (impaired relaxation).  3. Right ventricular systolic function was not well visualized. The right ventricular size is not well visualized. Systolic function appears normal based on very limited views.  4. The mitral valve is grossly normal. Trivial mitral valve regurgitation.  5. The aortic valve was not well visualized. Aortic valve regurgitation is not visualized.  6. The inferior vena cava is normal in size with greater than 50% respiratory variability, suggesting right atrial pressure of 3 mmHg. Comparison(s): No prior Echocardiogram. FINDINGS  Left Ventricle: Left ventricular ejection fraction, by estimation, is 70 to 75%. The left ventricle has hyperdynamic function. Left ventricular endocardial border not optimally defined to evaluate regional wall motion. Definity contrast agent was given IV to delineate the left ventricular endocardial borders. The left ventricular internal cavity size was normal in size.  Suboptimal image quality limits for assessment of left ventricular hypertrophy. Left ventricular diastolic parameters are consistent with Grade I diastolic dysfunction (impaired relaxation). Right Ventricle: The right ventricular size is not well visualized. Right vetricular wall thickness was not well visualized. Right ventricular systolic function was not well visualized. Left Atrium: Left atrial size was normal in size. Right Atrium: Right atrial size was not well visualized. Pericardium: The pericardium was not well visualized. Mitral Valve: The mitral valve is grossly normal. Trivial mitral valve regurgitation. Tricuspid Valve: The tricuspid valve is not well visualized. Tricuspid valve regurgitation is trivial. Aortic Valve: The aortic valve was not well visualized. Aortic valve regurgitation is not visualized. Pulmonic Valve: The pulmonic valve was not well visualized. Aorta: The aortic root and ascending aorta are structurally normal, with no evidence of dilitation. Venous: The inferior vena cava is normal in size with greater than 50% respiratory variability, suggesting right  atrial pressure of 3 mmHg. IAS/Shunts: The interatrial septum was not well visualized.  LEFT VENTRICLE PLAX 2D LVIDd:         3.80 cm   Diastology LVIDs:         2.70 cm   LV e' medial:    5.87 cm/s LV PW:         1.20 cm   LV E/e' medial:  9.7 LV IVS:        1.00 cm   LV e' lateral:   10.00 cm/s LVOT diam:     1.90 cm   LV E/e' lateral: 5.7 LV SV:         65 LV SV Index:   37 LVOT Area:     2.84 cm  RIGHT VENTRICLE            IVC RV S prime:     8.49 cm/s  IVC diam: 1.80 cm LEFT ATRIUM             Index        RIGHT ATRIUM           Index LA diam:        4.00 cm 2.27 cm/m   RA Area:     10.40 cm LA Vol (A2C):   27.1 ml 15.41 ml/m  RA Volume:   20.50 ml  11.66 ml/m LA Vol (A4C):   20.7 ml 11.77 ml/m LA Biplane Vol: 23.7 ml 13.48 ml/m  AORTIC VALVE LVOT Vmax:   163.00 cm/s LVOT Vmean:  104.000 cm/s LVOT VTI:    0.228 m  AORTA Ao  Root diam: 2.60 cm MITRAL VALVE MV Area (PHT): 5.97 cm    SHUNTS MV Decel Time: 127 msec    Systemic VTI:  0.23 m MV E velocity: 57.10 cm/s  Systemic Diam: 1.90 cm MV A velocity: 96.90 cm/s MV E/A ratio:  0.59 Gwyndolyn Kaufman MD Electronically signed by Gwyndolyn Kaufman MD Signature Date/Time: 04/14/2021/12:20:29 PM    Final    VAS [place] LOWER EXTREMITY VENOUS (DVT) (7a-7p)  Result Date: 04/11/2021  Lower Venous DVT Study Patient Name:  DEREK HUNEYCUTT  Date of Exam:   04/11/2021 Medical Rec #: [id number]             Accession #:    [id number] Date of Birth: 22-Jan-1956              Patient Gender: F Patient Age:   52 years Exam Location:  [facility] Procedure:      VAS [place] LOWER EXTREMITY VENOUS (DVT) Referring Phys: Coral Ceo --------------------------------------------------------------------------------  Performing Technologist: Archie Patten RVS  Examination Guidelines: A complete evaluation includes B-mode imaging, spectral Doppler, color Doppler, and power Doppler as needed of all accessible portions of each vessel. Bilateral testing is considered an integral part of a complete examination. Limited examinations for reoccurring indications may be performed as noted. The reflux portion of the exam is performed with the patient in reverse Trendelenburg.  +-----+---------------+---------+-----------+----------+--------------+ RIGHTCompressibilityPhasicitySpontaneityPropertiesThrombus Aging +-----+---------------+---------+-----------+----------+--------------+ CFV  Full           Yes      Yes                                 +-----+---------------+---------+-----------+----------+--------------+   +---------+---------------+---------+-----------+----------+--------------+ LEFT     CompressibilityPhasicitySpontaneityPropertiesThrombus Aging +---------+---------------+---------+-----------+----------+--------------+ CFV      Full           Yes  Yes                                  +---------+---------------+---------+-----------+----------+--------------+ SFJ      Full                                                        +---------+---------------+---------+-----------+----------+--------------+ FV Prox  Full                                                        +---------+---------------+---------+-----------+----------+--------------+ FV Mid   Full                                                        +---------+---------------+---------+-----------+----------+--------------+ FV DistalFull                                                        +---------+---------------+---------+-----------+----------+--------------+ PFV      Full                                                        +---------+---------------+---------+-----------+----------+--------------+ POP      Full           Yes      Yes                                 +---------+---------------+---------+-----------+----------+--------------+ PTV      Full                                                        +---------+---------------+---------+-----------+----------+--------------+ PERO     Full                                                        +---------+---------------+---------+-----------+----------+--------------+     Summary: RIGHT: - No evidence of common femoral vein obstruction.  LEFT: - There is no evidence of deep vein thrombosis in the lower extremity.  - No cystic structure found in the popliteal fossa.  *See table(s) above for measurements and observations. Electronically signed by Monica Martinez MD on 04/11/2021 at 5:13:08 PM.    Final

## 2021-05-08 NOTE — Code Documentation (Signed)
  Patient Name: Lori [name]   MRN: [id number]   Date of Birth/ Sex: 10-May-1955 , female      Admission Date: 04/27/2021  Attending Provider: Toy Baker, MD  Primary Diagnosis: Rhabdomyolysis [M62.82] SOB (shortness of breath) [R06.02] Acute kidney injury [facility]) [N17.9] Traumatic rhabdomyolysis, initial encounter ([place]) [T79.6XXA]   Indication: Pt was in her usual state of health until this AM, when she was noted to be asystole. Code blue was subsequently called. At the time of arrival on scene, ACLS protocol was underway.   Technical Description:  - CPR performance duration:  29 minutes  - Was defibrillation or cardioversion used? No   - Was external pacer placed? No  - Was patient intubated pre/post CPR? Yes   Medications Administered: Y = Yes; Blank = No Amiodarone    Atropine    Calcium  Y  Epinephrine  Y  Lidocaine    Magnesium    Norepinephrine    Phenylephrine    Sodium bicarbonate  Y  Vasopressin    Other Insulin,D50 x3, Lasix    Post CPR evaluation:  - Final Status - Was patient successfully resuscitated ? No   Miscellaneous Information:  - Time of death:  32 AM  - Primary team notified?  Yes  - Family Notified? Yes     Eppie Gibson, MD   04-23-2021, 3:42 AM

## 2021-05-08 NOTE — Procedures (Signed)
Procedure: endotracheal intubation Indication: cardiac arrest Desc:  Code blue called. Responded to code as I was near the pt's room.  CPR in progress.  On 2nd pulse check, pt intubated on 2nd attempt with 3 mac blade, 7.5 ETT. Grade 1 view. Pt with obvious vomitus in her upper airway.  +colorimeter change.  ETT secured to skin with tape by RT.  Code Blue relinquish to Code [place] team.  Kristopher Oppenheim, DO [facility].

## 2021-05-08 NOTE — Progress Notes (Signed)
    OVERNIGHT PROGRESS REPORT  Notified by Nursing Staff for Code in progress. Immediately notified [facility] floor coverage,  Please see MD/DO notes for proceedings.  Notified of Time of Death 39 (Termination of resuscitative efforts by Code Team)  Family was contacted by [place] coverage MD.  Gershon Cull MSNA MSN [place] [facility] Nurse Practitioner [id number]

## 2021-05-08 NOTE — Progress Notes (Signed)
   Palliative Medicine Inpatient Follow Up Note   Chart reviewed. Patient underwent a "code blue" and sadly, was pronounced at 0334.  Palliative care will be available for any additional support needs.  No Charge. ______________________________________________________________________________________ [place] Team Team Cell Phone: [phone] Please utilize secure chat with additional questions, if there is no response within 30 minutes please call the above phone number  Palliative Medicine Team providers are available by phone from 7am to 7pm daily and can be reached through the team cell phone.  Should this patient require assistance outside of these hours, please call the patient's attending physician.

## 2021-05-08 DEATH — deceased
